# Patient Record
Sex: Female | Born: 1940 | Race: Black or African American | Hispanic: No | Smoking: Never smoker
Health system: Southern US, Community
[De-identification: ages and names within clinical notes are randomized; demographics above are authoritative.]

## PROBLEM LIST (undated history)

## (undated) DIAGNOSIS — I10 Essential (primary) hypertension: Secondary | ICD-10-CM

## (undated) DIAGNOSIS — E119 Type 2 diabetes mellitus without complications: Secondary | ICD-10-CM

## (undated) DIAGNOSIS — G629 Polyneuropathy, unspecified: Secondary | ICD-10-CM

## (undated) DIAGNOSIS — M8008XA Age-related osteoporosis with current pathological fracture, vertebra(e), initial encounter for fracture: Secondary | ICD-10-CM

## (undated) DIAGNOSIS — I7300 Raynaud's syndrome without gangrene: Secondary | ICD-10-CM

## (undated) DIAGNOSIS — M329 Systemic lupus erythematosus, unspecified: Secondary | ICD-10-CM

## (undated) DIAGNOSIS — J45909 Unspecified asthma, uncomplicated: Secondary | ICD-10-CM

## (undated) HISTORY — PX: ABDOMINAL HYSTERECTOMY: SHX81

## (undated) HISTORY — DX: Essential (primary) hypertension: I10

## (undated) HISTORY — DX: Polyneuropathy, unspecified: G62.9

## (undated) HISTORY — DX: Age-related osteoporosis with current pathological fracture, vertebra(e), initial encounter for fracture: M80.08XA

## (undated) HISTORY — DX: Type 2 diabetes mellitus without complications: E11.9

## (undated) HISTORY — PX: TONSILLECTOMY: SUR1361

---

## 2012-08-01 DIAGNOSIS — E119 Type 2 diabetes mellitus without complications: Secondary | ICD-10-CM | POA: Insufficient documentation

## 2012-08-01 DIAGNOSIS — I7300 Raynaud's syndrome without gangrene: Secondary | ICD-10-CM | POA: Insufficient documentation

## 2012-08-01 DIAGNOSIS — E785 Hyperlipidemia, unspecified: Secondary | ICD-10-CM | POA: Insufficient documentation

## 2012-08-01 DIAGNOSIS — M329 Systemic lupus erythematosus, unspecified: Secondary | ICD-10-CM | POA: Insufficient documentation

## 2012-08-01 DIAGNOSIS — M25559 Pain in unspecified hip: Secondary | ICD-10-CM | POA: Insufficient documentation

## 2012-08-01 DIAGNOSIS — M21619 Bunion of unspecified foot: Secondary | ICD-10-CM | POA: Insufficient documentation

## 2012-08-01 DIAGNOSIS — R1110 Vomiting, unspecified: Secondary | ICD-10-CM | POA: Insufficient documentation

## 2012-08-01 DIAGNOSIS — H409 Unspecified glaucoma: Secondary | ICD-10-CM | POA: Insufficient documentation

## 2012-08-01 DIAGNOSIS — H269 Unspecified cataract: Secondary | ICD-10-CM | POA: Insufficient documentation

## 2012-08-01 DIAGNOSIS — M25569 Pain in unspecified knee: Secondary | ICD-10-CM | POA: Insufficient documentation

## 2012-08-01 DIAGNOSIS — I10 Essential (primary) hypertension: Secondary | ICD-10-CM | POA: Insufficient documentation

## 2012-08-01 DIAGNOSIS — R112 Nausea with vomiting, unspecified: Secondary | ICD-10-CM | POA: Insufficient documentation

## 2012-08-01 DIAGNOSIS — R350 Frequency of micturition: Secondary | ICD-10-CM | POA: Insufficient documentation

## 2012-08-01 DIAGNOSIS — K449 Diaphragmatic hernia without obstruction or gangrene: Secondary | ICD-10-CM | POA: Insufficient documentation

## 2012-08-01 DIAGNOSIS — J45909 Unspecified asthma, uncomplicated: Secondary | ICD-10-CM | POA: Insufficient documentation

## 2012-08-01 DIAGNOSIS — R197 Diarrhea, unspecified: Secondary | ICD-10-CM | POA: Insufficient documentation

## 2014-05-11 ENCOUNTER — Telehealth: Payer: Self-pay | Admitting: Neurology

## 2014-05-11 NOTE — Telephone Encounter (Signed)
Spoke with Arnette SchaumannWillie--she would like an appt. to see RAS regarding back pain.  Appt. given for 05-17-14 at 1320/fim

## 2014-05-11 NOTE — Telephone Encounter (Signed)
Patient is calling. Patient states she wants to ask you something but would not go into detail. Thank you.

## 2014-05-17 ENCOUNTER — Ambulatory Visit (INDEPENDENT_AMBULATORY_CARE_PROVIDER_SITE_OTHER): Payer: Medicare Other | Admitting: Neurology

## 2014-05-17 ENCOUNTER — Encounter: Payer: Self-pay | Admitting: Neurology

## 2014-05-17 VITALS — BP 108/56 | HR 64 | Resp 16 | Ht 63.0 in | Wt 184.8 lb

## 2014-05-17 DIAGNOSIS — M5442 Lumbago with sciatica, left side: Secondary | ICD-10-CM

## 2014-05-17 DIAGNOSIS — M25512 Pain in left shoulder: Secondary | ICD-10-CM | POA: Diagnosis not present

## 2014-05-17 DIAGNOSIS — Z794 Long term (current) use of insulin: Secondary | ICD-10-CM

## 2014-05-17 DIAGNOSIS — M545 Low back pain, unspecified: Secondary | ICD-10-CM | POA: Insufficient documentation

## 2014-05-17 DIAGNOSIS — M25511 Pain in right shoulder: Secondary | ICD-10-CM | POA: Insufficient documentation

## 2014-05-17 DIAGNOSIS — M5430 Sciatica, unspecified side: Secondary | ICD-10-CM | POA: Diagnosis not present

## 2014-05-17 DIAGNOSIS — S32009S Unspecified fracture of unspecified lumbar vertebra, sequela: Secondary | ICD-10-CM | POA: Diagnosis not present

## 2014-05-17 DIAGNOSIS — S32009A Unspecified fracture of unspecified lumbar vertebra, initial encounter for closed fracture: Secondary | ICD-10-CM | POA: Insufficient documentation

## 2014-05-17 DIAGNOSIS — E119 Type 2 diabetes mellitus without complications: Secondary | ICD-10-CM | POA: Insufficient documentation

## 2014-05-17 DIAGNOSIS — G4700 Insomnia, unspecified: Secondary | ICD-10-CM | POA: Diagnosis not present

## 2014-05-17 DIAGNOSIS — M5441 Lumbago with sciatica, right side: Secondary | ICD-10-CM | POA: Diagnosis not present

## 2014-05-17 MED ORDER — DOXEPIN HCL 10 MG PO CAPS
10.0000 mg | ORAL_CAPSULE | Freq: Every day | ORAL | Status: DC
Start: 2014-05-17 — End: 2020-03-26

## 2014-05-17 MED ORDER — HYDROCODONE-ACETAMINOPHEN 7.5-325 MG PO TABS: 7.5000 | ORAL_TABLET | Freq: Three times a day (TID) | ORAL | Status: DC | PRN

## 2014-05-17 MED ORDER — GABAPENTIN 300 MG PO CAPS: ORAL_CAPSULE | ORAL | Status: DC

## 2014-05-17 NOTE — Progress Notes (Signed)
[facility]  PATIENT: Kerry Brooks DOB: 11-Aug-1940  REFERRING DOCTOR OR PCP:  Brooke Bonito  SOURCE: patient  _________________________________   HISTORICAL  CHIEF COMPLAINT:  Chief Complaint  Patient presents with  . Back Pain    Dx. with compression fx. last year. Unable to recall what level.  She saw Dr. Ala Dach to discuss v'plasty, but decided against it after Dr. Ala Dach told her the mri indicated fx. was healing.  Sts. lower back pain radiating into both buttocks and down posterior aspect of both legs to knees (right worse than left), has been worse over the last month. She just finished Cipro  bid for a uti/fim  . Vertebral Fracture    HISTORY OF PRESENT ILLNESS:  She is a Kerry Brooks is a 74 yo woman with LBP and a recent vertebral fracture.  LBP:  She has a long history of lower back pain right > left.  Pain mostly in the buttocks    Pain will radiate down the posterior legs, especially on the right.   Standing increases the pain.    Resting, sitting reduces the pain. Gabapentin and hydrocodone have helped the pain in the past.    She had acute onset of severe back pain last fall and was found to have a vertebral body fracture.   She started feeling better around the time she was being evaluated for vertebroplasty (Dr. Ala Dach) and opted not to proceed.   Over the next month, pain improved much better and she was left with just the 'old pain' she has had for more years  Left arm/ shoulder pain:   She has had pain in the axilla and arm is sore in deltoid region of shoulder and feels a little weak.   She denies hand pain.   No neck pain.    This pain started several months ago and was worse last month but is still bothering her.     She also had a h/o CTS on that side in the past.     Knee pain:   She sees Dr. Dareen Piano, rheumatology and has had fluid removed from her knee several times  Insomnia:   She reports sleep maintenance more than sleep onset  insomnia, worsening over the past few months.      She takes gabapentin at bedtime.     REVIEW OF SYSTEMS: Constitutional: No fevers, chills, sweats, or change in appetite Eyes: No visual changes, double vision, eye pain Ear, nose and throat: No hearing loss, ear pain, nasal congestion, sore throat Cardiovascular: No chest pain, palpitations Respiratory: No shortness of breath at rest or with exertion.   No wheezes GastrointestinaI: No nausea, vomiting, diarrhea, abdominal pain, fecal incontinence Genitourinary: No dysuria, urinary retention or frequency.  No nocturia. Musculoskeletal: No neck pain.  Notes left shoulder pain and back pain Integumentary: No rash, pruritus, skin lesions Neurological: as above Psychiatric: No depression at this time.  No anxiety Endocrine: No palpitations, diaphoresis, change in appetite, change in weigh or increased thirst Hematologic/Lymphatic: No anemia, purpura, petechiae. Allergic/Immunologic: No itchy/runny eyes, nasal congestion, recent allergic reactions, rashes  ALLERGIES: Allergies  Allergen Reactions  . Propoxyphene     HOME MEDICATIONS:  Current outpatient prescriptions:  .  aspirin EC 81 MG tablet, Take 81 mg by mouth daily., Disp: , Rfl:  .  brinzolamide (AZOPT) 1 % ophthalmic suspension, Frequency:   Dosage:0.0     Instructions:  Note:, Disp: , Rfl:  .  budesonide-formoterol (SYMBICORT) 160-4.5 MCG/ACT inhaler,  Frequency:   Dosage:0.0     Instructions:  Note:, Disp: , Rfl:  .  carvedilol (COREG) 25 MG tablet, Take by mouth., Disp: , Rfl:  .  Cyanocobalamin (VITAMIN B-12 CR) 1000 MCG TBCR, Frequency:   Dosage:0.0     Instructions:  Note:, Disp: , Rfl:  .  diclofenac sodium (VOLTAREN) 1 % GEL, Frequency:   Dosage:0.0     Instructions:  Note:, Disp: , Rfl:  .  Ergocalciferol (VITAMIN D2) 2000 UNITS TABS, Frequency:   Dosage:0     Instructions:  Note:take 5000 iu everyday, Disp: , Rfl:  .  fluticasone (FLONASE) 50 MCG/ACT nasal spray,  Frequency:   Dosage:0.0     Instructions:  Note:, Disp: , Rfl:  .  gabapentin (NEURONTIN) 300 MG capsule, , Disp: , Rfl:  .  glimepiride (AMARYL) 4 MG tablet, Take by mouth., Disp: , Rfl:  .  HYDROcodone-acetaminophen (NORCO) 7.5-325 MG per tablet, Take 7.5 tablets by mouth 3 (three) times daily as needed., Disp: , Rfl:  .  insulin glargine (LANTUS) 100 UNIT/ML injection, Inject into the skin., Disp: , Rfl:  .  losartan-hydrochlorothiazide (HYZAAR) 100-12.5 MG per tablet, Take by mouth., Disp: , Rfl:  .  metFORMIN (GLUCOPHAGE) 500 MG tablet, Take by mouth., Disp: , Rfl:  .  Multiple Vitamin (MULTIVITAMIN) capsule, Take by mouth., Disp: , Rfl:  .  pantoprazole (PROTONIX) 40 MG tablet, Take 40 mg by mouth., Disp: , Rfl:  .  potassium chloride (K-DUR) 10 MEQ tablet, Take by mouth., Disp: , Rfl:  .  pravastatin (PRAVACHOL) 40 MG tablet, Take by mouth., Disp: , Rfl:  .  predniSONE (DELTASONE) 5 MG tablet, Take by mouth., Disp: , Rfl:   PAST MEDICAL HISTORY: Past Medical History  Diagnosis Date  . Hypertension   . Vertebral fracture, osteoporotic   . Neuropathy   . Diabetes mellitus without complication     PAST SURGICAL HISTORY: History reviewed. No pertinent past surgical history.  FAMILY HISTORY: History reviewed. No pertinent family history.  SOCIAL HISTORY:  History   Social History  . Marital Status: Divorced    Spouse Name: N/A  . Number of Children: N/A  . Years of Education: N/A   Occupational History  . Not on file.   Social History Main Topics  . Smoking status: Never Smoker   . Smokeless tobacco: Not on file  . Alcohol Use: No  . Drug Use: No  . Sexual Activity: Not on file   Other Topics Concern  . Not on file   Social History Narrative  . No narrative on file     PHYSICAL EXAM  Filed Vitals:   05/17/14 1322  BP: 108/56  Pulse: 64  Resp: 16  Height: 5\' 3"  (1.6 m)  Weight: 184 lb 12.8 oz (83.825 kg)    Body mass index is 32.74  kg/(m^2).   General: The patient is well-developed and well-nourished and in no acute distress  Neck: The neck is supple, no carotid bruits are noted.  The neck is nontender.  Cardiovascular: The heart has a regular rate and rhythm with a normal S1 and S2. There were no murmurs, gallops or rubs.   Skin: Extremities are without significant edema.  Musculoskeletal:  Back is tender over both piriformis muscles > lumbar paraspinals.  Tender over left subacromial bursa and ext rotation of shoulder increases pain  Neurologic Exam  Mental status: The patient is alert and oriented x 3 at the time of the examination. The patient has  apparent normal recent and remote memory, with an apparently normal attention span and concentration ability.   Speech is normal.  Cranial nerves: Extraocular movements are full.   Facial symmetry is present. There is good facial sensation to soft touch bilaterally.Facial strength is normal.  Trapezius and sternocleidomastoid strength is normal. No dysarthria is noted.  The tongue is midline, and the patient has symmetric elevation of the soft palate. No obvious hearing deficits are noted.  Motor:  Muscle bulk is normal.   Tone is normal. Strength is  5 / 5 in all 4 extremities.   Sensory: Sensory testing is intact to touch and vibration sensation in all 4 extremities.  Coordination: Cerebellar testing reveals good finger-nose-finge.  Gait and station: Station is normal.   Gait is normal. Tandem gait is normal. Romberg is negative.   Reflexes: Deep tendon reflexes are symmetric and 1 in arms and absent in legs bilaterally.      DIAGNOSTIC DATA (LABS, IMAGING, TESTING) - I reviewed patient records, labs, notes, testing and imaging myself where available.     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 In the past, she has benefited from trigger point injections and I will inject both of the piriformis muscles. A total of 50 mg depot Medrol in 5 mL Marcaine. Additionally, she has after subacromial bursitis. The left subacromial bursa was injected 30 mg of Depo-Medrol and Marcaine using sterile technique. She tolerated the procedure well.   I will also hydrocodone and gabapentin. She also reports insomnia which has worsened over the last year. She is more an issue with sleep maintenance. I will have her try doxepin 10 mg nightly to see if that can help.  She will return to see me in 3 - 4 months or sooner if she has new or worsening neurologic symptoms.   Kerry Brooks A. Epimenio Foot, MD, PhD 05/17/2014, 1:37 PM Certified in Neurology, Clinical Neurophysiology, Sleep Medicine, Pain Medicine and Neuroimaging  [facility] [address], [address] [place], [place] [address] [phone]

## 2014-05-19 ENCOUNTER — Telehealth: Payer: Self-pay | Admitting: Neurology

## 2014-05-19 NOTE — Telephone Encounter (Signed)
Patient is calling because [facility] [address] [facility] [place] states the patient needs prior authorization for medication doxepin (SINEQUAN) 10 MG capsule. Please call patient and advise. Thank you.

## 2014-05-19 NOTE — Telephone Encounter (Signed)
Patient is calling regarding

## 2014-05-19 NOTE — Telephone Encounter (Signed)
I contacted the pharmacy to obtain Rx Ins info.  [facility] with [facility] ID # [id number].  I contacted ins and provided all clinical info.  Request is currently under review.  Ref Key: [id number].  I called the patient back to advise.  Got no answer.  Left message.

## 2014-05-23 ENCOUNTER — Telehealth: Payer: Self-pay

## 2014-05-23 NOTE — Telephone Encounter (Signed)
[employer]  has approved the request for coverage on Doxepin effective until 02/25/2015 Ref # [id number]. Called patient to advise, got no answer.  Left message.

## 2014-08-05 ENCOUNTER — Telehealth: Payer: Self-pay | Admitting: Neurology

## 2014-08-05 NOTE — Telephone Encounter (Signed)
I called back.  Spoke with [facility] Citizen.  Verified Rx.  They will proceed with order.

## 2014-08-05 NOTE — Telephone Encounter (Signed)
[name] with [facility] is calling to clarify directions on Rx HYDROcodone-acetaminophen (NORCO) 7.5-325 MG per tablet that was written on 05-17-14 for the patient.Thank you.

## 2014-09-14 ENCOUNTER — Encounter: Payer: Self-pay | Admitting: Neurology

## 2014-09-14 ENCOUNTER — Ambulatory Visit (INDEPENDENT_AMBULATORY_CARE_PROVIDER_SITE_OTHER): Payer: Medicare Other | Admitting: Neurology

## 2014-09-14 VITALS — BP 174/86 | HR 68 | Resp 16 | Ht 63.0 in | Wt 184.0 lb

## 2014-09-14 DIAGNOSIS — G4700 Insomnia, unspecified: Secondary | ICD-10-CM | POA: Diagnosis not present

## 2014-09-14 DIAGNOSIS — M25512 Pain in left shoulder: Secondary | ICD-10-CM | POA: Diagnosis not present

## 2014-09-14 DIAGNOSIS — M5430 Sciatica, unspecified side: Secondary | ICD-10-CM

## 2014-09-14 MED ORDER — HYDROCODONE-ACETAMINOPHEN 7.5-325 MG PO TABS: 7.5000 | ORAL_TABLET | Freq: Three times a day (TID) | ORAL | Status: DC | PRN

## 2014-09-14 NOTE — Progress Notes (Signed)
[facility]  PATIENT: Kerry Brooks DOB: Mar 19, 1940  REFERRING DOCTOR OR PCP:  Kerry Brooks  SOURCE: patient  _________________________________   HISTORICAL  CHIEF COMPLAINT:  Chief Complaint  Patient presents with  . Back Pain    Sts. lbp has been worse--radiating down both legs, still worse in the right leg.  She denies neck pain but sts. she has had more left shoulder pain./fim    HISTORY OF PRESENT ILLNESS:  She is a Kerry Brooks is a 74 yo woman with LBP and a recent vertebral fracture.  LBP:  The lower back pain has returned and is right > left, mostly in the buttocks    Pain radiates down the posterior legs, especially on the right.   Standing increases the pain.    Resting, sitting reduces the pain. Gabapentin and hydrocodone have helped the pain in the past.     Left arm/ shoulder pain:   She has had pain in the axilla and shoulder blade region of shoulder and arm feels a little weak.   She denies hand or lower arm pain.   No neck pain.    Subacromial bursa shot only helped a little bit.   Knee pain:   She sees Dr. Dareen Piano, rheumatology and has had fluid removed from her knee several times  Mood/Insomnia:   She has more stress (one brother with metastatic lung ca and another with other health issues).   She reports some sleep maintenance   insomnia, worsening over the past few months.      She takes gabapentin at bedtime.   She has only taken doxepin a few times.    REVIEW OF SYSTEMS: Constitutional: No fevers, chills, sweats, or change in appetite Eyes: No visual changes, double vision, eye pain Ear, nose and throat: No hearing loss, ear pain, nasal congestion, sore throat Cardiovascular: No chest pain, palpitations Respiratory: No shortness of breath at rest or with exertion.   No wheezes GastrointestinaI: No nausea, vomiting, diarrhea, abdominal pain, fecal incontinence Genitourinary: No dysuria, urinary retention or frequency.  No  nocturia. Musculoskeletal: No neck pain.  Notes left shoulder pain and back pain Integumentary: No rash, pruritus, skin lesions Neurological: as above Psychiatric: No depression at this time.  No anxiety Endocrine: No palpitations, diaphoresis, change in appetite, change in weigh or increased thirst Hematologic/Lymphatic: No anemia, purpura, petechiae. Allergic/Immunologic: No itchy/runny eyes, nasal congestion, recent allergic reactions, rashes  ALLERGIES: Allergies  Allergen Reactions  . Propoxyphene     HOME MEDICATIONS:  Current outpatient prescriptions:  .  aspirin EC 81 MG tablet, Take 81 mg by mouth daily., Disp: , Rfl:  .  brinzolamide (AZOPT) 1 % ophthalmic suspension, Frequency:   Dosage:0.0     Instructions:  Note:, Disp: , Rfl:  .  carvedilol (COREG) 25 MG tablet, Take by mouth., Disp: , Rfl:  .  Cyanocobalamin (VITAMIN B-12 CR) 1000 MCG TBCR, Frequency:   Dosage:0.0     Instructions:  Note:, Disp: , Rfl:  .  doxepin (SINEQUAN) 10 MG capsule, Take 1 capsule (10 mg total) by mouth at bedtime., Disp: 305 capsule, Rfl: 5 .  Ergocalciferol (VITAMIN D2) 2000 UNITS TABS, Frequency:   Dosage:0     Instructions:  Note:take 5000 iu everyday, Disp: , Rfl:  .  fluticasone (FLONASE) 50 MCG/ACT nasal spray, Frequency:   Dosage:0.0     Instructions:  Note:, Disp: , Rfl:  .  gabapentin (NEURONTIN) 300 MG capsule, One in am, one in pm and 2  at night, Disp: 360 capsule, Rfl: 3 .  HYDROcodone-acetaminophen (NORCO) 7.5-325 MG per tablet, Take 7.5 tablets by mouth 3 (three) times daily as needed., Disp: 90 tablet, Rfl: 0 .  insulin glargine (LANTUS) 100 UNIT/ML injection, Inject into the skin., Disp: , Rfl:  .  losartan-hydrochlorothiazide (HYZAAR) 100-12.5 MG per tablet, Take by mouth., Disp: , Rfl:  .  metFORMIN (GLUCOPHAGE) 500 MG tablet, Take by mouth., Disp: , Rfl:  .  Multiple Vitamin (MULTIVITAMIN) capsule, Take by mouth., Disp: , Rfl:  .  pantoprazole (PROTONIX) 40 MG tablet, Take 40  mg by mouth., Disp: , Rfl:  .  potassium chloride (K-DUR) 10 MEQ tablet, Take by mouth., Disp: , Rfl:  .  pravastatin (PRAVACHOL) 40 MG tablet, Take by mouth., Disp: , Rfl:  .  predniSONE (DELTASONE) 5 MG tablet, Take by mouth., Disp: , Rfl:  .  budesonide-formoterol (SYMBICORT) 160-4.5 MCG/ACT inhaler, Frequency:   Dosage:0.0     Instructions:  Note:, Disp: , Rfl:  .  diclofenac sodium (VOLTAREN) 1 % GEL, Frequency:   Dosage:0.0     Instructions:  Note:, Disp: , Rfl:  .  glimepiride (AMARYL) 4 MG tablet, Take by mouth., Disp: , Rfl:   PAST MEDICAL HISTORY: Past Medical History  Diagnosis Date  . Hypertension   . Vertebral fracture, osteoporotic   . Neuropathy   . Diabetes mellitus without complication     PAST SURGICAL HISTORY: History reviewed. No pertinent past surgical history.  FAMILY HISTORY: History reviewed. No pertinent family history.  SOCIAL HISTORY:  History   Social History  . Marital Status: Divorced    Spouse Name: N/A  . Number of Children: N/A  . Years of Education: N/A   Occupational History  . Not on file.   Social History Main Topics  . Smoking status: Never Smoker   . Smokeless tobacco: Not on file  . Alcohol Use: No  . Drug Use: No  . Sexual Activity: Not on file   Other Topics Concern  . Not on file   Social History Narrative     PHYSICAL EXAM  Filed Vitals:   09/14/14 1045  BP: 174/86  Pulse: 68  Resp: 16  Height:  (1.6 m)  Weight: 184 lb (83.462 kg)    Body mass index is 32.6 kg/(m^2).   General: The patient is well-developed and well-nourished and in no acute distress  Neck: The neck is supple, no carotid bruits are noted.  The neck is nontender.  Musculoskeletal:  Back is tender over both piriformis muscles > lumbar paraspinals.  Tender over left subacromial bursa and ext rotation of shoulder increases pain  Neurologic Exam  Mental status: The patient is alert and oriented x 3 at the time of the examination. The  patient has apparent normal recent and remote memory, with an apparently normal attention span and concentration ability.   Speech is normal.  Cranial nerves: Extraocular movements are full.   Facial symmetry is present.   Trapezius and sternocleidomastoid strength is normal. No dysarthria is noted.     Motor:  Muscle bulk is normal.   Tone is normal. Strength is  5 / 5 in all 4 extremities.   Sensory: Sensory testing is intact to touch  in all 4 extremities.  Gait and station: Station is normal.   Gait is normal. Tandem gait is normal.    Reflexes: Deep tendon reflexes are symmetric and 1 in arms and absent in legs bilaterally.  DIAGNOSTIC DATA (LABS, IMAGING, TESTING) - I reviewed patient records, labs, notes, testing and imaging myself where available.     ASSESSMENT AND PLAN  Sciatica, unspecified laterality  Pain in joint, shoulder region, left  Insomnia    1.  Trigger point inject  both of the piriformis muscles woth a total of 50 mg depo Medrol in 5 mL Marcaine.  2.  Left subacromial bursa was injected with 30 mg of Depo-Medrol in Marcaine using sterile technique. She tolerated the procedure well.    3.  I will also refill hydrocodone and gabapentin.  4.   She will return to see me in 3 - 4 months or sooner if she has new or worsening neurologic symptoms.   Leondre Taul A. Epimenio FootSater, MD, PhD 09/14/2014, 10:54 AM Certified in Neurology, Clinical Neurophysiology, Sleep Medicine, Pain Medicine and Neuroimaging  [facility] [address], [address] [place], [place] [address] [phone]

## 2014-11-03 ENCOUNTER — Encounter (HOSPITAL_BASED_OUTPATIENT_CLINIC_OR_DEPARTMENT_OTHER): Payer: Self-pay | Admitting: *Deleted

## 2014-11-03 ENCOUNTER — Emergency Department (HOSPITAL_BASED_OUTPATIENT_CLINIC_OR_DEPARTMENT_OTHER)
Admission: EM | Admit: 2014-11-03 | Discharge: 2014-11-03 | Disposition: A | Discharge status: Home / Self Care | Payer: Medicare Other | Attending: Emergency Medicine | Admitting: Emergency Medicine

## 2014-11-03 DIAGNOSIS — E119 Type 2 diabetes mellitus without complications: Secondary | ICD-10-CM | POA: Insufficient documentation

## 2014-11-03 DIAGNOSIS — I10 Essential (primary) hypertension: Secondary | ICD-10-CM | POA: Diagnosis not present

## 2014-11-03 DIAGNOSIS — Z8781 Personal history of (healed) traumatic fracture: Secondary | ICD-10-CM | POA: Diagnosis not present

## 2014-11-03 DIAGNOSIS — M545 Low back pain, unspecified: Secondary | ICD-10-CM

## 2014-11-03 DIAGNOSIS — G629 Polyneuropathy, unspecified: Secondary | ICD-10-CM | POA: Insufficient documentation

## 2014-11-03 DIAGNOSIS — Z7982 Long term (current) use of aspirin: Secondary | ICD-10-CM | POA: Diagnosis not present

## 2014-11-03 LAB — URINALYSIS, ROUTINE W REFLEX MICROSCOPIC
BILIRUBIN URINE: NEGATIVE
Glucose, UA: NEGATIVE mg/dL
HGB URINE DIPSTICK: NEGATIVE
KETONES UR: NEGATIVE mg/dL
Leukocytes, UA: NEGATIVE
NITRITE: NEGATIVE
Protein, ur: NEGATIVE mg/dL
Specific Gravity, Urine: 1.019 (ref 1.005–1.030)
UROBILINOGEN UA: 0.2 mg/dL (ref 0.0–1.0)
pH: 5.5 (ref 5.0–8.0)

## 2014-11-03 MED ORDER — HYDROMORPHONE HCL 1 MG/ML IJ SOLN
1.0000 mg | Freq: Once | INTRAMUSCULAR | Status: AC
  Administered 2014-11-03: 1 mg via INTRAMUSCULAR
  Filled 2014-11-03: qty 1

## 2014-11-03 MED ORDER — CYCLOBENZAPRINE HCL 10 MG PO TABS: 10.0000 mg | ORAL_TABLET | Freq: Two times a day (BID) | ORAL | Status: DC | PRN

## 2014-11-03 NOTE — ED Notes (Signed)
Lower back pain x 3 days. Recent diarrhea that has resolved.

## 2014-11-03 NOTE — ED Notes (Signed)
Patient C/O of right sided back pain x3 days that starts at shoulder and radiates down towards sacral region. Pt has existing back conditions followed by patients primary HCP.

## 2014-11-03 NOTE — ED Provider Notes (Signed)
CSN: [id number]     Arrival date & time 11/03/14  2014 History   This chart was scribed for Mirian Mo, MD by Doreatha Martin, ED Scribe. This patient was seen in room MH10/MH10 and the patient's care was started at 9:54 PM.    Chief Complaint  Patient presents with  . Back Pain   Patient is a 74 y.o. female presenting with back pain. The history is provided by the patient. No language interpreter was used.  Back Pain Location:  Generalized Radiates to:  Does not radiate Pain severity:  Moderate Onset quality:  Gradual Duration:  3 days Progression:  Worsening Chronicity:  Chronic Context: not falling, not jumping from heights, not lifting heavy objects, not physical stress, not recent illness and not recent injury   Relieved by: Hydrocodone  Worsened by:  Deep breathing, lying down, movement and ambulation Associated symptoms: no bladder incontinence, no bowel incontinence, no dysuria, no fever, no numbness, no perianal numbness, no tingling and no weakness     HPI Comments: Kerry Brooks is a 74 y.o. female with Hx of chronic back pain who presents to the Emergency Department complaining of moderate generalized back pain onset 3 days ago and worsened today with associated nausea. Pt states that pain is worsened with breathing, lying flat, movement and mildly relieved with Hydrocodone. Pt reports that she normally gets trigger point injections for her chronic pain from Dr. Leilani Merl. Pt notes a recent UTI a few weeks ago. She states she has one pill left from her antibiotic. Pt denies recent trauma, injury, heavy lifting, falls. She also denies fever, cough, congestion, sore throat, vomiting, dysuria, malodorous urine, numbness or tingling in the BLE, bowel or bladder incontinence worsened from baseline, saddle anesthesia, focal weakness.   Past Medical History  Diagnosis Date  . Hypertension   . Vertebral fracture, osteoporotic   . Neuropathy   . Diabetes mellitus without complication     History reviewed. No pertinent past surgical history. No family history on file. Social History  Substance Use Topics  . Smoking status: Never Smoker   . Smokeless tobacco: None  . Alcohol Use: No   OB History    No data available     Review of Systems  Constitutional: Negative for fever.  HENT: Negative for congestion and sore throat.   Respiratory: Negative for cough.   Gastrointestinal: Positive for nausea. Negative for vomiting and bowel incontinence.  Genitourinary: Negative for bladder incontinence and dysuria.  Musculoskeletal: Positive for back pain.  Neurological: Negative for tingling, weakness and numbness.  All other systems reviewed and are negative.  Allergies  Propoxyphene  Home Medications   Prior to Admission medications   Medication Sig Start Date End Date Taking? Authorizing Provider  aspirin EC 81 MG tablet Take 81 mg by mouth daily. 09/20/11   Historical Provider, MD  brinzolamide (AZOPT) 1 % ophthalmic suspension Frequency:   Dosage:0.0     Instructions:  Note: 09/20/11   Historical Provider, MD  budesonide-formoterol (SYMBICORT) 160-4.5 MCG/ACT inhaler Frequency:   Dosage:0.0     Instructions:  Note: 09/20/11   Historical Provider, MD  carvedilol (COREG) 25 MG tablet Take by mouth. 09/20/11   Historical Provider, MD  Cyanocobalamin (VITAMIN B-12 CR) 1000 MCG TBCR Frequency:   Dosage:0.0     Instructions:  Note: 09/20/11   Historical Provider, MD  cyclobenzaprine (FLEXERIL) 10 MG tablet Take 1 tablet (10 mg total) by mouth 2 (two) times daily as needed for muscle spasms. 11/03/14  Mirian Mo, MD  diclofenac sodium (VOLTAREN) 1 % GEL Frequency:   Dosage:0.0     Instructions:  Note: 12/06/11   Historical Provider, MD  doxepin (SINEQUAN) 10 MG capsule Take 1 capsule (10 mg total) by mouth at bedtime. 05/17/14   Asa Lente, MD  Ergocalciferol (VITAMIN D2) 2000 UNITS TABS Frequency:   Dosage:0     Instructions:  Note:take 5000 iu everyday 12/06/11    Historical Provider, MD  fluticasone (FLONASE) 50 MCG/ACT nasal spray Frequency:   Dosage:0.0     Instructions:  Note: 09/20/11   Historical Provider, MD  gabapentin (NEURONTIN) 300 MG capsule One in am, one in pm and 2 at night 05/17/14   Asa Lente, MD  glimepiride (AMARYL) 4 MG tablet Take by mouth. 09/20/11   Historical Provider, MD  HYDROcodone-acetaminophen (NORCO) 7.5-325 MG per tablet Take 7.5 tablets by mouth 3 (three) times daily as needed. 09/14/14   Asa Lente, MD  insulin glargine (LANTUS) 100 UNIT/ML injection Inject into the skin.    Historical Provider, MD  losartan-hydrochlorothiazide (HYZAAR) 100-12.5 MG per tablet Take by mouth. 05/01/10   Historical Provider, MD  metFORMIN (GLUCOPHAGE) 500 MG tablet Take by mouth. 09/20/11   Historical Provider, MD  Multiple Vitamin (MULTIVITAMIN) capsule Take by mouth. 09/20/11   Historical Provider, MD  pantoprazole (PROTONIX) 40 MG tablet Take 40 mg by mouth.    Historical Provider, MD  potassium chloride (K-DUR) 10 MEQ tablet Take by mouth. 05/01/10   Historical Provider, MD  pravastatin (PRAVACHOL) 40 MG tablet Take by mouth.    Historical Provider, MD  predniSONE (DELTASONE) 5 MG tablet Take by mouth. 09/20/11   Historical Provider, MD   BP 110/79 mmHg  Pulse 83  Temp(Src) 98.2 F (36.8 C) (Oral)  Resp 18  Ht 5' 3.5" (1.613 m)  Wt 184 lb (83.462 kg)  BMI 32.08 kg/m2  SpO2 98% Physical Exam  Constitutional: She is oriented to person, place, and time. She appears well-developed and well-nourished.  HENT:  Head: Normocephalic and atraumatic.  Right Ear: External ear normal.  Left Ear: External ear normal.  Eyes: Conjunctivae and EOM are normal. Pupils are equal, round, and reactive to light.  Neck: Normal range of motion. Neck supple.  Cardiovascular: Normal rate, regular rhythm, normal heart sounds and intact distal pulses.   Pulmonary/Chest: Effort normal and breath sounds normal.  Abdominal: Soft. Bowel sounds are normal.  There is no tenderness.  Musculoskeletal: Normal range of motion.       Lumbar back: She exhibits tenderness and bony tenderness. She exhibits normal range of motion.  Neurological: She is alert and oriented to person, place, and time.  No focal neuro deficits distal to hips  Skin: Skin is warm and dry.  Vitals reviewed.   ED Course  Procedures (including critical care time) DIAGNOSTIC STUDIES: Oxygen Saturation is 100% on RA, normal by my interpretation.    COORDINATION OF CARE: 10:02 PM Discussed treatment plan with pt at bedside and pt agreed to plan.   Labs Review Labs Reviewed  URINALYSIS, ROUTINE W REFLEX MICROSCOPIC (NOT AT [facility])    Imaging Review No results found. I have personally reviewed and evaluated these images and lab results as part of my medical decision-making.   EKG Interpretation None      MDM   Final diagnoses:  Bilateral low back pain without sciatica    74 y.o. female with pertinent PMH of chronic back pain, DM presents with acute on chronic atraumatic  back pain.  Exam consistent with MSK back pain, no signs of sciatica on exam.  NV intact. No abd pain.  UA unremarkable.  DC home with muscle relaxant.  I have reviewed all laboratory and imaging studies if ordered as above  1. Bilateral low back pain without sciatica           Mirian Mo, MD 11/04/14 [phone]

## 2014-11-03 NOTE — Discharge Instructions (Signed)
Back Pain, Adult Low back pain is very common. About 1 in 5 people have back pain.The cause of low back pain is rarely dangerous. The pain often gets better over time.About half of people with a sudden onset of back pain feel better in just 2 weeks. About 8 in 10 people feel better by 6 weeks.  CAUSES Some common causes of back pain include:  Strain of the muscles or ligaments supporting the spine.  Wear and tear (degeneration) of the spinal discs.  Arthritis.  Direct injury to the back. DIAGNOSIS Most of the time, the direct cause of low back pain is not known.However, back pain can be treated effectively even when the exact cause of the pain is unknown.Answering your caregiver's questions about your overall health and symptoms is one of the most accurate ways to make sure the cause of your pain is not dangerous. If your caregiver needs more information, he or she may order lab work or imaging tests (X-rays or MRIs).However, even if imaging tests show changes in your back, this usually does not require surgery. HOME CARE INSTRUCTIONS For many people, back pain returns.Since low back pain is rarely dangerous, it is often a condition that people can learn to manageon their own.   Remain active. It is stressful on the back to sit or stand in one place. Do not sit, drive, or stand in one place for more than 30 minutes at a time. Take short walks on level surfaces as soon as pain allows.Try to increase the length of time you walk each day.  Do not stay in bed.Resting more than 1 or 2 days can delay your recovery.  Do not avoid exercise or work.Your body is made to move.It is not dangerous to be active, even though your back may hurt.Your back will likely heal faster if you return to being active before your pain is gone.  Pay attention to your body when you bend and lift. Many people have less discomfortwhen lifting if they bend their knees, keep the load close to their bodies,and  avoid twisting. Often, the most comfortable positions are those that put less stress on your recovering back.  Find a comfortable position to sleep. Use a firm mattress and lie on your side with your knees slightly bent. If you lie on your back, put a pillow under your knees.  Only take over-the-counter or prescription medicines as directed by your caregiver. Over-the-counter medicines to reduce pain and inflammation are often the most helpful.Your caregiver may prescribe muscle relaxant drugs.These medicines help dull your pain so you can more quickly return to your normal activities and healthy exercise.  Put ice on the injured area.  Put ice in a plastic bag.  Place a towel between your skin and the bag.  Leave the ice on for 15-20 minutes, 03-04 times a day for the first 2 to 3 days. After that, ice and heat may be alternated to reduce pain and spasms.  Ask your caregiver about trying back exercises and gentle massage. This may be of some benefit.  Avoid feeling anxious or stressed.Stress increases muscle tension and can worsen back pain.It is important to recognize when you are anxious or stressed and learn ways to manage it.Exercise is a great option. SEEK MEDICAL CARE IF:  You have pain that is not relieved with rest or medicine.  You have pain that does not improve in 1 week.  You have new symptoms.  You are generally not feeling well. SEEK   IMMEDIATE MEDICAL CARE IF:   You have pain that radiates from your back into your legs.  You develop new bowel or bladder control problems.  You have unusual weakness or numbness in your arms or legs.  You develop nausea or vomiting.  You develop abdominal pain.  You feel faint. Document Released: 02/11/2005 Document Revised: 08/13/2011 Document Reviewed: 06/15/2013 ExitCare Patient Information 2015 [employer]. This information is not intended to replace advice given to you by your health care provider. Make sure you  discuss any questions you have with your health care provider.  

## 2014-11-03 NOTE — ED Notes (Signed)
Room air stat 99% with hr 95 bpm at [facility]

## 2014-11-10 ENCOUNTER — Telehealth: Payer: Self-pay | Admitting: Neurology

## 2014-11-10 NOTE — Telephone Encounter (Signed)
[facility] , [phone] ext: [phone]. They need clarification on rx HYDROcodone-acetaminophen (NORCO) 7.5-325 MG per tablet. Thank you

## 2014-11-10 NOTE — Telephone Encounter (Signed)
I called back.  Got no answer at the extension provided.  Was transferred to a secure VM.  The Rx appears to have instructions of 7.5 tablets three times daily, I assume this is the clarification needed, as 7.5 is the strength, not the number of tablets.  I left a message with this info and asked that they call back if any additional info is needed.

## 2014-12-15 ENCOUNTER — Ambulatory Visit: Payer: Medicare Other | Admitting: Neurology

## 2014-12-16 ENCOUNTER — Encounter: Payer: Self-pay | Admitting: Neurology

## 2014-12-16 ENCOUNTER — Ambulatory Visit (INDEPENDENT_AMBULATORY_CARE_PROVIDER_SITE_OTHER): Payer: Medicare Other | Admitting: Neurology

## 2014-12-16 VITALS — BP 124/62 | HR 68 | Resp 16 | Ht 63.0 in | Wt 184.0 lb

## 2014-12-16 DIAGNOSIS — M25561 Pain in right knee: Secondary | ICD-10-CM

## 2014-12-16 DIAGNOSIS — E119 Type 2 diabetes mellitus without complications: Secondary | ICD-10-CM | POA: Insufficient documentation

## 2014-12-16 DIAGNOSIS — M5442 Lumbago with sciatica, left side: Secondary | ICD-10-CM

## 2014-12-16 DIAGNOSIS — G4700 Insomnia, unspecified: Secondary | ICD-10-CM

## 2014-12-16 DIAGNOSIS — M5441 Lumbago with sciatica, right side: Secondary | ICD-10-CM | POA: Diagnosis not present

## 2014-12-16 DIAGNOSIS — G8929 Other chronic pain: Secondary | ICD-10-CM

## 2014-12-16 DIAGNOSIS — M25512 Pain in left shoulder: Secondary | ICD-10-CM

## 2014-12-16 MED ORDER — HYDROCODONE-ACETAMINOPHEN 7.5-325 MG PO TABS: 7.5000 | ORAL_TABLET | Freq: Three times a day (TID) | ORAL | Status: DC | PRN

## 2014-12-16 NOTE — Progress Notes (Signed)
[facility]  PATIENT: Kerry Brooks DOB: 02/13/1941  REFERRING DOCTOR OR PCP:  Brooke Bonito  SOURCE: patient  _________________________________   HISTORICAL  CHIEF COMPLAINT:  Chief Complaint  Patient presents with  . Back Pain    Sts. lbp has been worse for the last month or so.  Sts. she was seen at [facility] on [address], also seen by pcp--sts. given rx. for ?muscle relaxer that helps some.  Sts. left shoulder pain radiating to left hand continues to be worse./fim  . Left shoulder pain  . Insomnia    HISTORY OF PRESENT ILLNESS:  She is a Kerry Brooks is a 74 yo woman with LBP and a recent vertebral fracture.  LBP:  The lower back pain is bothering her a lot more again.   She went to the [facility] ED in [facility].  She was given a dilaudid shot and placed on Flexeril.   Her benefit was only 12 hours.   The piriformis trigger point injections help x 1 month or so. Current pain right > left, mostly in the buttocks    Pain radiates down the right posterior leg.   Standing increases the pain.    Resting, sitting reduces the pain. Gabapentin and hydrocodone have helped the pain in the past.     Left arm/ shoulder pain:   She has had pain in the left shoulder blade region of shoulder.   She denies hand or lower arm pain.   No neck pain.    Subacromial bursa have helped some in the past.     Knee pain:   She sees Dr. Dareen Piano, rheumatology and has had fluid removed from her knee several times  Mood/Insomnia:   She is sleeping better since starting Doxepin and she tolerates it well  She also takes gabapentin at bedtime.        REVIEW OF SYSTEMS: Constitutional: No fevers, chills, sweats, or change in appetite Eyes: No visual changes, double vision, eye pain Ear, nose and throat: No hearing loss, ear pain, nasal congestion, sore throat Cardiovascular: No chest pain, palpitations Respiratory: No shortness of breath at rest or with exertion.   No  wheezes GastrointestinaI: No nausea, vomiting, diarrhea, abdominal pain, fecal incontinence Genitourinary: No dysuria, urinary retention or frequency.  No nocturia. Musculoskeletal: No neck pain.  Notes left shoulder pain and back pain Integumentary: No rash, pruritus, skin lesions Neurological: as above Psychiatric: No depression at this time.  No anxiety Endocrine: No palpitations, diaphoresis, change in appetite, change in weigh or increased thirst Hematologic/Lymphatic: No anemia, purpura, petechiae. Allergic/Immunologic: No itchy/runny eyes, nasal congestion, recent allergic reactions, rashes  ALLERGIES: Allergies  Allergen Reactions  . Propoxyphene     HOME MEDICATIONS:  Current outpatient prescriptions:  .  aspirin EC 81 MG tablet, Take 81 mg by mouth daily., Disp: , Rfl:  .  brinzolamide (AZOPT) 1 % ophthalmic suspension, Frequency:   Dosage:0.0     Instructions:  Note:, Disp: , Rfl:  .  budesonide-formoterol (SYMBICORT) 160-4.5 MCG/ACT inhaler, Frequency:   Dosage:0.0     Instructions:  Note:, Disp: , Rfl:  .  carvedilol (COREG) 25 MG tablet, Take by mouth., Disp: , Rfl:  .  Cyanocobalamin (VITAMIN B-12 CR) 1000 MCG TBCR, Frequency:   Dosage:0.0     Instructions:  Note:, Disp: , Rfl:  .  cyclobenzaprine (FLEXERIL) 10 MG tablet, Take 1 tablet (10 mg total) by mouth 2 (two) times daily as needed for muscle spasms., Disp:  20 tablet, Rfl: 0 .  doxepin (SINEQUAN) 10 MG capsule, Take 1 capsule (10 mg total) by mouth at bedtime., Disp: 305 capsule, Rfl: 5 .  Ergocalciferol (VITAMIN D2) 2000 UNITS TABS, Frequency:   Dosage:0     Instructions:  Note:take 5000 iu everyday, Disp: , Rfl:  .  fluticasone (FLONASE) 50 MCG/ACT nasal spray, Frequency:   Dosage:0.0     Instructions:  Note:, Disp: , Rfl:  .  gabapentin (NEURONTIN) 300 MG capsule, One in am, one in pm and 2 at night, Disp: 360 capsule, Rfl: 3 .  glimepiride (AMARYL) 4 MG tablet, Take by mouth., Disp: , Rfl:  .   HYDROcodone-acetaminophen (NORCO) 7.5-325 MG per tablet, Take 7.5 tablets by mouth 3 (three) times daily as needed., Disp: 90 tablet, Rfl: 0 .  insulin glargine (LANTUS) 100 UNIT/ML injection, Inject into the skin., Disp: , Rfl:  .  losartan-hydrochlorothiazide (HYZAAR) 100-12.5 MG per tablet, Take by mouth., Disp: , Rfl:  .  metFORMIN (GLUCOPHAGE) 500 MG tablet, Take by mouth., Disp: , Rfl:  .  Multiple Vitamin (MULTIVITAMIN) capsule, Take by mouth., Disp: , Rfl:  .  pantoprazole (PROTONIX) 40 MG tablet, Take 40 mg by mouth., Disp: , Rfl:  .  potassium chloride (K-DUR) 10 MEQ tablet, Take by mouth., Disp: , Rfl:  .  pravastatin (PRAVACHOL) 40 MG tablet, Take by mouth., Disp: , Rfl:  .  predniSONE (DELTASONE) 5 MG tablet, Take by mouth., Disp: , Rfl:  .  diclofenac sodium (VOLTAREN) 1 % GEL, Frequency:   Dosage:0.0     Instructions:  Note:, Disp: , Rfl:  .  PROCTOSOL HC 2.5 % rectal cream, INSERT 1 APPLICATORFUL RECTALLY AT BEDTIME AS NEEDED., Disp: , Rfl: 0  PAST MEDICAL HISTORY: Past Medical History  Diagnosis Date  . Hypertension   . Vertebral fracture, osteoporotic (HCC)   . Neuropathy (HCC)   . Diabetes mellitus without complication (HCC)     PAST SURGICAL HISTORY: History reviewed. No pertinent past surgical history.  FAMILY HISTORY: History reviewed. No pertinent family history.  SOCIAL HISTORY:  Social History   Social History  . Marital Status: Divorced    Spouse Name: N/A  . Number of Children: N/A  . Years of Education: N/A   Occupational History  . Not on file.   Social History Main Topics  . Smoking status: Never Smoker   . Smokeless tobacco: Not on file  . Alcohol Use: No  . Drug Use: No  . Sexual Activity: Not on file   Other Topics Concern  . Not on file   Social History Narrative     PHYSICAL EXAM  Filed Vitals:   12/16/14 1214  BP: 124/62  Pulse: 68  Resp: 16  Height: 5\' 3"  (1.6 m)  Weight: 184 lb (83.462 kg)    Body mass index is  32.6 kg/(m^2).   General: The patient is well-developed and well-nourished and in no acute distress  Neck: The neck is supple, no carotid bruits are noted.  The neck is nontender.  Musculoskeletal:  Back is tender over both piriformis muscles > lumbar paraspinals.  Tender over left AC joint and ext rotation of shoulder increases pain  Neurologic Exam  Mental status: The patient is alert and oriented x 3 at the time of the examination. The patient has apparent normal recent and remote memory, with an apparently normal attention span and concentration ability.   Speech is normal.  Cranial nerves: Extraocular movements are full.   Facial symmetry  is present.   Trapezius and sternocleidomastoid strength is normal. No dysarthria is noted.     Motor:  Muscle bulk is normal.   Tone is normal. Strength is  5 / 5 in all 4 extremities.   Sensory: Sensory testing is intact to touch  in all 4 extremities.  Gait and station: Station is normal.   Gait is normal. Tandem gait is normal.    Reflexes: Deep tendon reflexes are symmetric and 1 in arms and absent in legs bilaterally.      DIAGNOSTIC DATA (LABS, IMAGING, TESTING) - I reviewed patient records, labs, notes, testing and imaging myself where available.     ASSESSMENT AND PLAN  Chronic bilateral low back pain with bilateral sciatica  Pain in joint of left shoulder  Insomnia  Arthralgia of lower leg, right    1.  Trigger point inject  both of the piriformis muscles and the L5 paraspinal muscles with a total of 3 mg Decadron in 8 mL Marcaine.  2.  Left shpoulder AC joint was injected with 1 mg of Decadron in 1.5 cc Marcaine using sterile technique. She tolerated the procedure well.    3.  I will also refill hydrocodone and gabapentin.  4.   She will return to see me in 3 - 4 months or sooner if she has new or worsening neurologic symptoms.   Richard A. Epimenio Foot, MD, PhD 12/16/2014, 12:19 PM Certified in Neurology, Clinical  Neurophysiology, Sleep Medicine, Pain Medicine and Neuroimaging  [facility] [address], [address] [place], [place] [address] [phone]

## 2015-02-03 ENCOUNTER — Telehealth: Payer: Self-pay | Admitting: Neurology

## 2015-02-03 DIAGNOSIS — M25512 Pain in left shoulder: Secondary | ICD-10-CM

## 2015-02-03 MED ORDER — CYCLOBENZAPRINE HCL 5 MG PO TABS: 5.0000 mg | ORAL_TABLET | Freq: Three times a day (TID) | ORAL | Status: DC | PRN

## 2015-02-03 NOTE — Telephone Encounter (Signed)
Pt called sts she barely lift her left arm, pain in side of neck, down arm and into armpit. She is inquiring if a muscle relaxer or whatever Dr Epimenio FootSater thinks. Please call and advise

## 2015-02-03 NOTE — Telephone Encounter (Signed)
I have spoken with [name] again this morning and advised that RAS would like to refer her to ortho, as she received little benefit from left shoulder inj. on 12-16-14.  He will also give her Flexeril 5mg  po tid prn for pain.  She verbalized understanding of same, is agreeable with this plan.  Rx. escribed to [facility] [place] per her request, and referral ordered, requested to [facility] as this is closer to her home./fim

## 2015-02-03 NOTE — Telephone Encounter (Signed)
Pt called and states she is doing great

## 2015-02-03 NOTE — Telephone Encounter (Signed)
I have spoken with [name] again--she had no further questions/fim

## 2015-02-15 DIAGNOSIS — M545 Low back pain: Secondary | ICD-10-CM | POA: Insufficient documentation

## 2015-02-15 DIAGNOSIS — E538 Deficiency of other specified B group vitamins: Secondary | ICD-10-CM | POA: Insufficient documentation

## 2015-02-15 DIAGNOSIS — K219 Gastro-esophageal reflux disease without esophagitis: Secondary | ICD-10-CM | POA: Insufficient documentation

## 2015-02-15 DIAGNOSIS — R809 Proteinuria, unspecified: Secondary | ICD-10-CM | POA: Insufficient documentation

## 2015-02-15 DIAGNOSIS — E11319 Type 2 diabetes mellitus with unspecified diabetic retinopathy without macular edema: Secondary | ICD-10-CM | POA: Insufficient documentation

## 2015-02-15 DIAGNOSIS — M5441 Lumbago with sciatica, right side: Secondary | ICD-10-CM | POA: Insufficient documentation

## 2015-02-15 DIAGNOSIS — M353 Polymyalgia rheumatica: Secondary | ICD-10-CM | POA: Insufficient documentation

## 2015-02-15 DIAGNOSIS — J309 Allergic rhinitis, unspecified: Secondary | ICD-10-CM | POA: Insufficient documentation

## 2015-02-15 DIAGNOSIS — G8929 Other chronic pain: Secondary | ICD-10-CM | POA: Insufficient documentation

## 2015-02-15 DIAGNOSIS — I10 Essential (primary) hypertension: Secondary | ICD-10-CM | POA: Insufficient documentation

## 2015-02-15 DIAGNOSIS — M47812 Spondylosis without myelopathy or radiculopathy, cervical region: Secondary | ICD-10-CM | POA: Insufficient documentation

## 2015-02-15 DIAGNOSIS — J4530 Mild persistent asthma, uncomplicated: Secondary | ICD-10-CM | POA: Insufficient documentation

## 2015-04-25 ENCOUNTER — Ambulatory Visit: Payer: Medicare Other | Admitting: Neurology

## 2015-04-26 ENCOUNTER — Ambulatory Visit (INDEPENDENT_AMBULATORY_CARE_PROVIDER_SITE_OTHER): Payer: Medicare Other | Admitting: Neurology

## 2015-04-26 ENCOUNTER — Encounter: Payer: Self-pay | Admitting: Neurology

## 2015-04-26 VITALS — BP 124/70 | HR 74 | Resp 18 | Ht 63.0 in | Wt 181.0 lb

## 2015-04-26 DIAGNOSIS — M353 Polymyalgia rheumatica: Secondary | ICD-10-CM

## 2015-04-26 DIAGNOSIS — M5441 Lumbago with sciatica, right side: Secondary | ICD-10-CM

## 2015-04-26 DIAGNOSIS — M25512 Pain in left shoulder: Secondary | ICD-10-CM

## 2015-04-26 DIAGNOSIS — G4700 Insomnia, unspecified: Secondary | ICD-10-CM | POA: Diagnosis not present

## 2015-04-26 DIAGNOSIS — M5442 Lumbago with sciatica, left side: Secondary | ICD-10-CM

## 2015-04-26 MED ORDER — HYDROCODONE-ACETAMINOPHEN 7.5-325 MG PO TABS: 7.5000 | ORAL_TABLET | Freq: Three times a day (TID) | ORAL | Status: DC | PRN

## 2015-04-26 MED ORDER — GABAPENTIN 300 MG PO CAPS: ORAL_CAPSULE | ORAL | Status: DC

## 2015-04-26 MED ORDER — DICLOFENAC SODIUM 1 % TD GEL: 2.0000 g | Freq: Three times a day (TID) | TRANSDERMAL | Status: DC

## 2015-04-26 NOTE — Progress Notes (Signed)
[facility]  PATIENT: Kerry Brooks DOB: 07/08/1940  REFERRING DOCTOR OR PCP:  Brooke Bonito  SOURCE: patient  _________________________________   HISTORICAL  CHIEF COMPLAINT:  Chief Complaint  Patient presents with  . Back Pain    Sts. lower back pain has been worse but is not radiating into legs at this time.  Sts. left shoulder pain is worse.  She saw Dr. Wyline Mood at [facility]; sts. was told her shoulder is too bad for surgery. Kerry Brooks  . Left Shoulder Pain    HISTORY OF PRESENT ILLNESS:  She is a Kerry Brooks is a 75 yo woman with LBP and a recent vertebral fracture.  LBP:  Her LBP/buttock pain is flaring back up again.   The piriformis trigger point injections help x 1 month or so. Current pain is bilateral, mostly in the buttocks    Pain radiates down the right posterior leg.   Standing increases the pain.    Resting, sitting reduces the pain. Gabapentin and hydrocodone have helped the pain in the past.     Left arm/ shoulder pain:   She has had pain in the left shoulder.   She saw Dr. Wyline Mood and he felt she was not a surgical candidate.   She denies hand or lower arm pain.   No neck pain.    Subacromial bursa have helped some in the past.     Right foot pain:   She has recent tendinitis and saw Dr. Romualdo Bolk (Podiatry) and had an injection  Knee pain:   She sees rheumatology and has had fluid removed from her knee several times., most recently Dr. Hubbard Robinson in [place].     Mood/Insomnia:   She is sleeping better since starting Doxepin and she tolerates it well  She also takes gabapentin at bedtime.        REVIEW OF SYSTEMS: Constitutional: No fevers, chills, sweats, or change in appetite Eyes: No visual changes, double vision, eye pain Ear, nose and throat: No hearing loss, ear pain, nasal congestion, sore throat Cardiovascular: No chest pain, palpitations Respiratory: No shortness of breath at rest or with exertion.   No  wheezes GastrointestinaI: No nausea, vomiting, diarrhea, abdominal pain, fecal incontinence Genitourinary: No dysuria, urinary retention or frequency.  No nocturia. Musculoskeletal: No neck pain.  Notes left shoulder pain and back pain Integumentary: No rash, pruritus, skin lesions Neurological: as above Psychiatric: No depression at this time.  No anxiety Endocrine: No palpitations, diaphoresis, change in appetite, change in weigh or increased thirst Hematologic/Lymphatic: No anemia, purpura, petechiae. Allergic/Immunologic: No itchy/runny eyes, nasal congestion, recent allergic reactions, rashes  ALLERGIES: Allergies  Allergen Reactions  . Canagliflozin Other (See Comments)    Other reaction(s): Other (See Comments) unknown unknown  . Propoxyphene Other (See Comments)    unknown    HOME MEDICATIONS:  Current outpatient prescriptions:  .  aspirin EC 81 MG tablet, Take 81 mg by mouth daily., Disp: , Rfl:  .  brinzolamide (AZOPT) 1 % ophthalmic suspension, Frequency:   Dosage:0.0     Instructions:  Note:, Disp: , Rfl:  .  budesonide-formoterol (SYMBICORT) 160-4.5 MCG/ACT inhaler, Frequency:   Dosage:0.0     Instructions:  Note:, Disp: , Rfl:  .  carvedilol (COREG) 25 MG tablet, Take by mouth., Disp: , Rfl:  .  Cyanocobalamin (VITAMIN B-12 CR) 1000 MCG TBCR, Frequency:   Dosage:0.0     Instructions:  Note:, Disp: , Rfl:  .  cyclobenzaprine (FLEXERIL) 5 MG tablet, Take  1 tablet (5 mg total) by mouth 3 (three) times daily as needed for muscle spasms., Disp: 90 tablet, Rfl: 0 .  diclofenac sodium (VOLTAREN) 1 % GEL, Frequency:   Dosage:0.0     Instructions:  Note:, Disp: , Rfl:  .  doxepin (SINEQUAN) 10 MG capsule, Take 1 capsule (10 mg total) by mouth at bedtime., Disp: 305 capsule, Rfl: 5 .  Ergocalciferol (VITAMIN D2) 2000 UNITS TABS, Frequency:   Dosage:0     Instructions:  Note:take 5000 iu everyday, Disp: , Rfl:  .  fluticasone (FLONASE) 50 MCG/ACT nasal spray, Frequency:    Dosage:0.0     Instructions:  Note:, Disp: , Rfl:  .  gabapentin (NEURONTIN) 300 MG capsule, One in am, one in pm and 2 at night, Disp: 360 capsule, Rfl: 3 .  glimepiride (AMARYL) 4 MG tablet, Take by mouth., Disp: , Rfl:  .  HYDROcodone-acetaminophen (NORCO) 7.5-325 MG tablet, Take 7.5 tablets by mouth 3 (three) times daily as needed., Disp: 90 tablet, Rfl: 0 .  insulin glargine (LANTUS) 100 UNIT/ML injection, Inject into the skin., Disp: , Rfl:  .  losartan-hydrochlorothiazide (HYZAAR) 100-12.5 MG per tablet, Take by mouth., Disp: , Rfl:  .  metFORMIN (GLUCOPHAGE) 500 MG tablet, Take by mouth., Disp: , Rfl:  .  Multiple Vitamin (MULTIVITAMIN) capsule, Take by mouth., Disp: , Rfl:  .  pantoprazole (PROTONIX) 40 MG tablet, Take 40 mg by mouth., Disp: , Rfl:  .  potassium chloride (K-DUR) 10 MEQ tablet, Take by mouth., Disp: , Rfl:  .  pravastatin (PRAVACHOL) 40 MG tablet, Take by mouth., Disp: , Rfl:  .  predniSONE (DELTASONE) 5 MG tablet, Take by mouth., Disp: , Rfl:  .  PROCTOSOL HC 2.5 % rectal cream, INSERT 1 APPLICATORFUL RECTALLY AT BEDTIME AS NEEDED., Disp: , Rfl: 0  PAST MEDICAL HISTORY: Past Medical History  Diagnosis Date  . Hypertension   . Vertebral fracture, osteoporotic (HCC)   . Neuropathy (HCC)   . Diabetes mellitus without complication (HCC)     PAST SURGICAL HISTORY: History reviewed. No pertinent past surgical history.  FAMILY HISTORY: History reviewed. No pertinent family history.  SOCIAL HISTORY:  Social History   Social History  . Marital Status: Divorced    Spouse Name: N/A  . Number of Children: N/A  . Years of Education: N/A   Occupational History  . Not on file.   Social History Main Topics  . Smoking status: Never Smoker   . Smokeless tobacco: Not on file  . Alcohol Use: No  . Drug Use: No  . Sexual Activity: Not on file   Other Topics Concern  . Not on file   Social History Narrative     PHYSICAL EXAM  Filed Vitals:   04/26/15  1111  BP: 124/70  Pulse: 74  Resp: 18  Height:  (1.6 m)  Weight: 181 lb (82.101 kg)    Body mass index is 32.07 kg/(m^2).   General: The patient is well-developed and well-nourished and in no acute distress  Neck: The neck is supple, no carotid bruits are noted.  The neck is nontender.  Musculoskeletal:  Back is tender over both piriformis muscles > lumbar paraspinals.  Tender over left AC joint and ext rotation of shoulder increases pain  Neurologic Exam  Mental status: The patient is alert and oriented x 3 at the time of the examination. The patient has apparent normal recent and remote memory, with an apparently normal attention span and concentration ability.  Speech is normal.  Cranial nerves: Extraocular movements are full.   Facial symmetry is present.   Trapezius and sternocleidomastoid strength is normal. No dysarthria is noted.     Motor:  Muscle bulk is normal.   Tone is normal. Strength is  5 / 5 in all 4 extremities.   Sensory: Sensory testing is intact to touch  in all 4 extremities.  Gait and station: Station is normal.   Gait is normal. Tandem gait is normal.    Reflexes: Deep tendon reflexes are symmetric and 1 in arms and absent in legs bilaterally.      DIAGNOSTIC DATA (LABS, IMAGING, TESTING) - I reviewed patient records, labs, notes, testing and imaging myself where available.     ASSESSMENT AND PLAN  Bilateral low back pain with sciatica, sciatica laterality unspecified  Pain in joint of left shoulder  Polymyalgia rheumatica (HCC)  Insomnia    1.  Trigger point inject  both of the piriformis muscles and the L5 paraspinal muscles with 60 mg depo-medrol in 6 mL Marcaine.  2.  Left shoulder AC joint was injected with 20 mg Depo-Medrol in 2 ml Marcaine using sterile technique. She tolerated the procedure well.    3.  Refill hydrocodone and gabapentin.  4.   She will return to see me in 3 - 4 months or sooner if she has new or worsening  neurologic symptoms.   Zell Doucette A. Epimenio Foot, MD, PhD 04/26/2015, 11:57 AM Certified in Neurology, Clinical Neurophysiology, Sleep Medicine, Pain Medicine and Neuroimaging  [facility] [address], [address] [place], [place] [address] [phone]

## 2015-06-19 DIAGNOSIS — M25569 Pain in unspecified knee: Secondary | ICD-10-CM | POA: Insufficient documentation

## 2015-06-20 DIAGNOSIS — R300 Dysuria: Secondary | ICD-10-CM | POA: Insufficient documentation

## 2015-06-20 DIAGNOSIS — Z79899 Other long term (current) drug therapy: Secondary | ICD-10-CM | POA: Insufficient documentation

## 2015-08-22 DIAGNOSIS — M2040 Other hammer toe(s) (acquired), unspecified foot: Secondary | ICD-10-CM | POA: Insufficient documentation

## 2015-08-22 DIAGNOSIS — M2010 Hallux valgus (acquired), unspecified foot: Secondary | ICD-10-CM | POA: Insufficient documentation

## 2015-08-24 ENCOUNTER — Ambulatory Visit (INDEPENDENT_AMBULATORY_CARE_PROVIDER_SITE_OTHER): Payer: Medicare Other | Admitting: Neurology

## 2015-08-24 ENCOUNTER — Encounter: Payer: Self-pay | Admitting: Neurology

## 2015-08-24 VITALS — BP 118/62 | HR 68 | Resp 18 | Ht 63.0 in | Wt 189.5 lb

## 2015-08-24 DIAGNOSIS — M353 Polymyalgia rheumatica: Secondary | ICD-10-CM

## 2015-08-24 DIAGNOSIS — M25512 Pain in left shoulder: Secondary | ICD-10-CM | POA: Diagnosis not present

## 2015-08-24 DIAGNOSIS — M5441 Lumbago with sciatica, right side: Secondary | ICD-10-CM | POA: Diagnosis not present

## 2015-08-24 DIAGNOSIS — M5430 Sciatica, unspecified side: Secondary | ICD-10-CM

## 2015-08-24 DIAGNOSIS — M5442 Lumbago with sciatica, left side: Secondary | ICD-10-CM | POA: Diagnosis not present

## 2015-08-24 MED ORDER — HYDROCODONE-ACETAMINOPHEN 7.5-325 MG PO TABS: 7.5000 | ORAL_TABLET | Freq: Three times a day (TID) | ORAL | Status: DC | PRN

## 2015-08-24 NOTE — Progress Notes (Signed)
[facility]  PATIENT: Kerry Brooks DOB: 1940-08-29  REFERRING DOCTOR OR PCP:  Kerry Brooks  SOURCE: patient  _________________________________   HISTORICAL  CHIEF COMPLAINT:  Chief Complaint  Patient presents with  . Back Pain    Sts. lbp has been worse, occasionally radiating down both legs, left worse than right. Also sts. Kerry Brooks is having more bilat. knee pain.  Kerry Brooks is on Prednisone 10mg  po qd right now, rx'd by pcp/fim  . Left Shoulder Pain  . Bilateral Knee Pain    HISTORY OF PRESENT ILLNESS:  Kerry Brooks is a Kerry Brooks is a 75 yo woman with LBP, left shoulder pain and knee pain.  LBP:  Her LBP/buttock pain improved after piriformis muscle injections in March 2017.  Over the last few weeks, pan is increasing again.  Current pain is bilateral, mostly in the buttocks    Pain radiates down the right posterior leg.   Standing increases the pain.    Resting, sitting reduces the pain. Gabapentin and hydrocodone have helped the pain in the past.   Kerry Brooks takes 1-2 hydrocodones a day if pain flares up  Left arm/ shoulder pain:   Kerry Brooks reports pain in the left shoulder. Kerry Brooks denies hand or lower arm pain.   Kerry Brooks seldom has neck pain.     Subacromial bursa have helped in the past when pain was worse.  Kerry Brooks saw Dr. Wyline Brooks and he felt Kerry Brooks was not a surgical candidate.      PMR/Knee pain:   Kerry Brooks sees rheumatology and has had fluid removed from her knee several times., most recently Dr. Hubbard Brooks in [place].   Kerry Brooks is on prednisone for the PMR   Mood/Insomnia:   Kerry Brooks is sleeping better since starting Doxepin and Kerry Brooks tolerates it well  Kerry Brooks also takes gabapentin at bedtime.        REVIEW OF SYSTEMS: Constitutional: No fevers, chills, sweats, or change in appetite Eyes: No visual changes, double vision, eye pain Ear, nose and throat: No hearing loss, ear pain, nasal congestion, sore throat Cardiovascular: No chest pain, palpitations Respiratory: No shortness of breath at rest or  with exertion.   No wheezes GastrointestinaI: No nausea, vomiting, diarrhea, abdominal pain, fecal incontinence Genitourinary: No dysuria, urinary retention or frequency.  No nocturia. Musculoskeletal: No neck pain.  Notes left shoulder pain and back pain Integumentary: No rash, pruritus, skin lesions Neurological: as above Psychiatric: No depression at this time.  No anxiety Endocrine: No palpitations, diaphoresis, change in appetite, change in weigh or increased thirst Hematologic/Lymphatic: No anemia, purpura, petechiae. Allergic/Immunologic: No itchy/runny eyes, nasal congestion, recent allergic reactions, rashes  ALLERGIES: Allergies  Allergen Reactions  . Canagliflozin Other (See Comments)    Other reaction(s): Other (See Comments) unknown unknown  . Propoxyphene Other (See Comments)    unknown    HOME MEDICATIONS:  Current outpatient prescriptions:  .  aspirin EC 81 MG tablet, Take 81 mg by mouth daily., Disp: , Rfl:  .  brinzolamide (AZOPT) 1 % ophthalmic suspension, Frequency:   Dosage:0.0     Instructions:  Note:, Disp: , Rfl:  .  budesonide-formoterol (SYMBICORT) 160-4.5 MCG/ACT inhaler, Frequency:   Dosage:0.0     Instructions:  Note:, Disp: , Rfl:  .  carvedilol (COREG) 25 MG tablet, Take by mouth., Disp: , Rfl:  .  Cyanocobalamin (VITAMIN B-12 CR) 1000 MCG TBCR, Frequency:   Dosage:0.0     Instructions:  Note:, Disp: , Rfl:  .  cyclobenzaprine (FLEXERIL) 5 MG tablet, Take  1 tablet (5 mg total) by mouth 3 (three) times daily as needed for muscle spasms., Disp: 90 tablet, Rfl: 0 .  diclofenac sodium (VOLTAREN) 1 % GEL, Apply 2 g topically 3 (three) times daily. Frequency:   Dosage:0.0     Instructions:  Note:, Disp: 400 g, Rfl: 3 .  doxepin (SINEQUAN) 10 MG capsule, Take 1 capsule (10 mg total) by mouth at bedtime., Disp: 305 capsule, Rfl: 5 .  Ergocalciferol (VITAMIN D2) 2000 UNITS TABS, Frequency:   Dosage:0     Instructions:  Note:take 5000 iu everyday, Disp: , Rfl:   .  fluticasone (FLONASE) 50 MCG/ACT nasal spray, Frequency:   Dosage:0.0     Instructions:  Note:, Disp: , Rfl:  .  gabapentin (NEURONTIN) 300 MG capsule, One in am, one in pm and 2 at night, Disp: 360 capsule, Rfl: 3 .  glimepiride (AMARYL) 4 MG tablet, Take by mouth., Disp: , Rfl:  .  HYDROcodone-acetaminophen (NORCO) 7.5-325 MG tablet, Take 7.5 tablets by mouth 3 (three) times daily as needed., Disp: 90 tablet, Rfl: 0 .  insulin glargine (LANTUS) 100 UNIT/ML injection, Inject into the skin., Disp: , Rfl:  .  losartan-hydrochlorothiazide (HYZAAR) 100-12.5 MG per tablet, Take by mouth., Disp: , Rfl:  .  metFORMIN (GLUCOPHAGE) 500 MG tablet, Take by mouth., Disp: , Rfl:  .  Multiple Vitamin (MULTIVITAMIN) capsule, Take by mouth., Disp: , Rfl:  .  pantoprazole (PROTONIX) 40 MG tablet, Take 40 mg by mouth., Disp: , Rfl:  .  potassium chloride (K-DUR) 10 MEQ tablet, Take by mouth., Disp: , Rfl:  .  pravastatin (PRAVACHOL) 40 MG tablet, Take by mouth., Disp: , Rfl:  .  predniSONE (DELTASONE) 5 MG tablet, Take by mouth., Disp: , Rfl:  .  PROCTOSOL HC 2.5 % rectal cream, INSERT 1 APPLICATORFUL RECTALLY AT BEDTIME AS NEEDED., Disp: , Rfl: 0  PAST MEDICAL HISTORY: Past Medical History  Diagnosis Date  . Hypertension   . Vertebral fracture, osteoporotic (HCC)   . Neuropathy (HCC)   . Diabetes mellitus without complication (HCC)     PAST SURGICAL HISTORY: History reviewed. No pertinent past surgical history.  FAMILY HISTORY: History reviewed. No pertinent family history.  SOCIAL HISTORY:  Social History   Social History  . Marital Status: Divorced    Spouse Name: N/A  . Number of Children: N/A  . Years of Education: N/A   Occupational History  . Not on file.   Social History Main Topics  . Smoking status: Never Smoker   . Smokeless tobacco: Not on file  . Alcohol Use: No  . Drug Use: No  . Sexual Activity: Not on file   Other Topics Concern  . Not on file   Social History  Narrative     PHYSICAL EXAM  Filed Vitals:   08/24/15 1059  BP: 118/62  Pulse: 68  Resp: 18  Height: 5\' 3"  (1.6 m)  Weight: 189 lb 8 oz (85.957 kg)    Body mass index is 33.58 kg/(m^2).   General: The patient is well-developed and well-nourished and in no acute distress  Neck: The neck is supple   The neck is nontender.  Musculoskeletal:  Back is tender over both piriformis muscles and lower lumbar paraspinals.    Neurologic Exam  Mental status: The patient is alert and oriented x 3 at the time of the examination. The patient has apparent normal recent and remote memory, with an apparently normal attention span and concentration ability.   Speech  is normal.  Cranial nerves: Extraocular movements are full.   Facial symmetry is present.   Trapezius and sternocleidomastoid strength is normal. No dysarthria is noted.     Motor:  Muscle bulk is normal.   Tone is normal. Strength is  5 / 5 in all 4 extremities.   Sensory: Sensory testing is intact to touch  in all 4 extremities.  Gait and station: Station is normal.   Gait is normal for age. Tandem gait is mildly wide.    Reflexes: Deep tendon reflexes are symmetric and 1 in arms and absent in legs bilaterally.      DIAGNOSTIC DATA (LABS, IMAGING, TESTING) - I reviewed patient records, labs, notes, testing and imaging myself where available.     ASSESSMENT AND PLAN  Bilateral low back pain with sciatica, sciatica laterality unspecified  Sciatica, unspecified laterality  Pain in joint of left shoulder  Polymyalgia rheumatica (HCC)    1.  Trigger point inject  both of the piriformis muscles and the L4L5 paraspinal muscles with 60 mg depo-medrol in 6 mL Marcaine.  2.   Refill hydrocodone and gabapentin.  3   Try to stay active and exercise 4.   Kerry Brooks will return to see me in 3 - 4 months or sooner if Kerry Brooks has new or worsening neurologic symptoms.   Ulysee Fyock A. Epimenio Foot, MD, PhD 08/24/2015, 11:12 AM Certified in  Neurology, Clinical Neurophysiology, Sleep Medicine, Pain Medicine and Neuroimaging  [facility] [address], [address] [place], [place] [address] [phone]

## 2016-01-02 ENCOUNTER — Ambulatory Visit (INDEPENDENT_AMBULATORY_CARE_PROVIDER_SITE_OTHER): Payer: Medicare Other | Admitting: Neurology

## 2016-01-02 ENCOUNTER — Encounter: Payer: Self-pay | Admitting: Neurology

## 2016-01-02 VITALS — BP 174/86 | HR 78 | Resp 18 | Ht 63.0 in | Wt 196.5 lb

## 2016-01-02 DIAGNOSIS — G4700 Insomnia, unspecified: Secondary | ICD-10-CM

## 2016-01-02 DIAGNOSIS — M329 Systemic lupus erythematosus, unspecified: Secondary | ICD-10-CM | POA: Diagnosis not present

## 2016-01-02 DIAGNOSIS — M5432 Sciatica, left side: Secondary | ICD-10-CM

## 2016-01-02 DIAGNOSIS — R32 Unspecified urinary incontinence: Secondary | ICD-10-CM | POA: Diagnosis not present

## 2016-01-02 DIAGNOSIS — M5431 Sciatica, right side: Secondary | ICD-10-CM | POA: Diagnosis not present

## 2016-01-02 MED ORDER — HYDROCODONE-ACETAMINOPHEN 7.5-325 MG PO TABS: 1.0000 | ORAL_TABLET | Freq: Three times a day (TID) | ORAL | 0 refills | Status: DC | PRN

## 2016-01-02 MED ORDER — GABAPENTIN 300 MG PO CAPS: ORAL_CAPSULE | ORAL | 3 refills | Status: DC

## 2016-01-02 NOTE — Progress Notes (Signed)
[facility]  PATIENT: Kerry Brooks DOB: 07/01/1940  REFERRING DOCLamount CrankerOR OR PCP:  Brooke BonitoWarren Gallemore  SOURCE: patient  _________________________________   HISTORICAL  CHIEF COMPLAINT:  Chief Complaint  Patient presents with  . Back Pain    HISTORY OF PRESENT ILLNESS:  Kerry Brooks is a Kerry Brooks is a 75 yo woman with LBP, left shoulder pain and knee pain.     LBP/sciatica:  Kerry Brooks has pain across the back and in the buttocks, left > right and into the left leg at times.    In the past, LBP/buttock pain improved after piriformis muscle injections.  Over the last month, pan is increasing again.  Current pain is bilateral, mostly in the buttocks    Pain radiates down the right posterior leg.   Standing increases the pain.    Resting, sitting reduces the pain. Gabapentin and hydrocodone have helped the pain in the past.   Kerry Brooks takes 1-2 hydrocodones a day if pain flares up  Left arm/ shoulder pain:   Kerry Brooks reports pain in the left shoulder >  neck pain.     Subacromial bursa have helped in the past when pain was worse but this is just minimal pain now.   Lupus/PMR/Knee pain:   Kerry Brooks gets Actemra for Lupus (Dr. Hubbard RobinsonZialkowska in [place]).   Kerry Brooks is also on prednisone 15 mg .    This has caused some weight gain and inc. BP.    Mood/Insomnia:   Kerry Brooks is sleeping better since starting Doxepin and Kerry Brooks tolerates it well  Kerry Brooks also takes gabapentin at bedtime.        Bladder:   Kerry Brooks has bladder pressure and has had some incontinence.  This is similar to UTI Kerry Brooks had earlier in the year.    REVIEW OF SYSTEMS: Constitutional: No fevers, chills, sweats, or change in appetite Eyes: No visual changes, double vision, eye pain Ear, nose and throat: No hearing loss, ear pain, nasal congestion, sore throat Cardiovascular: No chest pain, palpitations Respiratory: No shortness of breath at rest or with exertion.   No wheezes GastrointestinaI: No nausea, vomiting, diarrhea, abdominal pain, fecal  incontinence Genitourinary: No dysuria, urinary retention or frequency.  No nocturia. Musculoskeletal: No neck pain.  Notes left shoulder pain and back pain Integumentary: No rash, pruritus, skin lesions Neurological: as above Psychiatric: No depression at this time.  No anxiety Endocrine: No palpitations, diaphoresis, change in appetite, change in weigh or increased thirst Hematologic/Lymphatic: No anemia, purpura, petechiae. Allergic/Immunologic: No itchy/runny eyes, nasal congestion, recent allergic reactions, rashes  ALLERGIES: Allergies  Allergen Reactions  . Canagliflozin Other (See Comments)    Other reaction(s): Other (See Comments) unknown unknown  . Propoxyphene Other (See Comments)    unknown    HOME MEDICATIONS:  Current Outpatient Prescriptions:  .  aspirin EC 81 MG tablet, Take 81 mg by mouth daily., Disp: , Rfl:  .  brinzolamide (AZOPT) 1 % ophthalmic suspension, Frequency:   Dosage:0.0     Instructions:  Note:, Disp: , Rfl:  .  budesonide-formoterol (SYMBICORT) 160-4.5 MCG/ACT inhaler, Frequency:   Dosage:0.0     Instructions:  Note:, Disp: , Rfl:  .  carvedilol (COREG) 25 MG tablet, Take by mouth., Disp: , Rfl:  .  cephALEXin (KEFLEX) 500 MG capsule, Take 500 mg by mouth., Disp: , Rfl:  .  Cyanocobalamin (VITAMIN B-12 CR) 1000 MCG TBCR, Frequency:   Dosage:0.0     Instructions:  Note:, Disp: , Rfl:  .  cyclobenzaprine (FLEXERIL) 5 MG  tablet, Take 1 tablet (5 mg total) by mouth 3 (three) times daily as needed for muscle spasms., Disp: 90 tablet, Rfl: 0 .  diclofenac sodium (VOLTAREN) 1 % GEL, Apply 2 g topically 3 (three) times daily. Frequency:   Dosage:0.0     Instructions:  Note:, Disp: 400 g, Rfl: 3 .  doxepin (SINEQUAN) 10 MG capsule, Take 1 capsule (10 mg total) by mouth at bedtime., Disp: 305 capsule, Rfl: 5 .  Ergocalciferol (VITAMIN D2) 2000 UNITS TABS, Frequency:   Dosage:0     Instructions:  Note:take 5000 iu everyday, Disp: , Rfl:  .  fluticasone  (FLONASE) 50 MCG/ACT nasal spray, Frequency:   Dosage:0.0     Instructions:  Note:, Disp: , Rfl:  .  gabapentin (NEURONTIN) 300 MG capsule, One in am, one in pm and 2 at night, Disp: 360 capsule, Rfl: 3 .  glimepiride (AMARYL) 4 MG tablet, Take by mouth., Disp: , Rfl:  .  HYDROcodone-acetaminophen (NORCO) 7.5-325 MG tablet, Take 1 tablet by mouth 3 (three) times daily as needed. Fill on or after 02/01/16, Disp: 90 tablet, Rfl: 0 .  insulin glargine (LANTUS) 100 UNIT/ML injection, Inject into the skin., Disp: , Rfl:  .  losartan-hydrochlorothiazide (HYZAAR) 100-12.5 MG per tablet, Take by mouth., Disp: , Rfl:  .  metFORMIN (GLUCOPHAGE) 500 MG tablet, Take by mouth., Disp: , Rfl:  .  methylPREDNISolone acetate (DEPO-MEDROL) 40 MG/ML injection, Inject 40 mg into the muscle., Disp: , Rfl:  .  Multiple Vitamin (MULTIVITAMIN) capsule, Take by mouth., Disp: , Rfl:  .  pantoprazole (PROTONIX) 40 MG tablet, Take 40 mg by mouth., Disp: , Rfl:  .  potassium chloride (K-DUR) 10 MEQ tablet, Take by mouth., Disp: , Rfl:  .  pravastatin (PRAVACHOL) 40 MG tablet, Take by mouth., Disp: , Rfl:  .  predniSONE (DELTASONE) 5 MG tablet, Take by mouth., Disp: , Rfl:  .  PROCTOSOL HC 2.5 % rectal cream, INSERT 1 APPLICATORFUL RECTALLY AT BEDTIME AS NEEDED., Disp: , Rfl: 0 .  Tocilizumab (ACTEMRA IV), Inject into the vein every 30 (thirty) days., Disp: , Rfl:   PAST MEDICAL HISTORY: Past Medical History:  Diagnosis Date  . Diabetes mellitus without complication (HCC)   . Hypertension   . Neuropathy (HCC)   . Vertebral fracture, osteoporotic (HCC)     PAST SURGICAL HISTORY: No past surgical history on file.  FAMILY HISTORY: No family history on file.  SOCIAL HISTORY:  Social History   Social History  . Marital status: Divorced    Spouse name: N/A  . Number of children: N/A  . Years of education: N/A   Occupational History  . Not on file.   Social History Main Topics  . Smoking status: Never Smoker    . Smokeless tobacco: Not on file  . Alcohol use No  . Drug use: No  . Sexual activity: Not on file   Other Topics Concern  . Not on file   Social History Narrative  . No narrative on file     PHYSICAL EXAM  Vitals:   01/02/16 1045  BP: (!) 174/86  Pulse: 78  Resp: 18  Weight: 196 lb 8 oz (89.1 kg)  Height: 5\' 3"  (1.6 m)    Body mass index is 34.81 kg/m.   General: The patient is well-developed and well-nourished and in no acute distress  Neck: The neck is supple   The neck is nontender.  Musculoskeletal:  Kerry Brooks is tender over both piriformis muscles >  lower lumbar paraspinals.  No trochanteric bursa tenderness  Neurologic Exam  Mental status: The patient is alert and oriented x 3 at the time of the examination. The patient has apparent normal recent and remote memory, with an apparently normal attention span and concentration ability.   Speech is normal.  Cranial nerves: Extraocular movements are full.   Facial symmetry is present.   Trapezius and sternocleidomastoid strength is normal. No dysarthria is noted.     Motor:  Muscle bulk is normal.   Tone is normal. Strength is  5 / 5 in all 4 extremities.   Sensory: Sensory testing is intact to touch  in all 4 extremities.  Gait and station: Station is normal.   Gait is normal for age. Tandem gait is mildly wide.    Reflexes: Deep tendon reflexes are symmetric and 1 in arms and absent in legs bilaterally.       DIAGNOSTIC DATA (LABS, IMAGING, TESTING) - I reviewed patient records, labs, notes, testing and imaging myself where available.     ASSESSMENT AND PLAN  Bilateral sciatica  Systemic lupus erythematosus, unspecified SLE type, unspecified organ involvement status (HCC)  Insomnia, unspecified type  Urinary incontinence, unspecified type - Plan: Urinalysis, Routine w reflex microscopic (not at [facility]), Culture, Urine   1.  Trigger point inject both of the piriformis muscles and the L4L5 paraspinal  muscles with 60 mg depo-medrol in 6 mL Marcaine.  2.   Refill hydrocodone and gabapentin.  3  Stay active, exercise and try to lose weight 4.  Check UA and UCx. 5.  Kerry Brooks will return to see me in  4 months or sooner if Kerry Brooks has new or worsening neurologic symptoms.   Richard A. Epimenio FootSater, MD, PhD 01/02/2016, 12:14 PM Certified in Neurology, Clinical Neurophysiology, Sleep Medicine, Pain Medicine and Neuroimaging  [facility] [address], [address] [place], [place] [address] [phone]

## 2016-01-03 LAB — URINALYSIS, ROUTINE W REFLEX MICROSCOPIC
Bilirubin, UA: NEGATIVE
Ketones, UA: NEGATIVE
Nitrite, UA: NEGATIVE
Protein, UA: NEGATIVE
Specific Gravity, UA: 1.016 (ref 1.005–1.030)
Urobilinogen, Ur: 1 mg/dL (ref 0.2–1.0)
pH, UA: 6.5 (ref 5.0–7.5)

## 2016-01-03 LAB — MICROSCOPIC EXAMINATION
CASTS: NONE SEEN /LPF
WBC, UA: >30 /hpf — AB (ref 0–?)

## 2016-01-04 ENCOUNTER — Telehealth: Payer: Self-pay | Admitting: Neurology

## 2016-01-04 LAB — URINE CULTURE

## 2016-01-04 MED ORDER — SULFAMETHOXAZOLE-TRIMETHOPRIM 800-160 MG PO TABS: 1.0000 | ORAL_TABLET | Freq: Two times a day (BID) | ORAL | 0 refills | Status: DC

## 2016-01-04 NOTE — Telephone Encounter (Signed)
The urine culture showed that she had Escherichia coli. It is sensitive to Bactrim and I will call in 14 pills . I spoke to Kerry Brooks and she knows to pick up the prescription.

## 2016-05-09 ENCOUNTER — Ambulatory Visit (INDEPENDENT_AMBULATORY_CARE_PROVIDER_SITE_OTHER): Payer: Medicare Other | Admitting: Neurology

## 2016-05-09 ENCOUNTER — Encounter: Payer: Self-pay | Admitting: Neurology

## 2016-05-09 VITALS — BP 160/76 | HR 89 | Resp 18 | Ht 63.0 in | Wt 200.5 lb

## 2016-05-09 DIAGNOSIS — M5432 Sciatica, left side: Secondary | ICD-10-CM | POA: Diagnosis not present

## 2016-05-09 DIAGNOSIS — M5440 Lumbago with sciatica, unspecified side: Secondary | ICD-10-CM

## 2016-05-09 DIAGNOSIS — M353 Polymyalgia rheumatica: Secondary | ICD-10-CM | POA: Diagnosis not present

## 2016-05-09 DIAGNOSIS — G4700 Insomnia, unspecified: Secondary | ICD-10-CM | POA: Diagnosis not present

## 2016-05-09 DIAGNOSIS — M542 Cervicalgia: Secondary | ICD-10-CM

## 2016-05-09 MED ORDER — HYDROCODONE-ACETAMINOPHEN 7.5-325 MG PO TABS: 1.0000 | ORAL_TABLET | Freq: Three times a day (TID) | ORAL | 0 refills | Status: DC | PRN

## 2016-05-09 NOTE — Progress Notes (Signed)
[facility]  PATIENT: Kerry Brooks DOB: [birth year]  REFERRING DOCTOR OR PCP:  Kerry Brooks  SOURCE: patient  _________________________________   HISTORICAL  CHIEF COMPLAINT:  Chief Complaint  Patient presents with  . Back Pain    Sts. lbp radiating into both legs (left worse than right) has been worse over the last couple of months./fim    HISTORY OF PRESENT ILLNESS:  She is a Kerry Brooks is a 76 yo woman with LBP, left shoulder pain and knee pain.     LBP/sciatica:  She was sick for several months and had increased sciatica, especially on her left.   She reports pain across the lower back and into the L > R buttocks.   In the past, LBP/buttock pain improved after piriformis muscle injections.  Over the last month, pan is increasing again.  Current pain is bilateral, mostly in the buttocks    Pain radiates down the right posterior leg.   Standing increases the pain.    Resting, sitting reduces the pain.    She has noted some dysesthesias in her left leg (from buttock to ankle).   Gabapentin and hydrocodone have helped the pain in the past.   She takes 1-2 hydrocodones a day if pain flares up  Neck/shoulder pain:   She reports mild pain in the neck and left shoulder     Subacromial bursa have helped in the past when pain was worse but this is just minimal pain now.   Lupus/PMR/Knee pain:   She see Kerry Brooks in [place] and is on Actemra for Lupus and prednisone for PMR  DM:  She has DM but denies numbness in her toes.    Mood/Insomnia:   Insomnia has done better with gabapentin and doxepin at bedtime.Marland Kitchen.         REVIEW OF SYSTEMS: Constitutional: No fevers, chills, sweats, or change in appetite Eyes: No visual changes, double vision, eye pain Ear, nose and throat: No hearing loss, ear pain, nasal congestion, sore throat Cardiovascular: No chest pain, palpitations Respiratory: No shortness of breath at rest or with exertion.   No  wheezes GastrointestinaI: No nausea, vomiting, diarrhea, abdominal pain, fecal incontinence Genitourinary: No dysuria, urinary retention or frequency.  No nocturia. Musculoskeletal: No neck pain.  Notes left shoulder pain and back pain Integumentary: No rash, pruritus, skin lesions Neurological: as above Psychiatric: No depression at this time.  No anxiety Endocrine: No palpitations, diaphoresis, change in appetite, change in weigh or increased thirst Hematologic/Lymphatic: No anemia, purpura, petechiae. Allergic/Immunologic: No itchy/runny eyes, nasal congestion, recent allergic reactions, rashes  ALLERGIES: Allergies  Allergen Reactions  . Canagliflozin Other (See Comments)    Other reaction(s): Other (See Comments) unknown unknown  . Propoxyphene Other (See Comments)    unknown    HOME MEDICATIONS:  Current Outpatient Prescriptions:  .  aspirin EC 81 MG tablet, Take 81 mg by mouth daily., Disp: , Rfl:  .  brinzolamide (AZOPT) 1 % ophthalmic suspension, Frequency:   Dosage:0.0     Instructions:  Note:, Disp: , Rfl:  .  budesonide-formoterol (SYMBICORT) 160-4.5 MCG/ACT inhaler, Frequency:   Dosage:0.0     Instructions:  Note:, Disp: , Rfl:  .  carvedilol (COREG) 25 MG tablet, Take by mouth., Disp: , Rfl:  .  doxepin (SINEQUAN) 10 MG capsule, Take 1 capsule (10 mg total) by mouth at bedtime., Disp: 305 capsule, Rfl: 5 .  fluticasone (FLONASE) 50 MCG/ACT nasal spray, Frequency:   Dosage:0.0  Instructions:  Note:, Disp: , Rfl:  .  gabapentin (NEURONTIN) 300 MG capsule, One in am, one in pm and 2 at night, Disp: 360 capsule, Rfl: 3 .  glucose blood (ONE TOUCH ULTRA TEST) test strip, Use 1 test strip to check blood sugar twice daily, Disp: , Rfl:  .  HYDROcodone-acetaminophen (NORCO) 7.5-325 MG tablet, Take 1 tablet by mouth 3 (three) times daily as needed., Disp: 90 tablet, Rfl: 0 .  insulin glargine (LANTUS) 100 UNIT/ML injection, Inject into the skin., Disp: , Rfl:  .   losartan-hydrochlorothiazide (HYZAAR) 100-12.5 MG per tablet, Take by mouth., Disp: , Rfl:  .  metFORMIN (GLUCOPHAGE) 500 MG tablet, Take by mouth., Disp: , Rfl:  .  methylPREDNISolone acetate (DEPO-MEDROL) 40 MG/ML injection, Inject 40 mg into the muscle., Disp: , Rfl:  .  Multiple Vitamin (MULTIVITAMIN) capsule, Take by mouth., Disp: , Rfl:  .  pantoprazole (PROTONIX) 40 MG tablet, Take 40 mg by mouth., Disp: , Rfl:  .  pravastatin (PRAVACHOL) 40 MG tablet, Take by mouth., Disp: , Rfl:  .  predniSONE (DELTASONE) 5 MG tablet, Take by mouth., Disp: , Rfl:  .  Tocilizumab (ACTEMRA IV), Inject into the vein every 30 (thirty) days., Disp: , Rfl:  .  Cyanocobalamin (VITAMIN B-12 CR) 1000 MCG TBCR, Frequency:   Dosage:0.0     Instructions:  Note:, Disp: , Rfl:  .  cyclobenzaprine (FLEXERIL) 5 MG tablet, Take 1 tablet (5 mg total) by mouth 3 (three) times daily as needed for muscle spasms. (Patient not taking: Reported on 05/09/2016), Disp: 90 tablet, Rfl: 0 .  diclofenac sodium (VOLTAREN) 1 % GEL, Apply 2 g topically 3 (three) times daily. Frequency:   Dosage:0.0     Instructions:  Note: (Patient not taking: Reported on 05/09/2016), Disp: 400 g, Rfl: 3 .  Ergocalciferol (VITAMIN D2) 2000 UNITS TABS, Frequency:   Dosage:0     Instructions:  Note:take 5000 iu everyday, Disp: , Rfl:  .  potassium chloride (K-DUR) 10 MEQ tablet, Take by mouth., Disp: , Rfl:  .  PROCTOSOL HC 2.5 % rectal cream, INSERT 1 APPLICATORFUL RECTALLY AT BEDTIME AS NEEDED., Disp: , Rfl: 0  PAST MEDICAL HISTORY: Past Medical History:  Diagnosis Date  . Diabetes mellitus without complication (HCC)   . Hypertension   . Neuropathy (HCC)   . Vertebral fracture, osteoporotic (HCC)     PAST SURGICAL HISTORY: No past surgical history on file.  FAMILY HISTORY: No family history on file.  SOCIAL HISTORY:  Social History   Social History  . Marital status: Divorced    Spouse name: N/A  . Number of children: N/A  . Years of  education: N/A   Occupational History  . Not on file.   Social History Main Topics  . Smoking status: Never Smoker  . Smokeless tobacco: Never Used  . Alcohol use No  . Drug use: No  . Sexual activity: Not on file   Other Topics Concern  . Not on file   Social History Narrative  . No narrative on file     PHYSICAL EXAM  Vitals:   05/09/16 1126  BP: (!) 160/76  Pulse: 89  Resp: 18  Weight: 200 lb 8 oz (90.9 kg)  Height: 5\' 3"  (1.6 m)    Body mass index is 35.52 kg/m.   General: The patient is well-developed and well-nourished and in no acute distress  Neck: The neck is supple   The neck is nontender.  Musculoskeletal:  She  is tender over left piriformis muscles > lower lumbar paraspinals.  No trochanteric bursa tenderness  Neurologic Exam  Mental status: The patient is alert and oriented x 3 at the time of the examination. The patient has apparent normal recent and remote memory, with an apparently normal attention span and concentration ability.   Speech is normal.  Cranial nerves: Extraocular movements are full.   Facial symmetry is present.   Trapezius and sternocleidomastoid strength is normal. No dysarthria is noted.     Motor:  Muscle bulk is normal.   Tone is normal. Strength is  5 / 5 in all 4 extremities.   Sensory: Sensory testing is intact to touch  in all 4 extremities (including toes)  Gait and station: Station is normal.   Gait is mildly arthritic. Tandem gait is wide.    Reflexes: Deep tendon reflexes are symmetric and 1 in arms and absent in legs bilaterally.       DIAGNOSTIC DATA (LABS, IMAGING, TESTING) - I reviewed patient records, labs, notes, testing and imaging myself where available.     ASSESSMENT AND PLAN  Sciatica of left side  Bilateral low back pain with sciatica, sciatica laterality unspecified, unspecified chronicity  Polymyalgia rheumatica (HCC)  Insomnia, unspecified type  Neck pain   1.  Trigger point inject  left  the piriformis muscles and the L4L5 paraspinal muscles with 60 mg depo-medrol in 6 mL Marcaine.  2.   Refill hydrocodone and continue gabapentin.  3   Stay active, exercise and try to lose weight 4.  She will return to see me in  4 months or sooner if she has new or worsening neurologic symptoms.   Richard A. Epimenio Foot, MD, PhD 05/09/2016, 1:00 PM Certified in Neurology, Clinical Neurophysiology, Sleep Medicine, Pain Medicine and Neuroimaging  [facility] [address] [place], [place] [address] [phone] p

## 2016-09-10 ENCOUNTER — Ambulatory Visit (INDEPENDENT_AMBULATORY_CARE_PROVIDER_SITE_OTHER): Payer: Medicare Other | Admitting: Neurology

## 2016-09-10 ENCOUNTER — Encounter: Payer: Self-pay | Admitting: Neurology

## 2016-09-10 DIAGNOSIS — M25512 Pain in left shoulder: Secondary | ICD-10-CM

## 2016-09-10 DIAGNOSIS — M5442 Lumbago with sciatica, left side: Secondary | ICD-10-CM | POA: Diagnosis not present

## 2016-09-10 DIAGNOSIS — G4700 Insomnia, unspecified: Secondary | ICD-10-CM | POA: Diagnosis not present

## 2016-09-10 DIAGNOSIS — M542 Cervicalgia: Secondary | ICD-10-CM

## 2016-09-10 DIAGNOSIS — M5441 Lumbago with sciatica, right side: Secondary | ICD-10-CM

## 2016-09-10 DIAGNOSIS — G8929 Other chronic pain: Secondary | ICD-10-CM | POA: Diagnosis not present

## 2016-09-10 MED ORDER — HYDROCODONE-ACETAMINOPHEN 7.5-325 MG PO TABS: 1.0000 | ORAL_TABLET | Freq: Three times a day (TID) | ORAL | 0 refills | Status: DC | PRN

## 2016-09-10 NOTE — Progress Notes (Signed)
[facility]  PATIENT: Kerry Brooks DOB: 1940/12/13  REFERRING DOCTOR OR PCP:  Brooke BonitoWarren Gallemore  SOURCE: patient  _________________________________   HISTORICAL  CHIEF COMPLAINT:  Chief Complaint  Patient presents with  . Back Pain  . Left Shoulder Pain    HISTORY OF PRESENT ILLNESS:  Kerry KaiserWillie Brooks is a 76 yo woman with LBP, left shoulder pain and knee pain.      Left sided sciatica:   She reports pain in her lower back and buttock and down the left leg   In the past, LBP/buttock pain improved after piriformis muscle injections.  Pain has increased over the last month on the left.  The right back and leg are fine. When intense, the pain will radiate down the left posterior leg.  She has noted some dysesthesias in her left leg (from buttock to ankle).   Gabapentin and hydrocodone have helped the pain in the past.   She takes 1-2 hydrocodones a day if pain flares up  Neck/shoulder pain:   She feels her neck and shoulder pain are not too bad at the current time. In the past, she received benefit from subacromial bursa injections.   Lupus/PMR/Knee pain:   [facility] and is on Actemra for Lupus and prednisone for PMR  DM:  She has DM but denies numbness in her toes.    Mood/Insomnia:   Insomnia has done better with gabapentin and doxepin at bedtime.Marland Kitchen.         REVIEW OF SYSTEMS: Constitutional: No fevers, chills, sweats, or change in appetite Eyes: No visual changes, double vision, eye pain Ear, nose and throat: No hearing loss, ear pain, nasal congestion, sore throat Cardiovascular: No chest pain, palpitations Respiratory: No shortness of breath at rest or with exertion.   No wheezes GastrointestinaI: No nausea, vomiting, diarrhea, abdominal pain, fecal incontinence Genitourinary: No dysuria, urinary retention or frequency.  No nocturia. Musculoskeletal: No neck pain.  Notes left shoulder pain and back pain Integumentary: No rash,  pruritus, skin lesions Neurological: as above Psychiatric: No depression at this time.  No anxiety Endocrine: No palpitations, diaphoresis, change in appetite, change in weigh or increased thirst Hematologic/Lymphatic: No anemia, purpura, petechiae. Allergic/Immunologic: No itchy/runny eyes, nasal congestion, recent allergic reactions, rashes  ALLERGIES: Allergies  Allergen Reactions  . Canagliflozin Other (See Comments)    Other reaction(s): Other (See Comments) unknown unknown  . Propoxyphene Other (See Comments)    unknown    HOME MEDICATIONS:  Current Outpatient Prescriptions:  .  amLODipine (NORVASC) 5 MG tablet, Take 5 mg by mouth., Disp: , Rfl:  .  aspirin EC 81 MG tablet, Take 81 mg by mouth daily., Disp: , Rfl:  .  brinzolamide (AZOPT) 1 % ophthalmic suspension, Frequency:   Dosage:0.0     Instructions:  Note:, Disp: , Rfl:  .  budesonide-formoterol (SYMBICORT) 160-4.5 MCG/ACT inhaler, Frequency:   Dosage:0.0     Instructions:  Note:, Disp: , Rfl:  .  carvedilol (COREG) 25 MG tablet, Take by mouth., Disp: , Rfl:  .  Cyanocobalamin (VITAMIN B-12 CR) 1000 MCG TBCR, Frequency:   Dosage:0.0     Instructions:  Note:, Disp: , Rfl:  .  cyclobenzaprine (FLEXERIL) 5 MG tablet, Take 1 tablet (5 mg total) by mouth 3 (three) times daily as needed for muscle spasms., Disp: 90 tablet, Rfl: 0 .  diclofenac sodium (VOLTAREN) 1 % GEL, Apply 2 g topically 3 (three) times daily. Frequency:   Dosage:0.0  Instructions:  Note:, Disp: 400 g, Rfl: 3 .  doxepin (SINEQUAN) 10 MG capsule, Take 1 capsule (10 mg total) by mouth at bedtime., Disp: 305 capsule, Rfl: 5 .  Ergocalciferol (VITAMIN D2) 2000 UNITS TABS, Frequency:   Dosage:0     Instructions:  Note:take 5000 iu everyday, Disp: , Rfl:  .  fluticasone (FLONASE) 50 MCG/ACT nasal spray, Frequency:   Dosage:0.0     Instructions:  Note:, Disp: , Rfl:  .  gabapentin (NEURONTIN) 300 MG capsule, One in am, one in pm and 2 at night, Disp: 360  capsule, Rfl: 3 .  glucose blood (ONE TOUCH ULTRA TEST) test strip, Use 1 test strip to check blood sugar twice daily, Disp: , Rfl:  .  HYDROcodone-acetaminophen (NORCO) 7.5-325 MG tablet, Take 1 tablet by mouth 3 (three) times daily as needed., Disp: 90 tablet, Rfl: 0 .  insulin glargine (LANTUS) 100 UNIT/ML injection, Inject into the skin., Disp: , Rfl:  .  losartan-hydrochlorothiazide (HYZAAR) 100-12.5 MG per tablet, Take by mouth., Disp: , Rfl:  .  metFORMIN (GLUCOPHAGE) 500 MG tablet, Take by mouth., Disp: , Rfl:  .  methylPREDNISolone acetate (DEPO-MEDROL) 40 MG/ML injection, Inject 40 mg into the muscle., Disp: , Rfl:  .  Multiple Vitamin (MULTIVITAMIN) capsule, Take by mouth., Disp: , Rfl:  .  pantoprazole (PROTONIX) 40 MG tablet, Take 40 mg by mouth., Disp: , Rfl:  .  potassium chloride (K-DUR) 10 MEQ tablet, Take by mouth., Disp: , Rfl:  .  pravastatin (PRAVACHOL) 40 MG tablet, Take by mouth., Disp: , Rfl:  .  predniSONE (DELTASONE) 5 MG tablet, Take by mouth., Disp: , Rfl:  .  PROCTOSOL HC 2.5 % rectal cream, INSERT 1 APPLICATORFUL RECTALLY AT BEDTIME AS NEEDED., Disp: , Rfl: 0 .  Tocilizumab (ACTEMRA IV), Inject into the vein every 30 (thirty) days., Disp: , Rfl:   PAST MEDICAL HISTORY: Past Medical History:  Diagnosis Date  . Diabetes mellitus without complication (HCC)   . Hypertension   . Neuropathy   . Vertebral fracture, osteoporotic (HCC)     PAST SURGICAL HISTORY: No past surgical history on file.  FAMILY HISTORY: No family history on file.  SOCIAL HISTORY:  Social History   Social History  . Marital status: Divorced    Spouse name: N/A  . Number of children: N/A  . Years of education: N/A   Occupational History  . Not on file.   Social History Main Topics  . Smoking status: Never Smoker  . Smokeless tobacco: Never Used  . Alcohol use No  . Drug use: No  . Sexual activity: Not on file   Other Topics Concern  . Not on file   Social History  Narrative  . No narrative on file     PHYSICAL EXAM  There were no vitals filed for this visit.  There is no height or weight on file to calculate BMI.   General: The patient is well-developed and well-nourished and in no acute distress  Neck: The neck is supple   The neck is nontender.  Musculoskeletal:  There is tenderness over the piriformis muscle on the left. There is milder tenderness on the right and at the paraspinal muscle tenderness. The trochanteric bursae are not tender.     The neck and shoulder are nontender.  Neurologic Exam  Mental status: The patient is alert and oriented x 3 at the time of the examination. The patient has apparent normal recent and remote memory, with an apparently  normal attention span and concentration ability.   Speech is normal.  Cranial nerves: Extraocular movements are full.   Facial symmetry is present.   Trapezius and sternocleidomastoid strength is normal. No dysarthria is noted.     Motor:  Muscle bulk is normal.   Tone is normal. Strength is  5 / 5 in all 4 extremities.   Sensory: Sensory testing is intact to touch  in all 4 extremities (including toes)  Gait and station: Station is normal.   Gait is mildly arthritic. Tandem gait is wide but probably normal for age.  Reflexes: Deep tendon reflexes are symmetric and 1 in arms and absent in legs bilaterally.       DIAGNOSTIC DATA (LABS, IMAGING, TESTING) - I reviewed patient records, labs, notes, testing and imaging myself where available.     ASSESSMENT AND PLAN  Chronic bilateral low back pain with bilateral sciatica  Pain in joint of left shoulder  Insomnia, unspecified type  Neck pain   1.    Left piriformis muscle was injected with 60 mg Depo-Medrol in 5 mL Marcaine using sterile technique.. She tolerated the procedure well and there were no complications.   2.   I refilled the hydrocodone and gabapentin and maintain the current dose.  3   Stay active, exercise and  try to lose weight 4.  She will return to see me in 4 months,  sooner if she has new or worsening neurologic symptoms.   Guled Gahan A. Epimenio Foot, MD, PhD 09/10/2016, 5:14 PM Certified in Neurology, Clinical Neurophysiology, Sleep Medicine, Pain Medicine and Neuroimaging  [facility] [address] [place], [place] [address] [phone] p

## 2016-12-30 ENCOUNTER — Telehealth: Payer: Self-pay | Admitting: Neurology

## 2016-12-30 NOTE — Telephone Encounter (Signed)
Spoke with Kerry Brooks this morning.  She is being treated for a UTI.  No fever.  Getting better. Blood sugars are ok--last A1C down to just over 7. Advised ok to come in for tpi's on Thursday as long as she continues to improve./fim

## 2016-12-30 NOTE — Telephone Encounter (Signed)
Patient has an appointment with Dr. Epimenio FootSater this Thursday 01-02-17 and will probobay get an injection. She has a UTI and is on an antibiotic through this Wednesday. Should she keep this appointment.? Please call and advise.

## 2017-01-02 ENCOUNTER — Ambulatory Visit (INDEPENDENT_AMBULATORY_CARE_PROVIDER_SITE_OTHER): Payer: Medicare Other | Admitting: Neurology

## 2017-01-02 ENCOUNTER — Encounter: Payer: Self-pay | Admitting: Neurology

## 2017-01-02 ENCOUNTER — Encounter (INDEPENDENT_AMBULATORY_CARE_PROVIDER_SITE_OTHER): Payer: Self-pay

## 2017-01-02 VITALS — BP 114/70 | HR 78 | Resp 18 | Ht 63.5 in | Wt 206.0 lb

## 2017-01-02 DIAGNOSIS — M5431 Sciatica, right side: Secondary | ICD-10-CM | POA: Diagnosis not present

## 2017-01-02 DIAGNOSIS — M25512 Pain in left shoulder: Secondary | ICD-10-CM

## 2017-01-02 DIAGNOSIS — G4700 Insomnia, unspecified: Secondary | ICD-10-CM

## 2017-01-02 DIAGNOSIS — M5440 Lumbago with sciatica, unspecified side: Secondary | ICD-10-CM

## 2017-01-02 DIAGNOSIS — M5432 Sciatica, left side: Secondary | ICD-10-CM

## 2017-01-02 MED ORDER — DICLOFENAC SODIUM 1 % TD GEL: 2.0000 g | Freq: Three times a day (TID) | TRANSDERMAL | 3 refills | Status: DC

## 2017-01-02 MED ORDER — HYDROCODONE-ACETAMINOPHEN 7.5-325 MG PO TABS: 1.0000 | ORAL_TABLET | Freq: Three times a day (TID) | ORAL | 0 refills | Status: DC | PRN

## 2017-01-02 NOTE — Progress Notes (Signed)
[facility]  PATIENT: Kerry Brooks DOB: [birth year]  REFERRING DOCTOR OR PCP:  Brooke BonitoWarren Gallemore  SOURCE: patient  _________________________________   HISTORICAL  CHIEF COMPLAINT:  Chief Complaint  Patient presents with  . Back Pain    Sts. lbp and left shoulder pain is worse--requests tpi's today if appropriate/fim  . Left Shoulder Pain    HISTORY OF PRESENT ILLNESS:  Kerry Brooks is a 76 yo woman with LBP, left shoulder pain and knee pain.     Update 01/02/2017:    Currently,  the worse pain is across her back.  Pain increases with chores/exertion.    The pain is generally reduced when she is sitting. This is similar to pain that she has had in the past that improved with trigger point injections. When the pain is more severe she is taking the hydrocodone, usually 2 times a day.  She also reprorts pain in her left shoulder.  Pain increases with lifting and external rotation.   The pain is improved if the shoulder is relaxed and she holds her arm into her chest. This is similar to pain that she has experienced in the past that improved with a subacromial bursa injection.  Volltaren gel helped but was expensive  Insomnia has done better with a combination of gabapentin and doxepin. She tolerates these medications well.  -  From 09/10/2016: Left sided sciatica:   She reports pain in her lower back and buttock and down the left leg   In the past, LBP/buttock pain improved after piriformis muscle injections.  Pain has increased over the last month on the left.  The right back and leg are fine. When intense, the pain will radiate down the left posterior leg.  She has noted some dysesthesias in her left leg (from buttock to ankle).   Gabapentin and hydrocodone have helped the pain in the past.   She takes 1-2 hydrocodones a day if pain flares up  Neck/shoulder pain:   She feels her neck and shoulder pain are not too bad at the current time. In the past, she  received benefit from subacromial bursa injections.   Lupus/PMR/Knee pain:   [facility] and [facility] on Actemra for Lupus and prednisone for PMR  DM:  She has DM but denies numbness in her toes.    Mood/Insomnia:   Insomnia has done better with gabapentin and doxepin at bedtime.Marland Kitchen.         REVIEW OF SYSTEMS: Constitutional: No fevers, chills, sweats, or change in appetite Eyes: No visual changes, double vision, eye pain Ear, nose and throat: No hearing loss, ear pain, nasal congestion, sore throat Cardiovascular: No chest pain, palpitations Respiratory: No shortness of breath at rest or with exertion.   No wheezes GastrointestinaI: No nausea, vomiting, diarrhea, abdominal pain, fecal incontinence Genitourinary: No dysuria, urinary retention or frequency.  No nocturia. Musculoskeletal: Mild neck pain.  Notes left shoulder pain and bilat back pain Integumentary: No rash, pruritus, skin lesions Neurological: as above Psychiatric: No depression at this time.  No anxiety Endocrine: No palpitations, diaphoresis, change in appetite, change in weigh or increased thirst Hematologic/Lymphatic: No anemia, purpura, petechiae. Allergic/Immunologic: No itchy/runny eyes, nasal congestion, recent allergic reactions, rashes  ALLERGIES: Allergies  Allergen Reactions  . Canagliflozin Other (See Comments)    Other reaction(s): Other (See Comments) unknown unknown  . Propoxyphene Other (See Comments)    unknown    HOME MEDICATIONS:  Current Outpatient Medications:  .  amLODipine (  NORVASC) 5 MG tablet, Take 5 mg by mouth., Disp: , Rfl:  .  aspirin EC 81 MG tablet, Take 81 mg by mouth daily., Disp: , Rfl:  .  brinzolamide (AZOPT) 1 % ophthalmic suspension, Frequency:   Dosage:0.0     Instructions:  Note:, Disp: , Rfl:  .  carvedilol (COREG) 25 MG tablet, Take by mouth., Disp: , Rfl:  .  Cyanocobalamin (VITAMIN B-12 CR) 1000 MCG TBCR, Frequency:   Dosage:0.0     Instructions:  Note:,  Disp: , Rfl:  .  Ergocalciferol (VITAMIN D2) 2000 UNITS TABS, Frequency:   Dosage:0     Instructions:  Note:take 5000 iu everyday, Disp: , Rfl:  .  fluticasone (FLONASE) 50 MCG/ACT nasal spray, Frequency:   Dosage:0.0     Instructions:  Note:, Disp: , Rfl:  .  gabapentin (NEURONTIN) 300 MG capsule, One in am, one in pm and 2 at night, Disp: 360 capsule, Rfl: 3 .  glucose blood (ONE TOUCH ULTRA TEST) test strip, Use 1 test strip to check blood sugar twice daily, Disp: , Rfl:  .  HYDROcodone-acetaminophen (NORCO) 7.5-325 MG tablet, Take 1 tablet 3 (three) times daily as needed by mouth. Fill on or after 02/01/2017, Disp: 90 tablet, Rfl: 0 .  insulin glargine (LANTUS) 100 UNIT/ML injection, Inject into the skin., Disp: , Rfl:  .  losartan-hydrochlorothiazide (HYZAAR) 100-12.5 MG per tablet, Take by mouth., Disp: , Rfl:  .  metFORMIN (GLUCOPHAGE) 500 MG tablet, Take by mouth., Disp: , Rfl:  .  Multiple Vitamin (MULTIVITAMIN) capsule, Take by mouth., Disp: , Rfl:  .  pantoprazole (PROTONIX) 40 MG tablet, Take 40 mg by mouth., Disp: , Rfl:  .  potassium chloride (K-DUR) 10 MEQ tablet, Take by mouth., Disp: , Rfl:  .  pravastatin (PRAVACHOL) 40 MG tablet, Take by mouth., Disp: , Rfl:  .  predniSONE (DELTASONE) 5 MG tablet, Take by mouth., Disp: , Rfl:  .  PROCTOSOL HC 2.5 % rectal cream, INSERT 1 APPLICATORFUL RECTALLY AT BEDTIME AS NEEDED., Disp: , Rfl: 0 .  Tocilizumab (ACTEMRA IV), Inject into the vein every 30 (thirty) days., Disp: , Rfl:  .  budesonide-formoterol (SYMBICORT) 160-4.5 MCG/ACT inhaler, Frequency:   Dosage:0.0     Instructions:  Note:, Disp: , Rfl:  .  cyclobenzaprine (FLEXERIL) 5 MG tablet, Take 1 tablet (5 mg total) by mouth 3 (three) times daily as needed for muscle spasms. (Patient not taking: Reported on 01/02/2017), Disp: 90 tablet, Rfl: 0 .  diclofenac sodium (VOLTAREN) 1 % GEL, Apply 2 g 3 (three) times daily topically. Frequency:   Dosage:0.0     Instructions:  Note:, Disp: 200 g,  Rfl: 3 .  doxepin (SINEQUAN) 10 MG capsule, Take 1 capsule (10 mg total) by mouth at bedtime. (Patient not taking: Reported on 01/02/2017), Disp: 305 capsule, Rfl: 5 .  methylPREDNISolone acetate (DEPO-MEDROL) 40 MG/ML injection, Inject 40 mg into the muscle., Disp: , Rfl:   PAST MEDICAL HISTORY: Past Medical History:  Diagnosis Date  . Diabetes mellitus without complication (HCC)   . Hypertension   . Neuropathy   . Vertebral fracture, osteoporotic (HCC)     PAST SURGICAL HISTORY: History reviewed. No pertinent surgical history.  FAMILY HISTORY: History reviewed. No pertinent family history.  SOCIAL HISTORY:  Social History   Socioeconomic History  . Marital status: Divorced    Spouse name: Not on file  . Number of children: Not on file  . Years of education: Not on file  .  Highest education level: Not on file  Social Needs  . Financial resource strain: Not on file  . Food insecurity - worry: Not on file  . Food insecurity - inability: Not on file  . Transportation needs - medical: Not on file  . Transportation needs - non-medical: Not on file  Occupational History  . Not on file  Tobacco Use  . Smoking status: Never Smoker  . Smokeless tobacco: Never Used  Substance and Sexual Activity  . Alcohol use: No    Alcohol/week: 0.0 oz  . Drug use: No  . Sexual activity: Not on file  Other Topics Concern  . Not on file  Social History Narrative  . Not on file     PHYSICAL EXAM  Vitals:   01/02/17 1139  BP: 114/70  Pulse: 78  Resp: 18  Weight: 206 lb (93.4 kg)  Height: 5' 3.5" (1.613 m)    Body mass index is 35.92 kg/m.   General: The patient is well-developed and well-nourished and in no acute distress  Neck: The neck is supple   The neck is nontender.  Musculoskeletal:  There is tenderness over the subacromial bursa of the left shoulder. External rotation increases the tenderness. She has tenderness in the neck paraspinal muscles. There is tenderness in  the lower lumbar paraspinal muscles greater than the piriformis muscles.  Neurologic Exam  Mental status: The patient is alert and oriented x 3 at the time of the examination. The patient has apparent normal recent and remote memory, with an apparently normal attention span and concentration ability.   Speech is normal.  Cranial nerves: Extraocular movements are full.   Facial strength and sensation is normal..   Trapezius and sternocleidomastoid strength is normal. No dysarthria is noted.     Motor:  Muscle bulk is normal.   Tone is normal. Strength is  5 / 5 in all 4 extremities.   Sensory: Sensory testing is intact to touch and vib in all 4 extremities   Gait and station: Station is normal.   Gait is mildly arthritic. Tandem gait is mildly wide..  Reflexes: Deep tendon reflexes are symmetric and 1 in arms and absent in legs bilaterally.       DIAGNOSTIC DATA (LABS, IMAGING, TESTING) - I reviewed patient records, labs, notes, testing and imaging myself where available.     ASSESSMENT AND PLAN  Bilateral sciatica  Bilateral low back pain with sciatica, sciatica laterality unspecified, unspecified chronicity  Pain in joint of left shoulder  Insomnia, unspecified type   1.    Bilateral L3-L4 and L4-L5 lumbar paraspinal muscle trigger point injections with 40 mg Depo-Medrol in 5 mL Marcaine using sterile technique.. She tolerated the procedure well and there were no complications.   2.    Left subacromial bursa injection with 40 mg Depo-Medrol in 2.5 mL Marcaine using sterile technique. She tolerated the procedure well and there were no complications. Pain was improved afterwards.  3.  I refilled the hydrocodone and gabapentin and maintain the current dose.  4   Stay active, exercise and try to lose weight 5.  She will return to see me in 4 months,  sooner if she has new or worsening neurologic symptoms.   Richard A. Epimenio FootSater, MD, PhD 01/02/2017, 12:56 PM Certified in Neurology,  Clinical Neurophysiology, Sleep Medicine, Pain Medicine and Neuroimaging  [facility] [address], [address] [place], [place] [address] [phone] p

## 2017-01-20 ENCOUNTER — Other Ambulatory Visit: Payer: Self-pay | Admitting: Neurology

## 2017-05-14 ENCOUNTER — Encounter: Payer: Self-pay | Admitting: Neurology

## 2017-05-14 ENCOUNTER — Other Ambulatory Visit: Payer: Self-pay

## 2017-05-14 ENCOUNTER — Ambulatory Visit (INDEPENDENT_AMBULATORY_CARE_PROVIDER_SITE_OTHER): Payer: Medicare Other | Admitting: Neurology

## 2017-05-14 ENCOUNTER — Encounter (INDEPENDENT_AMBULATORY_CARE_PROVIDER_SITE_OTHER): Payer: Self-pay

## 2017-05-14 VITALS — BP 142/75 | HR 92 | Resp 20 | Ht 63.5 in | Wt 206.0 lb

## 2017-05-14 DIAGNOSIS — M5440 Lumbago with sciatica, unspecified side: Secondary | ICD-10-CM | POA: Diagnosis not present

## 2017-05-14 DIAGNOSIS — M5441 Lumbago with sciatica, right side: Secondary | ICD-10-CM

## 2017-05-14 DIAGNOSIS — G8929 Other chronic pain: Secondary | ICD-10-CM | POA: Diagnosis not present

## 2017-05-14 DIAGNOSIS — M25512 Pain in left shoulder: Secondary | ICD-10-CM | POA: Diagnosis not present

## 2017-05-14 DIAGNOSIS — M5442 Lumbago with sciatica, left side: Secondary | ICD-10-CM | POA: Diagnosis not present

## 2017-05-14 MED ORDER — CYCLOBENZAPRINE HCL 5 MG PO TABS: ORAL_TABLET | ORAL | 5 refills | Status: DC

## 2017-05-14 MED ORDER — HYDROCODONE-ACETAMINOPHEN 7.5-325 MG PO TABS: 1.0000 | ORAL_TABLET | Freq: Three times a day (TID) | ORAL | 0 refills | Status: DC | PRN

## 2017-05-14 NOTE — Progress Notes (Signed)
[facility]  PATIENT: Kerry Brooks DOB: 03/17/1940  REFERRING DOCTOR OR PCP:  Brooke BonitoWarren Gallemore  SOURCE: patient  _________________________________   HISTORICAL  CHIEF COMPLAINT:  Chief Complaint  Patient presents with  . Back Pain    More shoulder and back pain; requesting tpi's today if appropriate/fim    HISTORY OF PRESENT ILLNESS:  Kerry Brooks is a 77 yo woman with LBP, left shoulder pain and knee pain.    Update 05/14/2017: She is reporting more pain in the left shoulder and the right > left buttock.   Shoulder pain is worse with external rotation and elevation.     She does not note arm weakness (but limited by pain).   No radiation of pain into the arm.    The buttock pain is going a little lower but not much into the leg.  She has had generalized muscle aches that worsened when she went on Crestor and improved when she stropped.     Insomnia is better with gabapentin and doxepin  Update 01/02/2017:    Currently,  the worse pain is across her back.  Pain increases with chores/exertion.    The pain is generally reduced when she is sitting. This is similar to pain that she has had in the past that improved with trigger point injections. When the pain is more severe she is taking the hydrocodone, usually 2 times a day.  She also reprorts pain in her left shoulder.  Pain increases with lifting and external rotation.   The pain is improved if the shoulder is relaxed and she holds her arm into her chest. This is similar to pain that she has experienced in the past that improved with a subacromial bursa injection.  Volltaren gel helped but was expensive  Insomnia has done better with a combination of gabapentin and doxepin. She tolerates these medications well.  -  From 09/10/2016: Left sided sciatica:   She reports pain in her lower back and buttock and down the left leg   In the past, LBP/buttock pain improved after piriformis muscle injections.  Pain has  increased over the last month on the left.  The right back and leg are fine. When intense, the pain will radiate down the left posterior leg.  She has noted some dysesthesias in her left leg (from buttock to ankle).   Gabapentin and hydrocodone have helped the pain in the past.   She takes 1-2 hydrocodones a day if pain flares up  Neck/shoulder pain:   She feels her neck and shoulder pain are not too bad at the current time. In the past, she received benefit from subacromial bursa injections.   Lupus/PMR/Knee pain:   [facility] and is on Actemra for Lupus and prednisone for PMR  DM:  She has DM but denies numbness in her toes.    Mood/Insomnia:   Insomnia has done better with gabapentin and doxepin at bedtime.Marland Kitchen.         REVIEW OF SYSTEMS: Constitutional: No fevers, chills, sweats, or change in appetite Eyes: No visual changes, double vision, eye pain Ear, nose and throat: No hearing loss, ear pain, nasal congestion, sore throat Cardiovascular: No chest pain, palpitations Respiratory: No shortness of breath at rest or with exertion.   No wheezes GastrointestinaI: No nausea, vomiting, diarrhea, abdominal pain, fecal incontinence Genitourinary: No dysuria, urinary retention or frequency.  No nocturia. Musculoskeletal: Mild neck pain.  Notes left shoulder pain and bilat back pain  Integumentary: No rash, pruritus, skin lesions Neurological: as above Psychiatric: No depression at this time.  No anxiety Endocrine: No palpitations, diaphoresis, change in appetite, change in weigh or increased thirst Hematologic/Lymphatic: No anemia, purpura, petechiae. Allergic/Immunologic: No itchy/runny eyes, nasal congestion, recent allergic reactions, rashes  ALLERGIES: Allergies  Allergen Reactions  . Canagliflozin Other (See Comments)    Other reaction(s): Other (See Comments) unknown unknown  . Propoxyphene Other (See Comments)    unknown    HOME MEDICATIONS:  Current  Outpatient Medications:  .  amLODipine (NORVASC) 5 MG tablet, Take 5 mg by mouth., Disp: , Rfl:  .  aspirin EC 81 MG tablet, Take 81 mg by mouth daily., Disp: , Rfl:  .  brinzolamide (AZOPT) 1 % ophthalmic suspension, Frequency:   Dosage:0.0     Instructions:  Note:, Disp: , Rfl:  .  budesonide-formoterol (SYMBICORT) 160-4.5 MCG/ACT inhaler, Frequency:   Dosage:0.0     Instructions:  Note:, Disp: , Rfl:  .  carvedilol (COREG) 25 MG tablet, Take by mouth., Disp: , Rfl:  .  Cyanocobalamin (VITAMIN B-12 CR) 1000 MCG TBCR, Frequency:   Dosage:0.0     Instructions:  Note:, Disp: , Rfl:  .  diclofenac sodium (VOLTAREN) 1 % GEL, Apply 2 g 3 (three) times daily topically. Frequency:   Dosage:0.0     Instructions:  Note:, Disp: 200 g, Rfl: 3 .  Ergocalciferol (VITAMIN D2) 2000 UNITS TABS, Frequency:   Dosage:0     Instructions:  Note:take 5000 iu everyday, Disp: , Rfl:  .  fluticasone (FLONASE) 50 MCG/ACT nasal spray, Frequency:   Dosage:0.0     Instructions:  Note:, Disp: , Rfl:  .  gabapentin (NEURONTIN) 300 MG capsule, TAKE 1 CAPSULE IN THE MORNING, TAKE 1 CAPSULE IN THE EVENING, AND TAKE 2 CAPSULES AT NIGHT, Disp: 360 capsule, Rfl: 1 .  glucose blood (ONE TOUCH ULTRA TEST) test strip, Use 1 test strip to check blood sugar twice daily, Disp: , Rfl:  .  HYDROcodone-acetaminophen (NORCO) 7.5-325 MG tablet, Take 1 tablet by mouth 3 (three) times daily as needed., Disp: 90 tablet, Rfl: 0 .  Insulin Degludec (TRESIBA [place]), Inject into the skin., Disp: , Rfl:  .  losartan-hydrochlorothiazide (HYZAAR) 100-12.5 MG per tablet, Take by mouth., Disp: , Rfl:  .  Multiple Vitamin (MULTIVITAMIN) capsule, Take by mouth., Disp: , Rfl:  .  pantoprazole (PROTONIX) 40 MG tablet, Take 40 mg by mouth., Disp: , Rfl:  .  pravastatin (PRAVACHOL) 40 MG tablet, Take by mouth., Disp: , Rfl:  .  Tocilizumab (ACTEMRA IV), Inject into the vein every 30 (thirty) days., Disp: , Rfl:  .  cyclobenzaprine (FLEXERIL) 5 MG tablet, Take one  po at bedtime nightly and can take up to 3 a day as needed, Disp: 90 tablet, Rfl: 5 .  doxepin (SINEQUAN) 10 MG capsule, Take 1 capsule (10 mg total) by mouth at bedtime. (Patient not taking: Reported on 01/02/2017), Disp: 305 capsule, Rfl: 5 .  insulin glargine (LANTUS) 100 UNIT/ML injection, Inject into the skin., Disp: , Rfl:  .  metFORMIN (GLUCOPHAGE) 500 MG tablet, Take by mouth., Disp: , Rfl:  .  methylPREDNISolone acetate (DEPO-MEDROL) 40 MG/ML injection, Inject 40 mg into the muscle., Disp: , Rfl:  .  potassium chloride (K-DUR) 10 MEQ tablet, Take by mouth., Disp: , Rfl:  .  predniSONE (DELTASONE) 5 MG tablet, Take by mouth., Disp: , Rfl:  .  PROCTOSOL HC 2.5 % rectal cream, INSERT 1 APPLICATORFUL RECTALLY AT BEDTIME  AS NEEDED., Disp: , Rfl: 0  PAST MEDICAL HISTORY: Past Medical History:  Diagnosis Date  . Diabetes mellitus without complication (HCC)   . Hypertension   . Neuropathy   . Vertebral fracture, osteoporotic (HCC)     PAST SURGICAL HISTORY: History reviewed. No pertinent surgical history.  FAMILY HISTORY: History reviewed. No pertinent family history.  SOCIAL HISTORY:  Social History   Socioeconomic History  . Marital status: Divorced    Spouse name: Not on file  . Number of children: Not on file  . Years of education: Not on file  . Highest education level: Not on file  Social Needs  . Financial resource strain: Not on file  . Food insecurity - worry: Not on file  . Food insecurity - inability: Not on file  . Transportation needs - medical: Not on file  . Transportation needs - non-medical: Not on file  Occupational History  . Not on file  Tobacco Use  . Smoking status: Never Smoker  . Smokeless tobacco: Never Used  Substance and Sexual Activity  . Alcohol use: No    Alcohol/week: 0.0 oz  . Drug use: No  . Sexual activity: Not on file  Other Topics Concern  . Not on file  Social History Narrative  . Not on file     PHYSICAL EXAM  Vitals:    05/14/17 1055  BP: (!) 142/75  Pulse: 92  Resp: 20  Weight: 206 lb (93.4 kg)  Height: 5' 3.5" (1.613 m)    Body mass index is 35.92 kg/m.   General: The patient is well-developed and well-nourished and in no acute distress  Neck: The neck is supple   The neck is nontender.  Musculoskeletal: She has tenderness over the left subacromial bursa.  Range of motion is reduced in the left shoulder.  Tenderness increases with elevation and external rotation.  She also has a fair amount of tenderness over the piriformis muscles greater than the lower lumbar paraspinal muscles in the lower back. \  Neurologic Exam  Mental status: The patient is alert and oriented x 3 at the time of the examination. The patient has apparent normal recent and remote memory, with an apparently normal attention span and concentration ability.   Speech is normal.  Cranial nerves: Extraocular movements are full.   Facial strength and sensation is normal..   Trapezius and sternocleidomastoid strength is normal. No dysarthria is noted.     Motor:  Muscle bulk is normal.   Tone is normal. Strength is  5 / 5 in all 4 extremities.   Sensory: She had intact sensation to touch in the arms and legs.  Gait and station: She needs to use her arms to stand up.  Once up station is normal.  The gait is arthritic.  She has difficulty doing a tandem walk.  Reflexes: Deep tendon reflexes are symmetric and 1 in arms and absent in legs bilaterally.       DIAGNOSTIC DATA (LABS, IMAGING, TESTING) - I reviewed patient records, labs, notes, testing and imaging myself where available.     ASSESSMENT AND PLAN  Chronic bilateral low back pain with bilateral sciatica  Pain in joint of left shoulder  Bilateral low back pain with sciatica, sciatica laterality unspecified, unspecified chronicity   1.    Bilateral piriformis muscle trigger point injections with 60 mg Depo-Medrol in 5 mL Marcaine using sterile technique.. She  tolerated the procedure well and there were no complications.   2.  Left subacromial bursa injection with 20 mg Depo-Medrol in 2.5 mL Marcaine using sterile technique. She tolerated the procedure well and there were no complications. Pain and ROM improved.  3.  I refilled the hydrocodone and will have her restart cyclobenzaprine at night  4   Stay active, exercise and try to lose weight 5.  She will return to see me in 4 months,  sooner if she has new or worsening neurologic symptoms.   Melany Wiesman A. Epimenio Foot, MD, PhD 05/14/2017, 12:39 PM Certified in Neurology, Clinical Neurophysiology, Sleep Medicine, Pain Medicine and Neuroimaging  [facility] [address] [place], [place] [address] [phone] p

## 2017-07-18 ENCOUNTER — Telehealth: Payer: Self-pay | Admitting: Neurology

## 2017-07-18 NOTE — Telephone Encounter (Signed)
Pt complains of neck pain on the right side behind the ear down to neck for the past 2 weeks. Pt said most of the pain in head above the rt ear. She states she thought this was hurting because of the way she slept but it has continued to get worse. Pt is wanting to know if she could come in on Tuesday for an injection. Please call to advise. She is aware the clinic closes at noon today and is close on Monday for the holiday

## 2017-07-18 NOTE — Telephone Encounter (Signed)
Spoke with Kerry Brooks.  She c/o a new pain near right ear and radiating downward into neck.  Sts. has an active UTI at this time--seeing Urology for same.  Has also been treated for URI, allergies. I have explained that this does not sound like something Dr. Epimenio Foot would be able to treat with a trigger point injection, and he would not be able to give a tpi while she has an active UTI.  He has not treated her for this pain before--she should see her pcp.  She asked if x-rays can be ordered; I have explained that she needs to be seen first--x-rays may not be needed or appropriate.  She verbalized understanding of same/fim

## 2017-07-22 ENCOUNTER — Ambulatory Visit: Payer: Self-pay | Admitting: Neurology

## 2017-09-02 ENCOUNTER — Telehealth: Payer: Self-pay | Admitting: *Deleted

## 2017-09-02 MED ORDER — HYDROCODONE-ACETAMINOPHEN 7.5-325 MG PO TABS: 1.0000 | ORAL_TABLET | Freq: Three times a day (TID) | ORAL | 0 refills | Status: DC | PRN

## 2017-09-02 NOTE — Telephone Encounter (Signed)
Received written request in mail from pt., for Hydrocodone r/f.  Rx. up front [facility], and I have spoken with [name] to let her know it is available to be picked up/fim

## 2017-09-16 ENCOUNTER — Ambulatory Visit (INDEPENDENT_AMBULATORY_CARE_PROVIDER_SITE_OTHER): Payer: Medicare Other | Admitting: Neurology

## 2017-09-16 ENCOUNTER — Encounter: Payer: Self-pay | Admitting: Neurology

## 2017-09-16 VITALS — BP 121/59 | HR 81 | Resp 18 | Ht 63.5 in | Wt 207.5 lb

## 2017-09-16 DIAGNOSIS — M47892 Other spondylosis, cervical region: Secondary | ICD-10-CM | POA: Diagnosis not present

## 2017-09-16 DIAGNOSIS — M5441 Lumbago with sciatica, right side: Secondary | ICD-10-CM

## 2017-09-16 DIAGNOSIS — M25511 Pain in right shoulder: Secondary | ICD-10-CM

## 2017-09-16 DIAGNOSIS — M25512 Pain in left shoulder: Secondary | ICD-10-CM | POA: Diagnosis not present

## 2017-09-16 DIAGNOSIS — M353 Polymyalgia rheumatica: Secondary | ICD-10-CM

## 2017-09-16 DIAGNOSIS — M5442 Lumbago with sciatica, left side: Secondary | ICD-10-CM

## 2017-09-16 DIAGNOSIS — G8929 Other chronic pain: Secondary | ICD-10-CM | POA: Diagnosis not present

## 2017-09-16 MED ORDER — HYDROCODONE-ACETAMINOPHEN 7.5-325 MG PO TABS: 1.0000 | ORAL_TABLET | Freq: Three times a day (TID) | ORAL | 0 refills | Status: DC | PRN

## 2017-09-16 NOTE — Progress Notes (Signed)
[facility]  PATIENT: Kerry Brooks DOB: January 26, 1941  REFERRING DOCTOR OR PCP:  Brooke Bonito  SOURCE: patient  _________________________________   HISTORICAL  CHIEF COMPLAINT:  Chief Complaint  Patient presents with  . Back Pain    Sts. lbp is about the same.  Knees are some better since she got injections in both knees by ortho on 09/08/17 (blood sugars in the 300's after injections, per pt)  PCP dx. occipital neuralgia in June when she presented with h/a, neck pain.  MRI cervical spine done at [facility] on 07/31/17.  Today she would like tpi's in left shoulder, lower back if appropriate.  Also sts. has been dealing with frequent uti's; has an appt. with urology to investigate further/fim  . Left Shoulder Pain  . Bilateral Knee Pain    HISTORY OF PRESENT ILLNESS:  Kerry Brooks is a 77 yo woman with LBP, left shoulder pain and knee pain.    Update 09/16/2017: Her worse pain is in the lower back across both sides into the buttocks.  Pain is worse with walking.    TPI's have helped in the past.    Currently L = R.    She had a lot of neck pain and headache, right > left.   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   She was diagnosed with occipital neuralgia.    She continues to note bilateral shoulder pain, left > right.   She had a recent bilateral knee injection and she noted her sugars increased over 300.     IMPRESSION: 1. Minimal grade 1 C4-5 anterolisthesis on a degenerative basis. No acute osseous process. 2. Mild low-grade upper cervical paraspinal muscle strain. 3. Mild canal stenosis at C5-6 in part due to 3 x 4 x 6 mm contiguous disc extrusion which mildly deforms the ventral spinal cord. Mild canal stenosis C3-4. 4. Neural foraminal narrowing C3-4 through C6-7: Severe on the left at C3-4, severe on the right at C4-5.  She is reporting she is sleeping better with gabapentin and doxepin.    Flexeril in the past made her too  sleepy.  Update 05/14/2017: She is reporting more pain in the left shoulder and the right > left buttock.   Shoulder pain is worse with external rotation and elevation.     She does not note arm weakness (but limited by pain).   No radiation of pain into the arm.    The buttock pain is going a little lower but not much into the leg.  She has had generalized muscle aches that worsened when she went on Crestor and improved when she stropped.     Insomnia is better with gabapentin and doxepin  Update 01/02/2017:    Currently,  the worse pain is across her back.  Pain increases with chores/exertion.    The pain is generally reduced when she is sitting. This is similar to pain that she has had in the past that improved with trigger point injections. When the pain is more severe she is taking the hydrocodone, usually 2 times a day.  She also reprorts pain in her left shoulder.  Pain increases with lifting and external rotation.   The pain is improved if the shoulder is relaxed and she holds her arm into her chest. This is similar to pain that she has experienced in the past that improved with a subacromial bursa injection.  Volltaren gel helped but was expensive  Insomnia has done better with  a combination of gabapentin and doxepin. She tolerates these medications well.  -  From 09/10/2016: Left sided sciatica:   She reports pain in her lower back and buttock and down the left leg   In the past, LBP/buttock pain improved after piriformis muscle injections.  Pain has increased over the last month on the left.  The right back and leg are fine. When intense, the pain will radiate down the left posterior leg.  She has noted some dysesthesias in her left leg (from buttock to ankle).   Gabapentin and hydrocodone have helped the pain in the past.   She takes 1-2 hydrocodones a day if pain flares up  Neck/shoulder pain:   She feels her neck and shoulder pain are not too bad at the current time. In the past, she  received benefit from subacromial bursa injections.   Lupus/PMR/Knee pain:   [facility] and is on Actemra for Lupus and prednisone for PMR  DM:  She has DM but denies numbness in her toes.    Mood/Insomnia:   Insomnia has done better with gabapentin and doxepin at bedtime.Marland Kitchen.         REVIEW OF SYSTEMS: Constitutional: No fevers, chills, sweats, or change in appetite Eyes: No visual changes, double vision, eye pain Ear, nose and throat: No hearing loss, ear pain, nasal congestion, sore throat Cardiovascular: No chest pain, palpitations Respiratory: No shortness of breath at rest or with exertion.   No wheezes GastrointestinaI: No nausea, vomiting, diarrhea, abdominal pain, fecal incontinence Genitourinary: No dysuria, urinary retention or frequency.  No nocturia. Musculoskeletal: Mild neck pain.  Notes left shoulder pain and bilat back pain Integumentary: No rash, pruritus, skin lesions Neurological: as above Psychiatric: No depression at this time.  No anxiety Endocrine: No palpitations, diaphoresis, change in appetite, change in weigh or increased thirst Hematologic/Lymphatic: No anemia, purpura, petechiae. Allergic/Immunologic: No itchy/runny eyes, nasal congestion, recent allergic reactions, rashes  ALLERGIES: Allergies  Allergen Reactions  . Canagliflozin Other (See Comments)    Other reaction(s): Other (See Comments) unknown unknown  . Propoxyphene Other (See Comments)    unknown    HOME MEDICATIONS:  Current Outpatient Medications:  .  amLODipine (NORVASC) 5 MG tablet, Take 5 mg by mouth., Disp: , Rfl:  .  aspirin EC 81 MG tablet, Take 81 mg by mouth daily., Disp: , Rfl:  .  brinzolamide (AZOPT) 1 % ophthalmic suspension, Frequency:   Dosage:0.0     Instructions:  Note:, Disp: , Rfl:  .  budesonide-formoterol (SYMBICORT) 160-4.5 MCG/ACT inhaler, Frequency:   Dosage:0.0     Instructions:  Note:, Disp: , Rfl:  .  carvedilol (COREG) 25 MG tablet, Take by  mouth., Disp: , Rfl:  .  Cyanocobalamin (VITAMIN B-12 CR) 1000 MCG TBCR, Frequency:   Dosage:0.0     Instructions:  Note:, Disp: , Rfl:  .  cyclobenzaprine (FLEXERIL) 5 MG tablet, Take one po at bedtime nightly and can take up to 3 a day as needed, Disp: 90 tablet, Rfl: 5 .  diclofenac sodium (VOLTAREN) 1 % GEL, Apply 2 g 3 (three) times daily topically. Frequency:   Dosage:0.0     Instructions:  Note:, Disp: 200 g, Rfl: 3 .  doxepin (SINEQUAN) 10 MG capsule, Take 1 capsule (10 mg total) by mouth at bedtime., Disp: 305 capsule, Rfl: 5 .  Ergocalciferol (VITAMIN D2) 2000 UNITS TABS, Frequency:   Dosage:0     Instructions:  Note:take 5000 iu everyday, Disp: , Rfl:  .  fluticasone (FLONASE) 50 MCG/ACT nasal spray, Frequency:   Dosage:0.0     Instructions:  Note:, Disp: , Rfl:  .  gabapentin (NEURONTIN) 300 MG capsule, TAKE 1 CAPSULE IN THE MORNING, TAKE 1 CAPSULE IN THE EVENING, AND TAKE 2 CAPSULES AT NIGHT, Disp: 360 capsule, Rfl: 1 .  glucose blood (ONE TOUCH ULTRA TEST) test strip, Use 1 test strip to check blood sugar twice daily, Disp: , Rfl:  .  HYDROcodone-acetaminophen (NORCO) 7.5-325 MG tablet, Take 1 tablet by mouth 3 (three) times daily as needed., Disp: 90 tablet, Rfl: 0 .  Insulin Degludec (TRESIBA [place]), Inject into the skin., Disp: , Rfl:  .  insulin glargine (LANTUS) 100 UNIT/ML injection, Inject into the skin., Disp: , Rfl:  .  losartan-hydrochlorothiazide (HYZAAR) 100-12.5 MG per tablet, Take by mouth., Disp: , Rfl:  .  metFORMIN (GLUCOPHAGE) 500 MG tablet, Take by mouth., Disp: , Rfl:  .  methylPREDNISolone acetate (DEPO-MEDROL) 40 MG/ML injection, Inject 40 mg into the muscle., Disp: , Rfl:  .  Multiple Vitamin (MULTIVITAMIN) capsule, Take by mouth., Disp: , Rfl:  .  pantoprazole (PROTONIX) 40 MG tablet, Take 40 mg by mouth., Disp: , Rfl:  .  potassium chloride (K-DUR) 10 MEQ tablet, Take by mouth., Disp: , Rfl:  .  pravastatin (PRAVACHOL) 40 MG tablet, Take by mouth., Disp: , Rfl:    .  predniSONE (DELTASONE) 5 MG tablet, Take by mouth., Disp: , Rfl:  .  PROCTOSOL HC 2.5 % rectal cream, INSERT 1 APPLICATORFUL RECTALLY AT BEDTIME AS NEEDED., Disp: , Rfl: 0 .  Tocilizumab (ACTEMRA IV), Inject into the vein every 30 (thirty) days., Disp: , Rfl:  .  montelukast (SINGULAIR) 10 MG tablet, TAKE 1 TABLET BY MOUTH EVERY DAY AT NIGHT, Disp: , Rfl: 11  PAST MEDICAL HISTORY: Past Medical History:  Diagnosis Date  . Diabetes mellitus without complication (HCC)   . Hypertension   . Neuropathy   . Vertebral fracture, osteoporotic (HCC)     PAST SURGICAL HISTORY: History reviewed. No pertinent surgical history.  FAMILY HISTORY: History reviewed. No pertinent family history.  SOCIAL HISTORY:  Social History   Socioeconomic History  . Marital status: Divorced    Spouse name: Not on file  . Number of children: Not on file  . Years of education: Not on file  . Highest education level: Not on file  Occupational History  . Not on file  Social Needs  . Financial resource strain: Not on file  . Food insecurity:    Worry: Not on file    Inability: Not on file  . Transportation needs:    Medical: Not on file    Non-medical: Not on file  Tobacco Use  . Smoking status: Never Smoker  . Smokeless tobacco: Never Used  Substance and Sexual Activity  . Alcohol use: No    Alcohol/week: 0.0 oz  . Drug use: No  . Sexual activity: Not on file  Lifestyle  . Physical activity:    Days per week: Not on file    Minutes per session: Not on file  . Stress: Not on file  Relationships  . Social connections:    Talks on phone: Not on file    Gets together: Not on file    Attends religious service: Not on file    Active member of club or organization: Not on file    Attends meetings of clubs or organizations: Not on file    Relationship status: Not on file  .  Intimate partner violence:    Fear of current or ex partner: Not on file    Emotionally abused: Not on file     Physically abused: Not on file    Forced sexual activity: Not on file  Other Topics Concern  . Not on file  Social History Narrative  . Not on file     PHYSICAL EXAM  Vitals:   09/16/17 1053  BP: (!) 121/59  Pulse: 81  Resp: 18  Weight: 207 lb 8 oz (94.1 kg)  Height: 5' 3.5" (1.613 m)    Body mass index is 36.18 kg/m.   General: The patient is well-developed and well-nourished and in no acute distress  Neck: The neck is supple   The neck is nontender.  Musculoskeletal: She has tenderness over the left subacromial bursa.  Range of motion is reduced in the left shoulder.  Tenderness increases with elevation and external rotation.  She also has a fair amount of tenderness over the piriformis muscles greater than the lower lumbar paraspinal muscles in the lower back. \  Neurologic Exam  Mental status: The patient is alert and oriented x 3 at the time of the examination. The patient has apparent normal recent and remote memory, with an apparently normal attention span and concentration ability.   Speech is normal.  Cranial nerves: Extraocular movements are full.   Facial strength and sensation is normal..   Trapezius and sternocleidomastoid strength is normal. No dysarthria is noted.     Motor:  Muscle bulk is normal.   Tone is normal. Strength is  5 / 5 in all 4 extremities.   Sensory: She had intact sensation to touch in the arms and legs.  Gait and station: She needs to use her arms to stand up.  Once up station is normal.  The gait is arthritic.  She has difficulty doing a tandem walk.  Reflexes: Deep tendon reflexes are symmetric and 1 in arms and absent in legs bilaterally.       DIAGNOSTIC DATA (LABS, IMAGING, TESTING) - I reviewed patient records, labs, notes, testing and imaging myself where available.     ASSESSMENT AND PLAN  Chronic bilateral low back pain with bilateral sciatica - Plan: DME Other see comment  Other osteoarthritis of spine, cervical  region - Plan: DME Other see comment  Pain of both shoulder joints - Plan: DME Other see comment  Polymyalgia rheumatica (HCC) - Plan: DME Other see comment   1.    Bilateral piriformis muscle and L4L5 paraspinal muscles with trigger point injections with 40 mg Depo-Medrol in 5 mL Marcaine using sterile technique.. She tolerated the procedure well and there were no complications.   2.   Left subacromial bursa injection with 20 mg Depo-Medrol in 2.0 mL Marcaine using sterile technique. She tolerated the procedure well and there were no complications. Pain and ROM improved.  3.  I refilled the hydrocodone and will have her restart cyclobenzaprine at night  4   Stay active, exercise and try to lose weight 5.  She will return to see me in 4 months,  sooner if she has new or worsening neurologic symptoms.   Richard A. Epimenio Foot, MD, PhD 09/16/2017, 11:19 AM Certified in Neurology, Clinical Neurophysiology, Sleep Medicine, Pain Medicine and Neuroimaging  [facility] [address], [address] [place], [place] [address] [phone] p

## 2017-11-05 ENCOUNTER — Other Ambulatory Visit: Payer: Self-pay | Admitting: Neurology

## 2017-11-07 ENCOUNTER — Other Ambulatory Visit: Payer: Self-pay

## 2017-11-07 ENCOUNTER — Inpatient Hospital Stay (HOSPITAL_BASED_OUTPATIENT_CLINIC_OR_DEPARTMENT_OTHER)
Admission: EM | Admit: 2017-11-07 | Discharge: 2017-11-12 | DRG: 546 | Disposition: A | Discharge status: Skilled Nursing Facility | Payer: Medicare Other | Attending: Family Medicine | Admitting: Family Medicine

## 2017-11-07 ENCOUNTER — Emergency Department (HOSPITAL_BASED_OUTPATIENT_CLINIC_OR_DEPARTMENT_OTHER): Payer: Medicare Other

## 2017-11-07 ENCOUNTER — Observation Stay (HOSPITAL_COMMUNITY): Payer: Medicare Other

## 2017-11-07 ENCOUNTER — Encounter (HOSPITAL_BASED_OUTPATIENT_CLINIC_OR_DEPARTMENT_OTHER): Payer: Self-pay

## 2017-11-07 DIAGNOSIS — N390 Urinary tract infection, site not specified: Secondary | ICD-10-CM | POA: Diagnosis present

## 2017-11-07 DIAGNOSIS — R2990 Unspecified symptoms and signs involving the nervous system: Secondary | ICD-10-CM

## 2017-11-07 DIAGNOSIS — E1165 Type 2 diabetes mellitus with hyperglycemia: Secondary | ICD-10-CM | POA: Diagnosis present

## 2017-11-07 DIAGNOSIS — E871 Hypo-osmolality and hyponatremia: Secondary | ICD-10-CM | POA: Diagnosis present

## 2017-11-07 DIAGNOSIS — I7300 Raynaud's syndrome without gangrene: Secondary | ICD-10-CM | POA: Diagnosis not present

## 2017-11-07 DIAGNOSIS — R778 Other specified abnormalities of plasma proteins: Secondary | ICD-10-CM | POA: Diagnosis present

## 2017-11-07 DIAGNOSIS — I10 Essential (primary) hypertension: Secondary | ICD-10-CM | POA: Diagnosis not present

## 2017-11-07 DIAGNOSIS — Z885 Allergy status to narcotic agent status: Secondary | ICD-10-CM

## 2017-11-07 DIAGNOSIS — R7989 Other specified abnormal findings of blood chemistry: Secondary | ICD-10-CM | POA: Diagnosis not present

## 2017-11-07 DIAGNOSIS — E1142 Type 2 diabetes mellitus with diabetic polyneuropathy: Secondary | ICD-10-CM | POA: Diagnosis present

## 2017-11-07 DIAGNOSIS — M25511 Pain in right shoulder: Secondary | ICD-10-CM

## 2017-11-07 DIAGNOSIS — R748 Abnormal levels of other serum enzymes: Secondary | ICD-10-CM | POA: Diagnosis not present

## 2017-11-07 DIAGNOSIS — Z79899 Other long term (current) drug therapy: Secondary | ICD-10-CM

## 2017-11-07 DIAGNOSIS — M329 Systemic lupus erythematosus, unspecified: Secondary | ICD-10-CM | POA: Diagnosis not present

## 2017-11-07 DIAGNOSIS — Z6835 Body mass index (BMI) 35.0-35.9, adult: Secondary | ICD-10-CM | POA: Diagnosis not present

## 2017-11-07 DIAGNOSIS — Z7982 Long term (current) use of aspirin: Secondary | ICD-10-CM | POA: Diagnosis not present

## 2017-11-07 DIAGNOSIS — J4530 Mild persistent asthma, uncomplicated: Secondary | ICD-10-CM | POA: Diagnosis not present

## 2017-11-07 DIAGNOSIS — T380X5A Adverse effect of glucocorticoids and synthetic analogues, initial encounter: Secondary | ICD-10-CM | POA: Diagnosis present

## 2017-11-07 DIAGNOSIS — E119 Type 2 diabetes mellitus without complications: Secondary | ICD-10-CM

## 2017-11-07 DIAGNOSIS — R531 Weakness: Secondary | ICD-10-CM | POA: Diagnosis not present

## 2017-11-07 DIAGNOSIS — Z87310 Personal history of (healed) osteoporosis fracture: Secondary | ICD-10-CM | POA: Diagnosis not present

## 2017-11-07 DIAGNOSIS — E876 Hypokalemia: Secondary | ICD-10-CM | POA: Diagnosis not present

## 2017-11-07 DIAGNOSIS — I4510 Unspecified right bundle-branch block: Secondary | ICD-10-CM | POA: Diagnosis present

## 2017-11-07 DIAGNOSIS — Z7951 Long term (current) use of inhaled steroids: Secondary | ICD-10-CM

## 2017-11-07 DIAGNOSIS — Z794 Long term (current) use of insulin: Secondary | ICD-10-CM

## 2017-11-07 DIAGNOSIS — E669 Obesity, unspecified: Secondary | ICD-10-CM | POA: Diagnosis not present

## 2017-11-07 DIAGNOSIS — M7531 Calcific tendinitis of right shoulder: Secondary | ICD-10-CM | POA: Diagnosis present

## 2017-11-07 DIAGNOSIS — G8321 Monoplegia of upper limb affecting right dominant side: Secondary | ICD-10-CM | POA: Diagnosis present

## 2017-11-07 DIAGNOSIS — Z888 Allergy status to other drugs, medicaments and biological substances status: Secondary | ICD-10-CM | POA: Diagnosis not present

## 2017-11-07 DIAGNOSIS — M353 Polymyalgia rheumatica: Secondary | ICD-10-CM | POA: Diagnosis present

## 2017-11-07 DIAGNOSIS — Z7952 Long term (current) use of systemic steroids: Secondary | ICD-10-CM

## 2017-11-07 DIAGNOSIS — K219 Gastro-esophageal reflux disease without esophagitis: Secondary | ICD-10-CM | POA: Diagnosis present

## 2017-11-07 DIAGNOSIS — R0989 Other specified symptoms and signs involving the circulatory and respiratory systems: Secondary | ICD-10-CM | POA: Diagnosis present

## 2017-11-07 DIAGNOSIS — I639 Cerebral infarction, unspecified: Secondary | ICD-10-CM | POA: Diagnosis present

## 2017-11-07 HISTORY — DX: Systemic lupus erythematosus, unspecified: M32.9

## 2017-11-07 HISTORY — DX: Unspecified asthma, uncomplicated: J45.909

## 2017-11-07 HISTORY — DX: Raynaud's syndrome without gangrene: I73.00

## 2017-11-07 LAB — URINALYSIS, ROUTINE W REFLEX MICROSCOPIC
Bilirubin Urine: NEGATIVE
GLUCOSE, UA: 100 mg/dL — AB
Hgb urine dipstick: NEGATIVE
Ketones, ur: NEGATIVE mg/dL
Nitrite: NEGATIVE
PH: 6.5 (ref 5.0–8.0)
Protein, ur: NEGATIVE mg/dL
SPECIFIC GRAVITY, URINE: 1.015 (ref 1.005–1.030)

## 2017-11-07 LAB — ETHANOL: Alcohol, Ethyl (B): <10 mg/dL (ref <10)

## 2017-11-07 LAB — URINALYSIS, MICROSCOPIC (REFLEX)

## 2017-11-07 LAB — COMPREHENSIVE METABOLIC PANEL
ALT: 18 U/L (ref 0–44)
ANION GAP: 13 (ref 5–15)
AST: 28 U/L (ref 15–41)
Albumin: 4 g/dL (ref 3.5–5.0)
Alkaline Phosphatase: 66 U/L (ref 38–126)
BUN: 13 mg/dL (ref 8–23)
CHLORIDE: 99 mmol/L (ref 98–111)
CO2: 25 mmol/L (ref 22–32)
Calcium: 8.7 mg/dL — ABNORMAL LOW (ref 8.9–10.3)
Creatinine, Ser: 0.57 mg/dL (ref 0.44–1.00)
Glucose, Bld: 171 mg/dL — ABNORMAL HIGH (ref 70–99)
Potassium: 3.2 mmol/L — ABNORMAL LOW (ref 3.5–5.1)
SODIUM: 137 mmol/L (ref 135–145)
Total Bilirubin: 0.6 mg/dL (ref 0.3–1.2)
Total Protein: 7 g/dL (ref 6.5–8.1)

## 2017-11-07 LAB — DIFFERENTIAL
BASOS PCT: 0 %
Basophils Absolute: 0 10*3/uL (ref 0.0–0.1)
EOS ABS: 0.2 10*3/uL (ref 0.0–0.7)
EOS PCT: 3 %
LYMPHS PCT: 21 %
Lymphs Abs: 1.3 10*3/uL (ref 0.7–4.0)
MONOS PCT: 11 %
Monocytes Absolute: 0.7 10*3/uL (ref 0.1–1.0)
NEUTROS ABS: 4.2 10*3/uL (ref 1.7–7.7)
Neutrophils Relative %: 65 %

## 2017-11-07 LAB — RAPID URINE DRUG SCREEN, HOSP PERFORMED
AMPHETAMINES: NOT DETECTED
Barbiturates: NOT DETECTED
Benzodiazepines: NOT DETECTED
COCAINE: NOT DETECTED
OPIATES: POSITIVE — AB
Tetrahydrocannabinol: NOT DETECTED

## 2017-11-07 LAB — TROPONIN I
TROPONIN I: 0.05 ng/mL — AB (ref <0.03)
TROPONIN I: 0.06 ng/mL — AB (ref <0.03)
TROPONIN I: 0.04 ng/mL — AB (ref <0.03)

## 2017-11-07 LAB — CBC
HEMATOCRIT: 37.1 % (ref 36.0–46.0)
Hemoglobin: 12.2 g/dL (ref 12.0–15.0)
MCH: 30 pg (ref 26.0–34.0)
MCHC: 32.9 g/dL (ref 30.0–36.0)
MCV: 91.4 fL (ref 78.0–100.0)
Platelets: 122 10*3/uL — ABNORMAL LOW (ref 150–400)
RBC: 4.06 MIL/uL (ref 3.87–5.11)
RDW: 13 % (ref 11.5–15.5)
WBC: 6.3 10*3/uL (ref 4.0–10.5)

## 2017-11-07 LAB — PROTIME-INR
INR: 1.02
PROTHROMBIN TIME: 13.3 s (ref 11.4–15.2)

## 2017-11-07 LAB — CBG MONITORING, ED: Glucose-Capillary: 164 mg/dL — ABNORMAL HIGH (ref 70–99)

## 2017-11-07 LAB — APTT: APTT: 27 s (ref 24–36)

## 2017-11-07 MED ORDER — MORPHINE SULFATE (PF) 4 MG/ML IV SOLN: 3.0000 mg | INTRAVENOUS | Status: DC | PRN

## 2017-11-07 MED ORDER — PANTOPRAZOLE SODIUM 40 MG PO TBEC
40.0000 mg | DELAYED_RELEASE_TABLET | Freq: Every day | ORAL | Status: DC
  Administered 2017-11-08 – 2017-11-12 (×5): 40 mg via ORAL
  Filled 2017-11-07 (×5): qty 1

## 2017-11-07 MED ORDER — ALBUTEROL SULFATE (2.5 MG/3ML) 0.083% IN NEBU: 2.5000 mg | INHALATION_SOLUTION | Freq: Four times a day (QID) | RESPIRATORY_TRACT | Status: DC | PRN

## 2017-11-07 MED ORDER — POTASSIUM CHLORIDE CRYS ER 20 MEQ PO TBCR
20.0000 meq | EXTENDED_RELEASE_TABLET | Freq: Once | ORAL | Status: AC
  Administered 2017-11-08: 20 meq via ORAL
  Filled 2017-11-07: qty 1

## 2017-11-07 MED ORDER — ACETAMINOPHEN 325 MG PO TABS: 650.0000 mg | ORAL_TABLET | ORAL | Status: DC | PRN

## 2017-11-07 MED ORDER — GABAPENTIN 300 MG PO CAPS: 300.0000 mg | ORAL_CAPSULE | ORAL | Status: DC

## 2017-11-07 MED ORDER — PREDNISONE 5 MG PO TABS
5.0000 mg | ORAL_TABLET | Freq: Every day | ORAL | Status: DC
  Administered 2017-11-08: 5 mg via ORAL
  Filled 2017-11-07: qty 1

## 2017-11-07 MED ORDER — MONTELUKAST SODIUM 10 MG PO TABS
10.0000 mg | ORAL_TABLET | Freq: Every day | ORAL | Status: DC
  Administered 2017-11-08 – 2017-11-11 (×5): 10 mg via ORAL
  Filled 2017-11-07 (×4): qty 1

## 2017-11-07 MED ORDER — MORPHINE SULFATE (PF) 2 MG/ML IV SOLN
2.0000 mg | Freq: Once | INTRAVENOUS | Status: AC
  Administered 2017-11-07: 2 mg via INTRAVENOUS
  Filled 2017-11-07: qty 1

## 2017-11-07 MED ORDER — SODIUM CHLORIDE 0.9 % IV SOLN
1.0000 g | Freq: Every day | INTRAVENOUS | Status: DC
  Administered 2017-11-08 – 2017-11-10 (×4): 1 g via INTRAVENOUS
  Filled 2017-11-07 (×4): qty 10

## 2017-11-07 MED ORDER — DICLOFENAC SODIUM 1 % TD GEL
2.0000 g | Freq: Three times a day (TID) | TRANSDERMAL | Status: DC
  Administered 2017-11-08: 2 g via TOPICAL
  Filled 2017-11-07: qty 100

## 2017-11-07 MED ORDER — TRAMADOL HCL 50 MG PO TABS
50.0000 mg | ORAL_TABLET | Freq: Once | ORAL | Status: AC
  Administered 2017-11-08: 50 mg via ORAL
  Filled 2017-11-07: qty 1

## 2017-11-07 MED ORDER — ENOXAPARIN SODIUM 40 MG/0.4ML ~~LOC~~ SOLN
40.0000 mg | Freq: Every day | SUBCUTANEOUS | Status: DC
  Administered 2017-11-08 – 2017-11-12 (×5): 40 mg via SUBCUTANEOUS
  Filled 2017-11-07 (×5): qty 0.4

## 2017-11-07 MED ORDER — INSULIN ASPART 100 UNIT/ML ~~LOC~~ SOLN
0.0000 [IU] | Freq: Three times a day (TID) | SUBCUTANEOUS | Status: DC
  Administered 2017-11-08 (×2): 3 [IU] via SUBCUTANEOUS
  Administered 2017-11-08: 1 [IU] via SUBCUTANEOUS
  Administered 2017-11-09: 3 [IU] via SUBCUTANEOUS
  Administered 2017-11-09: 9 [IU] via SUBCUTANEOUS
  Administered 2017-11-09 – 2017-11-10 (×2): 3 [IU] via SUBCUTANEOUS

## 2017-11-07 MED ORDER — ASPIRIN EC 81 MG PO TBEC
81.0000 mg | DELAYED_RELEASE_TABLET | Freq: Every day | ORAL | Status: DC
  Administered 2017-11-08 – 2017-11-12 (×5): 81 mg via ORAL
  Filled 2017-11-07 (×5): qty 1

## 2017-11-07 MED ORDER — SENNOSIDES-DOCUSATE SODIUM 8.6-50 MG PO TABS: 1.0000 | ORAL_TABLET | Freq: Every evening | ORAL | Status: DC | PRN

## 2017-11-07 MED ORDER — POTASSIUM CHLORIDE IN NACL 20-0.9 MEQ/L-% IV SOLN
INTRAVENOUS | Status: DC
  Administered 2017-11-08: 01:00:00 via INTRAVENOUS
  Filled 2017-11-07: qty 1000

## 2017-11-07 MED ORDER — HYDROCODONE-ACETAMINOPHEN 7.5-325 MG PO TABS
1.0000 | ORAL_TABLET | Freq: Four times a day (QID) | ORAL | Status: DC | PRN
  Administered 2017-11-08 – 2017-11-12 (×8): 1 via ORAL
  Filled 2017-11-07 (×9): qty 1

## 2017-11-07 MED ORDER — MOMETASONE FURO-FORMOTEROL FUM 200-5 MCG/ACT IN AERO
2.0000 | INHALATION_SPRAY | Freq: Two times a day (BID) | RESPIRATORY_TRACT | Status: DC
  Administered 2017-11-08 – 2017-11-12 (×9): 2 via RESPIRATORY_TRACT
  Filled 2017-11-07: qty 8.8

## 2017-11-07 MED ORDER — STROKE: EARLY STAGES OF RECOVERY BOOK
Freq: Once | Status: AC
  Administered 2017-11-08

## 2017-11-07 MED ORDER — ACETAMINOPHEN 160 MG/5ML PO SOLN: 650.0000 mg | ORAL | Status: DC | PRN

## 2017-11-07 MED ORDER — ACETAMINOPHEN 650 MG RE SUPP: 650.0000 mg | RECTAL | Status: DC | PRN

## 2017-11-07 MED ORDER — ONDANSETRON HCL 4 MG/2ML IJ SOLN
INTRAMUSCULAR | Status: AC
  Filled 2017-11-07: qty 2

## 2017-11-07 MED ORDER — GABAPENTIN 300 MG PO CAPS
600.0000 mg | ORAL_CAPSULE | Freq: Every day | ORAL | Status: DC
  Administered 2017-11-08 – 2017-11-11 (×5): 600 mg via ORAL
  Filled 2017-11-07 (×5): qty 2

## 2017-11-07 MED ORDER — TRAZODONE HCL 50 MG PO TABS
50.0000 mg | ORAL_TABLET | Freq: Once | ORAL | Status: AC
  Administered 2017-11-08: 50 mg via ORAL
  Filled 2017-11-07: qty 1

## 2017-11-07 MED ORDER — INSULIN ASPART 100 UNIT/ML ~~LOC~~ SOLN
0.0000 [IU] | Freq: Every day | SUBCUTANEOUS | Status: DC
  Administered 2017-11-08 (×2): 2 [IU] via SUBCUTANEOUS
  Administered 2017-11-09: 5 [IU] via SUBCUTANEOUS

## 2017-11-07 MED ORDER — BRINZOLAMIDE 1 % OP SUSP
1.0000 [drp] | Freq: Three times a day (TID) | OPHTHALMIC | Status: DC
  Administered 2017-11-08 – 2017-11-12 (×14): 1 [drp] via OPHTHALMIC
  Filled 2017-11-07: qty 10

## 2017-11-07 MED ORDER — PRAVASTATIN SODIUM 40 MG PO TABS
40.0000 mg | ORAL_TABLET | Freq: Every day | ORAL | Status: DC
  Administered 2017-11-08 – 2017-11-09 (×2): 40 mg via ORAL
  Filled 2017-11-07 (×2): qty 1

## 2017-11-07 MED ORDER — CYCLOBENZAPRINE HCL 10 MG PO TABS
5.0000 mg | ORAL_TABLET | Freq: Three times a day (TID) | ORAL | Status: DC | PRN
  Filled 2017-11-07: qty 1

## 2017-11-07 MED ORDER — ONDANSETRON HCL 4 MG/2ML IJ SOLN
4.0000 mg | Freq: Once | INTRAMUSCULAR | Status: AC
  Administered 2017-11-07: 4 mg via INTRAVENOUS

## 2017-11-07 MED ORDER — INSULIN GLARGINE 100 UNIT/ML ~~LOC~~ SOLN
20.0000 [IU] | Freq: Every day | SUBCUTANEOUS | Status: DC
  Administered 2017-11-08: 20 [IU] via SUBCUTANEOUS
  Filled 2017-11-07 (×2): qty 0.2

## 2017-11-07 MED ORDER — GABAPENTIN 300 MG PO CAPS
300.0000 mg | ORAL_CAPSULE | Freq: Two times a day (BID) | ORAL | Status: DC
  Administered 2017-11-08 – 2017-11-12 (×9): 300 mg via ORAL
  Filled 2017-11-07 (×9): qty 1

## 2017-11-07 MED ORDER — DOXEPIN HCL 10 MG PO CAPS
10.0000 mg | ORAL_CAPSULE | Freq: Every day | ORAL | Status: DC
  Administered 2017-11-08 – 2017-11-11 (×5): 10 mg via ORAL
  Filled 2017-11-07 (×5): qty 1

## 2017-11-07 NOTE — ED Notes (Signed)
Pt on cardiac monitor and auto VS 

## 2017-11-07 NOTE — Progress Notes (Signed)
Pt off floor to MRI

## 2017-11-07 NOTE — ED Notes (Signed)
Date and time results received: 11/07/17 1921 (use smartphrase ".now" to insert current time)  Test: troponin Critical Value: 0.06  Name of Provider Notified: Dr. Jacqulyn BathLong  Orders Received? Or Actions Taken?: no new orders

## 2017-11-07 NOTE — ED Triage Notes (Signed)
Pt reports when she woke up this morning around 10 or 11 am and she was having difficulty with her right shoulder and right arm. Pt reports she has issues with her arms normally but today her right arm was weaker and she had trouble getting out of bed. Pt reports it is usually her left arm that gives her trouble. Pt reports she has bone density issues and has to take medication for that. She reports this feels like when she has her flair-ups.

## 2017-11-07 NOTE — Progress Notes (Signed)
[facility] Plan of Care Note  Patient: Kerry CrankerWillie R Gohman    ZOX:[id number]  PCP: Brooke BonitoGallemore, Warren, MD    DOB: 20-Oct-1940  DOS: 11/07/2017   Received a [facility] transfer of Ms. Kerry Brooks. Requesting: Dr. Jacqulyn BathLong Reason for transfer: CVA History: Patient presented with right arm weakness right leg weakness onset since this morning when she woke up. Exam: Profound right upper and lower extremity weakness per ED CT head negative for any acute stroke.  EDP discussed with neurology recommend admission for further work-up.  Plan of care: The patient [facility]. Please call neurology on arrival for consultation.  Author: Lynden OxfordPranav Demetre Monaco, MD [facility] Pager: [phone] 11/07/2017 4:57 PM   If 7PM-7AM, please contact night-coverage at [email or web address], password [email or web address]

## 2017-11-07 NOTE — H&P (Signed)
History and Physical    Kerry Brooks:[id number] DOB: Feb 11, 1941 DOA: 11/07/2017  PCP: Reita Cliche, MD   Patient coming from: Home, by way of [facility]   Chief Complaint: Right arm pain and weakness  HPI: Kerry Brooks is a 77 y.o. female with medical history significant for insulin-dependent diabetes mellitus, asthma, hypertension, polymyalgia rheumatica, lupus, and recent urinary symptoms started on antibiotics for UTI 2 days ago, now presenting to the emergency department with pain and weakness involving the right arm.  Patient reports that she has experienced a lot of shoulder pain, usually more so on the left, and has received intra-articular injections for this.  She had been experiencing some urinary symptoms and nausea last night, but woke this morning with severe pain in the right shoulder and arm, as well as right arm weakness.  A family member thought that one side of her face appeared swollen this morning.  She denies headache, change in vision or hearing, difficulty swallowing, or numbness.  Denies fevers, chills, chest pain, palpitations, shortness of breath, or cough.  [facility] ED Course: Upon arrival to the ED, patient is found to be afebrile, saturating well on room air, and with vitals otherwise normal.  EKG features sinus rhythm with PVCs, RBBB, and LVH with repolarization abnormality.  Radiographs of the right shoulder demonstrate degenerative changes of the right glenohumeral joint, calcific tendinitis involving the rotator cuff tendons, and widened humeral acromial space with slight inferior subluxation that could possibly be due to an effusion.  Noncontrast head CT is negative for acute intracranial abnormality and no acute osseous abnormalities were noted on the cervical spine CT.  Chemistry panel features a hypokalemia and CBC is notable for mild thrombocytopenia.  Troponin was slightly elevated.  Patient was treated with morphine and Zofran in the ED  and neurology was consulted by the ED physician, recommending transfer to [facility] for further evaluation and management of possible CVA.  Review of Systems:  All other systems reviewed and apart from HPI, are negative.  Past Medical History:  Diagnosis Date  . Asthma   . Diabetes mellitus without complication ([place])   . Hypertension   . Lupus (systemic lupus erythematosus) ([place])   . Neuropathy   . Raynaud's disease   . Vertebral fracture, osteoporotic [facility])     Past Surgical History:  Procedure Laterality Date  . TONSILLECTOMY       reports that she has never smoked. She has never used smokeless tobacco. She reports that she does not drink alcohol or use drugs.  Allergies  Allergen Reactions  . Canagliflozin Other (See Comments)    Other reaction(s): Other (See Comments) unknown unknown  . Propoxyphene Other (See Comments)    unknown    History reviewed. No pertinent family history.   Prior to Admission medications   Medication Sig Start Date End Date Taking? Authorizing Provider  Albuterol Sulfate (VENTOLIN HFA IN) Inhale into the lungs.   Yes [provider]  amLODipine (NORVASC) 5 MG tablet Take 5 mg by mouth. 08/09/16  Yes [provider]  aspirin EC 81 MG tablet Take 81 mg by mouth daily. 09/20/11  Yes [provider]  brinzolamide (AZOPT) 1 % ophthalmic suspension Frequency:   Dosage:0.0     Instructions:  Note: 09/20/11  Yes [provider]  budesonide-formoterol (SYMBICORT) 160-4.5 MCG/ACT inhaler Frequency:   Dosage:0.0     Instructions:  Note: 09/20/11  Yes [provider]  carvedilol (COREG) 25 MG tablet  Take by mouth. 09/20/11  Yes [provider]  diclofenac sodium (VOLTAREN) 1 % GEL Apply 2 g 3 (three) times daily topically. Frequency:   Dosage:0.0     Instructions:  Note: 01/02/17  Yes Sater, Nanine Means, MD  doxepin (SINEQUAN) 10 MG capsule Take 1 capsule (10 mg total) by mouth at bedtime. 05/17/14  Yes Sater,  Nanine Means, MD  gabapentin (NEURONTIN) 300 MG capsule TAKE 1 CAPSULE IN THE MORNING, TAKE 1 CAPSULE IN THE EVENING, AND TAKE 2 CAPSULES AT NIGHT 11/06/17  Yes Sater, Nanine Means, MD  Insulin Degludec (TRESIBA [place]) Inject into the skin.   Yes [provider]  losartan-hydrochlorothiazide (HYZAAR) 100-12.5 MG per tablet Take by mouth. 05/01/10  Yes [provider]  metFORMIN (GLUCOPHAGE) 500 MG tablet Take by mouth. 09/20/11  Yes [provider]  potassium chloride (K-DUR) 10 MEQ tablet Take by mouth. 05/01/10  Yes [provider]  Cyanocobalamin (VITAMIN B-12 CR) 1000 MCG TBCR Frequency:   Dosage:0.0     Instructions:  Note: 09/20/11   [provider]  cyclobenzaprine (FLEXERIL) 5 MG tablet Take one po at bedtime nightly and can take up to 3 a day as needed 05/14/17   Sater, Nanine Means, MD  Ergocalciferol (VITAMIN D2) 2000 UNITS TABS Frequency:   Dosage:0     Instructions:  Note:take 5000 iu everyday 12/06/11   [provider]  fluticasone (FLONASE) 50 MCG/ACT nasal spray Frequency:   Dosage:0.0     Instructions:  Note: 09/20/11   [provider]  glucose blood (ONE TOUCH ULTRA TEST) test strip Use 1 test strip to check blood sugar twice daily 03/20/16   [provider]  HYDROcodone-acetaminophen (NORCO) 7.5-325 MG tablet Take 1 tablet by mouth 3 (three) times daily as needed. 09/16/17   Sater, Nanine Means, MD  insulin glargine (LANTUS) 100 UNIT/ML injection Inject into the skin.    [provider]  methylPREDNISolone acetate (DEPO-MEDROL) 40 MG/ML injection Inject 40 mg into the muscle. 10/23/15   [provider]  montelukast (SINGULAIR) 10 MG tablet TAKE 1 TABLET BY MOUTH EVERY DAY AT NIGHT 08/27/17   [provider]  Multiple Vitamin (MULTIVITAMIN) capsule Take by mouth. 09/20/11   [provider]  pantoprazole (PROTONIX) 40 MG tablet Take 40 mg by mouth.    [provider]  pravastatin (PRAVACHOL) 40 MG  tablet Take by mouth.    [provider]  predniSONE (DELTASONE) 5 MG tablet Take by mouth. 09/20/11   [provider]  PROCTOSOL HC 2.5 % rectal cream INSERT 1 APPLICATORFUL RECTALLY AT BEDTIME AS NEEDED. 10/13/14   [provider]  Tocilizumab (ACTEMRA IV) Inject into the vein every 30 (thirty) days.    [provider]    Physical Exam: Vitals:   11/07/17 1900 11/07/17 1935 11/07/17 2115 11/07/17 2117  BP: (!) 157/86   107/76  Pulse: 95   86  Resp: 14   18  Temp:  98.3 F (36.8 C)  (!) 97.5 F (36.4 C)  TempSrc:    Oral  SpO2: 98%   100%  Weight:   91.6 kg   Height:   5' 3"  (1.6 m)     Constitutional: NAD, calm  Eyes: PERTLA, lids and conjunctivae normal ENMT: Mucous membranes are moist. Posterior pharynx clear of any exudate or lesions.   Neck: normal, supple, no masses, no thyromegaly Respiratory: clear to auscultation bilaterally, no wheezing, no crackles. Normal respiratory effort.    Cardiovascular: S1 &  S2 heard, regular rate and rhythm. No extremity pitting edema.  Abdomen: No distension, no tenderness, soft. Bowel sounds normal.  Musculoskeletal: no clubbing / cyanosis. No joint deformity upper and lower extremities. Tender shoulders bilaterally; pain with ROM involving right shoulder, elbow, and thumb. Skin: no significant rashes, lesions, ulcers. Warm, dry, well-perfused. Neurologic: No facial asymmetry, PERRL, EOMI. Sensation intact. Strength markedly diminished throughout RUE.   Psychiatric: Alert and oriented x 3. Pleasant and cooperative.    Labs on Admission: I have personally reviewed following labs and imaging studies  CBC: Recent Labs  Lab 11/07/17 1519  WBC 6.3  NEUTROABS 4.2  HGB 12.2  HCT 37.1  MCV 91.4  PLT 921*   Basic Metabolic Panel: Recent Labs  Lab 11/07/17 1519  NA 137  K 3.2*  CL 99  CO2 25  GLUCOSE 171*  BUN 13  CREATININE 0.57  CALCIUM 8.7*   GFR: Estimated Creatinine Clearance: 63.3 mL/min  (by C-G formula based on SCr of 0.57 mg/dL). Liver Function Tests: Recent Labs  Lab 11/07/17 1519  AST 28  ALT 18  ALKPHOS 66  BILITOT 0.6  PROT 7.0  ALBUMIN 4.0   No results for input(s): LIPASE, AMYLASE in the last 168 hours. No results for input(s): AMMONIA in the last 168 hours. Coagulation Profile: Recent Labs  Lab 11/07/17 1519  INR 1.02   Cardiac Enzymes: Recent Labs  Lab 11/07/17 1519 11/07/17 1835  TROPONINI 0.05* 0.06*   BNP (last 3 results) No results for input(s): PROBNP in the last 8760 hours. HbA1C: No results for input(s): HGBA1C in the last 72 hours. CBG: Recent Labs  Lab 11/07/17 1514  GLUCAP 164*   Lipid Profile: No results for input(s): CHOL, HDL, LDLCALC, TRIG, CHOLHDL, LDLDIRECT in the last 72 hours. Thyroid Function Tests: No results for input(s): TSH, T4TOTAL, FREET4, T3FREE, THYROIDAB in the last 72 hours. Anemia Panel: No results for input(s): VITAMINB12, FOLATE, FERRITIN, TIBC, IRON, RETICCTPCT in the last 72 hours. Urine analysis:    Component Value Date/Time   COLORURINE YELLOW 11/07/2017 1530   APPEARANCEUR CLEAR 11/07/2017 1530   APPEARANCEUR Cloudy (A) 01/02/2016 1113   LABSPEC 1.015 11/07/2017 1530   PHURINE 6.5 11/07/2017 1530   GLUCOSEU 100 (A) 11/07/2017 1530   HGBUR NEGATIVE 11/07/2017 1530   BILIRUBINUR NEGATIVE 11/07/2017 1530   BILIRUBINUR Negative 01/02/2016 1113   KETONESUR NEGATIVE 11/07/2017 1530   PROTEINUR NEGATIVE 11/07/2017 1530   UROBILINOGEN 0.2 11/03/2014 2210   NITRITE NEGATIVE 11/07/2017 1530   LEUKOCYTESUR TRACE (A) 11/07/2017 1530   LEUKOCYTESUR 3+ (A) 01/02/2016 1113   Sepsis Labs: @LABRCNTIP (procalcitonin:4,lacticidven:4) )No results found for this or any previous visit (from the past 240 hour(s)).   Radiological Exams on Admission: Dg Shoulder Right  Result Date: 11/07/2017 CLINICAL DATA:  Acute onset of right shoulder pain this morning. No known injury. EXAM: RIGHT SHOULDER - 2+ VIEW  COMPARISON:  None. FINDINGS: Moderate glenohumeral joint degenerative changes with osteophytic spurring. There are also mild to moderate AC joint degenerative changes. Widened humeroacromial space and slight inferior subluxation of the humeral head could possibly be due to a joint effusion. Bony density near the greater tuberosity likely frayed fleck ting calcific tendinitis. The visualized right ribs are intact.  No worrisome lung lesions. IMPRESSION: Moderate glenohumeral joint degenerative changes. Calcific tendinitis involving the rotator cuff tendons distally near the attachment site on the greater tuberosity. Widened humeroacromial space and slight inferior subluxation of the humeral head could be due to a joint effusion. Electronically Signed  By: Marijo Sanes M.D.   On: 11/07/2017 16:51   Ct Head Wo Contrast  Result Date: 11/07/2017 CLINICAL DATA:  77 year old with right arm weakness and numbness. Stroke suspected. EXAM: CT HEAD WITHOUT CONTRAST CT CERVICAL SPINE WITHOUT CONTRAST TECHNIQUE: Multidetector CT imaging of the head and cervical spine was performed following the standard protocol without intravenous contrast. Multiplanar CT image reconstructions of the cervical spine were also generated. COMPARISON:  Cervical spine MRI 07/31/2017 FINDINGS: CT HEAD FINDINGS Brain: Mild cerebral atrophy. No evidence for acute hemorrhage, mass lesion, midline shift, hydrocephalus or large infarct. Subtle low-density in the periventricular white matter. Vascular: No hyperdense vessel or unexpected calcification. Skull: Normal. Negative for fracture or focal lesion. Sinuses/Orbits: Small amount of focal disease in the left sphenoid sinus. Other: None. CT CERVICAL SPINE FINDINGS Alignment: Again noted is minimal anterolisthesis at C4-C5. Skull base and vertebrae: No acute fracture. No primary bone lesion or focal pathologic process. Soft tissues and spinal canal: Scattered calcifications throughout the bilateral  parotid glands, right side greater than left. No paravertebral soft tissue abnormality. Thyroid tissue is mildly prominent. Disc levels: Severe multilevel degenerative facet disease. Ankylosis of the right C2-C3 facets. Degenerative changes along the right side of the occiput and C1. Prominent central disc bulge or disc osteophyte complex at C5-C6. Upper chest: Negative. Other: None IMPRESSION: No acute intracranial abnormality. 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 Electronically Signed   By: Markus Daft M.D.   On: 11/07/2017 15:52   Ct Cervical Spine Wo Contrast  Result Date: 11/07/2017 CLINICAL DATA:  77 year old with right arm weakness and numbness. Stroke suspected. EXAM: CT HEAD WITHOUT CONTRAST CT CERVICAL SPINE WITHOUT CONTRAST TECHNIQUE: Multidetector CT imaging of the head and cervical spine was performed following the standard protocol without intravenous contrast. Multiplanar CT image reconstructions of the cervical spine were also generated. COMPARISON:  Cervical spine MRI 07/31/2017 FINDINGS: CT HEAD FINDINGS Brain: Mild cerebral atrophy. No evidence for acute hemorrhage, mass lesion, midline shift, hydrocephalus or large infarct. Subtle low-density in the periventricular white matter. Vascular: No hyperdense vessel or unexpected calcification. Skull: Normal. Negative for fracture or focal lesion. Sinuses/Orbits: Small amount of focal disease in the left sphenoid sinus. Other: None. CT CERVICAL SPINE FINDINGS Alignment: Again noted is minimal anterolisthesis at C4-C5. Skull base and vertebrae: No acute fracture. No primary bone lesion or focal pathologic process. Soft tissues and spinal canal: Scattered calcifications throughout the bilateral parotid glands, right side greater than left. No paravertebral soft tissue abnormality. Thyroid tissue is mildly prominent. Disc levels: Severe multilevel degenerative facet disease. Ankylosis of the right C2-C3  facets. Degenerative changes along the right side of the occiput and C1. Prominent central disc bulge or disc osteophyte complex at C5-C6. Upper chest: Negative. Other: None IMPRESSION: No acute intracranial abnormality. 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 Electronically Signed   By: Markus Daft M.D.   On: 11/07/2017 15:52    EKG: Independently reviewed. Sinus rhythm, PVC's, RBBB, LVH with repolarization abnormality.   Assessment/Plan  1. Right arm weakness  - Presents with severe pain in right shoulder and arm, as well as RUE weakness  - Head CT negative for acute findings  - Strength assessment is complicated by pain  - There was possible RLE weakness also noted in ED concerning for CVA - Neurology consulting and much appreciated  - MRI brain ordered  - Continue cardiac monitoring, frequent neuro checks, swallow screen, PT/OT consultation, follow-up neuro  recs, and continue ASA and statin   2. PMR; SLE  - Follows with rheumatology, managed with Actemra and low-dose prednisone  - Has been experiencing increased shoulder pain, particularly on right, will check ESR, continue low-dose prednisone    3. Elevated troponin  - Troponin slightly elevated in ED with no anginal complaints  - Continue cardiac monitoring, trend troponin, continue ASA and statin    4. Insulin-dependent DM  - A1c was 76.3% this month  - Managed at home with metformin and Tresiba 36 units qAM  - Check CBG's, use Lantus 20 units qAM with Novolog correctional to start while in hospital, adjust as needed    5. Hypertension  - BP at goal  - Hold antihypertensives initially while evaluating for possible acute ischemic CVA    6. Hypokalemia  - Serum potassium is 3.2 on admission  - Given 20 mEq of oral potassium  - Repeat chem panel in am    7. Asthma, mild-persistent  - No SOB or wheezing  - Continue Singulair, ICS/LABA, and prn albuterol   8. UTI  - She was  started on cefuroxime 11/05/17 for dysuria and UA compatible with infection  - Symptoms have improved, no fever or leukocytosis  - Continue antibiotics, follow culture    DVT prophylaxis: Lovenox  Code Status: Full  Family Communication: Discussed with patient  Consults called: Neurology  Admission status: Observation    Vianne Bulls, MD [facility] Pager [phone]  If 7PM-7AM, please contact night-coverage [email or web address] Password [facility]  11/07/2017, 10:46 PM

## 2017-11-07 NOTE — ED Provider Notes (Signed)
Emergency Department Provider Note   I have reviewed the triage vital signs and the nursing notes.   HISTORY  Chief Complaint Extremity Weakness   HPI Kerry DOWLING is a 77 y.o. female with PMH of asthma, DM, HTN, Lupus, osteoporosis, and Raynaud's presents to the emergency department for evaluation of right arm and leg weakness after waking at 10 AM this morning.  Patient was last normal night prior.  She has baseline pain and some weakness in the left shoulder/arm but not in the right.  He does report pain in the right shoulder but states that her whole arm feels weak and heavy despite the pain.  She is also endorsing some weakness in the right leg.  No vision or speech disturbance.  No difficulty swallowing. No head injury. No radiation of symptoms or modifying factors.    Past Medical History:  Diagnosis Date  . Asthma   . Diabetes mellitus without complication (HCC)   . Hypertension   . Lupus (systemic lupus erythematosus) (HCC)   . Neuropathy   . Raynaud's disease   . Vertebral fracture, osteoporotic [facility])     Patient Active Problem List   Diagnosis Date Noted  . CVA (cerebral vascular accident) (HCC) 11/07/2017  . Neck pain 05/09/2016  . Urinary incontinence 01/02/2016  . Acquired hallux valgus 08/22/2015  . Hammer toe 08/22/2015  . Burning with urination 06/20/2015  . Other long term (current) drug therapy 06/20/2015  . Gonalgia 06/19/2015  . Allergic rhinitis 02/15/2015  . Cervical osteoarthritis 02/15/2015  . Acid reflux 02/15/2015  . BP (high blood pressure) 02/15/2015  . LBP (low back pain) 02/15/2015  . Microalbuminuria 02/15/2015  . Diabetic retinopathy associated with type 2 diabetes mellitus (HCC) 02/15/2015  . Asthma, mild persistent 02/15/2015  . 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 B12 deficiency 02/15/2015  . Diabetes mellitus (HCC) 12/16/2014  . Bilateral low back pain with bilateral sciatica 12/16/2014  . Diabetes (HCC) 05/17/2014    . Lower back pain 05/17/2014  . Sciatica 05/17/2014  . Lumbar vertebral fracture (HCC) 05/17/2014  . Pain in joint, shoulder region 05/17/2014  . Insomnia 05/17/2014  . Fracture of lumbar vertebra (HCC) 05/17/2014  . Airway hyperreactivity 08/01/2012  . Bunion 08/01/2012  . Cataract 08/01/2012  . Diaphragmatic hernia 08/01/2012  . D (diarrhea) 08/01/2012  . Essential (primary) hypertension 08/01/2012  . Glaucoma 08/01/2012  . HLD (hyperlipidemia) 08/01/2012  . 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 N&V (nausea and vomiting) 08/01/2012  . Arthralgia of lower leg 08/01/2012  . Arthralgia of hip or thigh 08/01/2012  . Paroxysmal digital cyanosis 08/01/2012  . Diabetes mellitus, type 2 (HCC) 08/01/2012  . FOM (frequency of micturition) 08/01/2012  . Emesis 08/01/2012  . Systemic lupus erythematosus (HCC) 08/01/2012  . Type 2 diabetes mellitus (HCC) 08/01/2012    Past Surgical History:  Procedure Laterality Date  . TONSILLECTOMY      Allergies Canagliflozin and Propoxyphene  No family history on file.  Social History Social History   Tobacco Use  . Smoking status: Never Smoker  . Smokeless tobacco: Never Used  Substance Use Topics  . Alcohol use: No    Alcohol/week: 0.0 standard drinks  . Drug use: No    Review of Systems  Constitutional: No fever/chills Eyes: No visual changes. ENT: No sore throat. Cardiovascular: Denies chest pain. Respiratory: Denies shortness of breath. Gastrointestinal: No abdominal pain.  No nausea, no vomiting.  No diarrhea.  No constipation. Genitourinary: Negative for dysuria. Musculoskeletal: Negative  for back pain. Positive bilateral shoulder pain.  Skin: Negative for rash. Neurological: Negative for headaches or numbness. Positive right arm/leg weakness.   10-point ROS otherwise negative.  ____________________________________________   PHYSICAL EXAM:  VITAL SIGNS: ED Triage Vitals  Enc Vitals Group     BP  11/07/17 1453 (!) 169/90     Pulse Rate 11/07/17 1453 94     Resp 11/07/17 1453 18     Temp 11/07/17 1453 98.3 F (36.8 C)     Temp Source 11/07/17 1453 Oral     SpO2 11/07/17 1453 99 %     Weight 11/07/17 1456 202 lb (91.6 kg)     Height 11/07/17 1456 5\' 3"  (1.6 m)     Pain Score 11/07/17 1455 10   Constitutional: Alert and oriented. Well appearing and in no acute distress. Eyes: Conjunctivae are normal. PERRL. EOMI. Head: Atraumatic. Nose: No congestion/rhinnorhea. Mouth/Throat: Mucous membranes are moist.  Neck: No stridor. No cervical spine tenderness to palpation. Cardiovascular: Normal rate, regular rhythm. Good peripheral circulation. Grossly normal heart sounds.   Respiratory: Normal respiratory effort.  No retractions. Lungs CTAB. Gastrointestinal: Soft and nontender. No distention.  Musculoskeletal: Decreased ROM of the right shoulder 2/2 pain. No elbow or wrist tenderness. Normal ROM of the hips, knees, and ankles bilaterally.  Neurologic:  Normal speech and language. Normal CN exam 2-12. 3/5 strength in the RUE with decreased grip strength. 4+/5 strength in the RLE with preserved sensation throughout. RUE cannot resist gravity with attempt to assess pronator drift.  Skin:  Skin is warm, dry and intact. No rash noted.  ____________________________________________   LABS (all labs ordered are listed, but only abnormal results are displayed)  Labs Reviewed  CBC - Abnormal; Notable for the following components:      Result Value   Platelets 122 (*)    All other components within normal limits  COMPREHENSIVE METABOLIC PANEL - Abnormal; Notable for the following components:   Potassium 3.2 (*)    Glucose, Bld 171 (*)    Calcium 8.7 (*)    All other components within normal limits  TROPONIN I - Abnormal; Notable for the following components:   Troponin I 0.05 (*)    All other components within normal limits  RAPID URINE DRUG SCREEN, HOSP PERFORMED - Abnormal; Notable  for the following components:   Opiates POSITIVE (*)    All other components within normal limits  URINALYSIS, ROUTINE W REFLEX MICROSCOPIC - Abnormal; Notable for the following components:   Glucose, UA 100 (*)    Leukocytes, UA TRACE (*)    All other components within normal limits  URINALYSIS, MICROSCOPIC (REFLEX) - Abnormal; Notable for the following components:   Bacteria, UA FEW (*)    All other components within normal limits  CBG MONITORING, ED - Abnormal; Notable for the following components:   Glucose-Capillary 164 (*)    All other components within normal limits  ETHANOL  PROTIME-INR  APTT  DIFFERENTIAL   ____________________________________________  EKG   EKG Interpretation  Date/Time:  Friday November 07 2017 15:16:45 EDT Ventricular Rate:  95 PR Interval:    QRS Duration: 126 QT Interval:  369 QTC Calculation: 464 R Axis:   -54 Text Interpretation:  Sinus rhythm Multiple premature complexes, vent & supraven Right bundle branch block LVH with IVCD and secondary repol abnrm Baseline wander in lead(s) III aVL aVF No STEMI. No prior for comparison Confirmed by Alona Bene [phone]) on 11/07/2017 3:28:57 PM Also confirmed by Alona Bene (  [id number]), editor Lodema Hong, Tamera Punt ([id number])  on 11/07/2017 3:32:51 PM       ____________________________________________  RADIOLOGY  Dg Shoulder Right  Result Date: 11/07/2017 CLINICAL DATA:  Acute onset of right shoulder pain this morning. No known injury. EXAM: RIGHT SHOULDER - 2+ VIEW COMPARISON:  None. FINDINGS: Moderate glenohumeral joint degenerative changes with osteophytic spurring. There are also mild to moderate AC joint degenerative changes. Widened humeroacromial space and slight inferior subluxation of the humeral head could possibly be due to a joint effusion. Bony density near the greater tuberosity likely frayed fleck ting calcific tendinitis. The visualized right ribs are intact.  No worrisome lung lesions. IMPRESSION:  Moderate glenohumeral joint degenerative changes. Calcific tendinitis involving the rotator cuff tendons distally near the attachment site on the greater tuberosity. Widened humeroacromial space and slight inferior subluxation of the humeral head could be due to a joint effusion. Electronically Signed   By: Rudie Meyer M.D.   On: 11/07/2017 16:51   Ct Head Wo Contrast  Result Date: 11/07/2017 CLINICAL DATA:  77 year old with right arm weakness and numbness. Stroke suspected. EXAM: CT HEAD WITHOUT CONTRAST CT CERVICAL SPINE WITHOUT CONTRAST TECHNIQUE: Multidetector CT imaging of the head and cervical spine was performed following the standard protocol without intravenous contrast. Multiplanar CT image reconstructions of the cervical spine were also generated. COMPARISON:  Cervical spine MRI 07/31/2017 FINDINGS: CT HEAD FINDINGS Brain: Mild cerebral atrophy. No evidence for acute hemorrhage, mass lesion, midline shift, hydrocephalus or large infarct. Subtle low-density in the periventricular white matter. Vascular: No hyperdense vessel or unexpected calcification. Skull: Normal. Negative for fracture or focal lesion. Sinuses/Orbits: Small amount of focal disease in the left sphenoid sinus. Other: None. CT CERVICAL SPINE FINDINGS Alignment: Again noted is minimal anterolisthesis at C4-C5. Skull base and vertebrae: No acute fracture. No primary bone lesion or focal pathologic process. Soft tissues and spinal canal: Scattered calcifications throughout the bilateral parotid glands, right side greater than left. No paravertebral soft tissue abnormality. Thyroid tissue is mildly prominent. Disc levels: Severe multilevel degenerative facet disease. Ankylosis of the right C2-C3 facets. Degenerative changes along the right side of the occiput and C1. Prominent central disc bulge or disc osteophyte complex at C5-C6. Upper chest: Negative. Other: None IMPRESSION: No acute intracranial abnormality. Multilevel degenerative  disease in the cervical spine without acute bone abnormality. Electronically Signed   By: Richarda Overlie M.D.   On: 11/07/2017 15:52   Ct Cervical Spine Wo Contrast  Result Date: 11/07/2017 CLINICAL DATA:  77 year old with right arm weakness and numbness. Stroke suspected. EXAM: CT HEAD WITHOUT CONTRAST CT CERVICAL SPINE WITHOUT CONTRAST TECHNIQUE: Multidetector CT imaging of the head and cervical spine was performed following the standard protocol without intravenous contrast. Multiplanar CT image reconstructions of the cervical spine were also generated. COMPARISON:  Cervical spine MRI 07/31/2017 FINDINGS: CT HEAD FINDINGS Brain: Mild cerebral atrophy. No evidence for acute hemorrhage, mass lesion, midline shift, hydrocephalus or large infarct. Subtle low-density in the periventricular white matter. Vascular: No hyperdense vessel or unexpected calcification. Skull: Normal. Negative for fracture or focal lesion. Sinuses/Orbits: Small amount of focal disease in the left sphenoid sinus. Other: None. CT CERVICAL SPINE FINDINGS Alignment: Again noted is minimal anterolisthesis at C4-C5. Skull base and vertebrae: No acute fracture. No primary bone lesion or focal pathologic process. Soft tissues and spinal canal: Scattered calcifications throughout the bilateral parotid glands, right side greater than left. No paravertebral soft tissue abnormality. Thyroid tissue is mildly prominent. Disc levels: Severe multilevel degenerative facet disease.  Ankylosis of the right C2-C3 facets. Degenerative changes along the right side of the occiput and C1. Prominent central disc bulge or disc osteophyte complex at C5-C6. Upper chest: Negative. Other: None IMPRESSION: No acute intracranial abnormality. 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 Electronically Signed   By: Richarda OverlieAdam  Henn M.D.   On: 11/07/2017 15:52    ____________________________________________   PROCEDURES  Procedure(s)  performed:   Procedures  None ____________________________________________   INITIAL IMPRESSION / ASSESSMENT AND PLAN / ED COURSE  Pertinent labs & imaging results that were available during my care of the patient were reviewed by me and considered in my medical decision making (see chart for details).  Patient presents to the emergency department for evaluation of right-sided weakness in both the arm and the leg.  The right upper extremity is far more affected than the right lower extremity.  No apparent cranial nerve deficits.  Patient is outside of tPA window.  The patient's deficits in the right upper extremity have certain components of the history which sound musculoskeletal but patient has decreased grip strength and fairly pronounced weakness.  She does have pain with passive range of motion of the right shoulder but will tolerate this for the exam.  Her right lower extremity also has some associated weakness.  Plan for CT imaging of the head and cervical spine along with plain film of the right shoulder. Plan on CVA evaluation and Neurology consultation.   04:15 PM With slightly elevated troponin.  No chest pain or other anginal equivalents.  CT imaging of the head and cervical spine reviewed with no acute findings.  Spoke with Dr. Jerrell BelfastAurora regarding the case. [facility] and [facility]A w/u. Neurology requests that the primary team call when the patient arrives so that they can evaluate.   Discussed patient's case with Hospitalist to request admission. Patient and family (if present) updated with plan. Care transferred to Hospitalist service.  I reviewed all nursing notes, vitals, pertinent old records, EKGs, labs, imaging (as available).  ___________________________________________  FINAL CLINICAL IMPRESSION(S) / ED DIAGNOSES  Final diagnoses:  Stroke-like symptoms  Acute pain of right shoulder    MEDICATIONS GIVEN DURING THIS VISIT:  Medications    morphine 2 MG/ML injection 2 mg (has no administration in time range)    Note:  This document was preared using Dragon voice recognition software and may include unintentional dictation errors.  Alona BeneJoshua Long, MD Emergency Medicine    Long, Arlyss RepressJoshua G, MD 11/07/17 [phone]

## 2017-11-08 DIAGNOSIS — R531 Weakness: Secondary | ICD-10-CM

## 2017-11-08 DIAGNOSIS — R748 Abnormal levels of other serum enzymes: Secondary | ICD-10-CM | POA: Diagnosis not present

## 2017-11-08 DIAGNOSIS — E119 Type 2 diabetes mellitus without complications: Secondary | ICD-10-CM

## 2017-11-08 DIAGNOSIS — E876 Hypokalemia: Secondary | ICD-10-CM

## 2017-11-08 DIAGNOSIS — M25511 Pain in right shoulder: Secondary | ICD-10-CM | POA: Diagnosis not present

## 2017-11-08 DIAGNOSIS — M329 Systemic lupus erythematosus, unspecified: Secondary | ICD-10-CM | POA: Diagnosis not present

## 2017-11-08 DIAGNOSIS — M353 Polymyalgia rheumatica: Secondary | ICD-10-CM | POA: Diagnosis not present

## 2017-11-08 LAB — GLUCOSE, CAPILLARY
GLUCOSE-CAPILLARY: 139 mg/dL — AB (ref 70–99)
GLUCOSE-CAPILLARY: 206 mg/dL — AB (ref 70–99)
GLUCOSE-CAPILLARY: 220 mg/dL — AB (ref 70–99)
GLUCOSE-CAPILLARY: 232 mg/dL — AB (ref 70–99)
Glucose-Capillary: 207 mg/dL — ABNORMAL HIGH (ref 70–99)

## 2017-11-08 LAB — LIPID PANEL
CHOL/HDL RATIO: 2.6 ratio
Cholesterol: 158 mg/dL (ref 0–200)
HDL: 61 mg/dL (ref >40)
LDL Cholesterol: 82 mg/dL (ref 0–99)
Triglycerides: 75 mg/dL (ref <150)
VLDL: 15 mg/dL (ref 0–40)

## 2017-11-08 LAB — HEMOGLOBIN A1C
Hgb A1c MFr Bld: 7.8 % — ABNORMAL HIGH (ref 4.8–5.6)
Mean Plasma Glucose: 177.16 mg/dL

## 2017-11-08 LAB — URIC ACID: URIC ACID, SERUM: 5.3 mg/dL (ref 2.5–7.1)

## 2017-11-08 LAB — CK: CK TOTAL: 52 U/L (ref 38–234)

## 2017-11-08 LAB — MAGNESIUM: MAGNESIUM: 1.7 mg/dL (ref 1.7–2.4)

## 2017-11-08 LAB — SEDIMENTATION RATE: SED RATE: 12 mm/h (ref 0–22)

## 2017-11-08 MED ORDER — DICLOFENAC SODIUM 1 % TD GEL
2.0000 g | Freq: Four times a day (QID) | TRANSDERMAL | Status: DC
  Administered 2017-11-08 – 2017-11-12 (×16): 2 g via TOPICAL
  Filled 2017-11-08: qty 100

## 2017-11-08 MED ORDER — MAGNESIUM HYDROXIDE 400 MG/5ML PO SUSP
5.0000 mL | Freq: Every day | ORAL | Status: DC | PRN
  Administered 2017-11-09 – 2017-11-10 (×2): 5 mL via ORAL
  Filled 2017-11-08 (×3): qty 30

## 2017-11-08 MED ORDER — PREDNISONE 5 MG PO TABS
10.0000 mg | ORAL_TABLET | Freq: Every day | ORAL | Status: DC
  Administered 2017-11-08: 10 mg via ORAL

## 2017-11-08 NOTE — Progress Notes (Signed)
Progress Note    Kerry Brooks  OBS:[id number] DOB: 02/18/1941  DOA: 11/07/2017 PCP: Reita Cliche, MD    Brief Narrative:     Medical records reviewed and are as summarized below:  Kerry Brooks is an 77 y.o. female with medical history significant for insulin-dependent diabetes mellitus, asthma, hypertension, polymyalgia rheumatica, lupus, and recent urinary symptoms started on antibiotics for UTI 2 days ago, now presenting to the emergency department with pain and weakness involving the right arm.  Patient reports that she has experienced a lot of shoulder pain, usually more so on the left, and has received intra-articular injections for this.  She had been experiencing some urinary symptoms and nausea last night, but woke this morning with severe pain in the right shoulder and arm, as well as right arm weakness.   Assessment/Plan:   Principal Problem:   Acute right-sided weakness Active Problems:   Insulin-requiring or dependent type II diabetes mellitus ([place])   Essential (primary) hypertension   Acute pain of right shoulder   Systemic lupus erythematosus (HCC)   Asthma, mild persistent   Polymyalgia rheumatica (HCC)   Suspected stroke patient last known to be well more than 2 hours ago   Hypokalemia   Elevated troponin   Right arm weakness  - Presents with severe pain in right shoulder and arm, as well as RUE weakness  - Head CT negative for acute findings  - Neurology consulting and much appreciated  - MRI brain negative for acute issues - PT/OT  PMR; SLE  - Follows with rheumatology, managed with Actemra and low-dose prednisone  - Has been experiencing increased shoulder pain, particularly on right -ESR not elevated but will increase prednisone for now -x ray shows possible effusion, may need ortho evaluation inpt vs outpatient or further imaging -PT/OT eval  Elevated troponin  - Troponins flat -no chest pain  Insulin-dependent DM  - A1c was  7.8% this month  - Managed at home with metformin and Tresiba 36 units qAM  -  use Lantus 20 units qAM with Novolog correctional to start while in hospital, adjust as needed  -SSI   Hypertension  - BP at goal   Hypokalemia  - Serum potassium is 3.2 on admission  - repleted -check Mg -recheck BMP in AM  Asthma, mild-persistent  - No SOB or wheezing  - Continue Singulair, ICS/LABA, and prn albuterol   UTI  - She was started on cefuroxime 11/05/17 for dysuria and UA compatible with infection  - Symptoms have improved, no fever or leukocytosis  - Continue antibiotics, follow culture   obesity Body mass index is 35.78 kg/m.   Family Communication/Anticipated D/C date and plan/Code Status   DVT prophylaxis: Lovenox ordered. Code Status: Full Code.  Family Communication: none Disposition Plan: pending improvement.PT/OT   Medical Consultants:    Neuro     Subjective:   Just decreased to 5 mg from 55m of prednisone as few weeks ago  Objective:    Vitals:   11/08/17 0414 11/08/17 0515 11/08/17 0745 11/08/17 0857  BP: 133/79 120/71 118/78   Pulse: 88 90 (!) 101   Resp: 18 18 18    Temp: 98.4 F (36.9 C) 98.5 F (36.9 C) 97.8 F (36.6 C)   TempSrc: Oral Oral Oral   SpO2: 98% 99% 98% 98%  Weight:      Height:        Intake/Output Summary (Last 24 hours) at 11/08/2017 1018 Last data filed at 11/08/2017 0400  Gross per 24 hour  Intake 1070 ml  Output -  Net 1070 ml   Filed Weights   11/07/17 1456 11/07/17 2115  Weight: 91.6 kg 91.6 kg    Exam: In bed, NAD rrr Clear, no increased work of breathing +BS, soft A+Ox3  Data Reviewed:   I have personally reviewed following labs and imaging studies:  Labs: Labs show the following:   Basic Metabolic Panel: Recent Labs  Lab 11/07/17 1519  NA 137  K 3.2*  CL 99  CO2 25  GLUCOSE 171*  BUN 13  CREATININE 0.57  CALCIUM 8.7*   GFR Estimated Creatinine Clearance: 63.3 mL/min (by C-G formula  based on SCr of 0.57 mg/dL). Liver Function Tests: Recent Labs  Lab 11/07/17 1519  AST 28  ALT 18  ALKPHOS 66  BILITOT 0.6  PROT 7.0  ALBUMIN 4.0   No results for input(s): LIPASE, AMYLASE in the last 168 hours. No results for input(s): AMMONIA in the last 168 hours. Coagulation profile Recent Labs  Lab 11/07/17 1519  INR 1.02    CBC: Recent Labs  Lab 11/07/17 1519  WBC 6.3  NEUTROABS 4.2  HGB 12.2  HCT 37.1  MCV 91.4  PLT 122*   Cardiac Enzymes: Recent Labs  Lab 11/07/17 1519 11/07/17 1835 11/07/17 2253 11/08/17 0736  CKTOTAL  --   --   --  52  TROPONINI 0.05* 0.06* 0.04*  --    BNP (last 3 results) No results for input(s): PROBNP in the last 8760 hours. CBG: Recent Labs  Lab 11/07/17 1514 11/08/17 0059 11/08/17 0700  GLUCAP 164* 220* 139*   D-Dimer: No results for input(s): DDIMER in the last 72 hours. Hgb A1c: Recent Labs    11/08/17 0736  HGBA1C 7.8*   Lipid Profile: Recent Labs    11/08/17 0736  CHOL 158  HDL 61  LDLCALC 82  TRIG 75  CHOLHDL 2.6   Thyroid function studies: No results for input(s): TSH, T4TOTAL, T3FREE, THYROIDAB in the last 72 hours.  Invalid input(s): FREET3 Anemia work up: No results for input(s): VITAMINB12, FOLATE, FERRITIN, TIBC, IRON, RETICCTPCT in the last 72 hours. Sepsis Labs: Recent Labs  Lab 11/07/17 1519  WBC 6.3    Microbiology No results found for this or any previous visit (from the past 240 hour(s)).  Procedures and diagnostic studies:  Dg Shoulder Right  Result Date: 11/07/2017 CLINICAL DATA:  Acute onset of right shoulder pain this morning. No known injury. EXAM: RIGHT SHOULDER - 2+ VIEW COMPARISON:  None. FINDINGS: Moderate glenohumeral joint degenerative changes with osteophytic spurring. There are also mild to moderate AC joint degenerative changes. Widened humeroacromial space and slight inferior subluxation of the humeral head could possibly be due to a joint effusion. Bony density  near the greater tuberosity likely frayed fleck ting calcific tendinitis. The visualized right ribs are intact.  No worrisome lung lesions. IMPRESSION: Moderate glenohumeral joint degenerative changes. Calcific tendinitis involving the rotator cuff tendons distally near the attachment site on the greater tuberosity. Widened humeroacromial space and slight inferior subluxation of the humeral head could be due to a joint effusion. Electronically Signed   By: Marijo Sanes M.D.   On: 11/07/2017 16:51   Ct Head Wo Contrast  Result Date: 11/07/2017 CLINICAL DATA:  77 year old with right arm weakness and numbness. Stroke suspected. EXAM: CT HEAD WITHOUT CONTRAST CT CERVICAL SPINE WITHOUT CONTRAST TECHNIQUE: Multidetector CT imaging of the head and cervical spine was performed following the standard protocol without intravenous  contrast. Multiplanar CT image reconstructions of the cervical spine were also generated. COMPARISON:  Cervical spine MRI 07/31/2017 FINDINGS: CT HEAD FINDINGS Brain: Mild cerebral atrophy. No evidence for acute hemorrhage, mass lesion, midline shift, hydrocephalus or large infarct. Subtle low-density in the periventricular white matter. Vascular: No hyperdense vessel or unexpected calcification. Skull: Normal. Negative for fracture or focal lesion. Sinuses/Orbits: Small amount of focal disease in the left sphenoid sinus. Other: None. CT CERVICAL SPINE FINDINGS Alignment: Again noted is minimal anterolisthesis at C4-C5. Skull base and vertebrae: No acute fracture. No primary bone lesion or focal pathologic process. Soft tissues and spinal canal: Scattered calcifications throughout the bilateral parotid glands, right side greater than left. No paravertebral soft tissue abnormality. Thyroid tissue is mildly prominent. Disc levels: Severe multilevel degenerative facet disease. Ankylosis of the right C2-C3 facets. Degenerative changes along the right side of the occiput and C1. Prominent central  disc bulge or disc osteophyte complex at C5-C6. Upper chest: Negative. Other: None IMPRESSION: No acute intracranial abnormality. 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 Electronically Signed   By: Markus Daft M.D.   On: 11/07/2017 15:52   Ct Cervical Spine Wo Contrast  Result Date: 11/07/2017 CLINICAL DATA:  77 year old with right arm weakness and numbness. Stroke suspected. EXAM: CT HEAD WITHOUT CONTRAST CT CERVICAL SPINE WITHOUT CONTRAST TECHNIQUE: Multidetector CT imaging of the head and cervical spine was performed following the standard protocol without intravenous contrast. Multiplanar CT image reconstructions of the cervical spine were also generated. COMPARISON:  Cervical spine MRI 07/31/2017 FINDINGS: CT HEAD FINDINGS Brain: Mild cerebral atrophy. No evidence for acute hemorrhage, mass lesion, midline shift, hydrocephalus or large infarct. Subtle low-density in the periventricular white matter. Vascular: No hyperdense vessel or unexpected calcification. Skull: Normal. Negative for fracture or focal lesion. Sinuses/Orbits: Small amount of focal disease in the left sphenoid sinus. Other: None. CT CERVICAL SPINE FINDINGS Alignment: Again noted is minimal anterolisthesis at C4-C5. Skull base and vertebrae: No acute fracture. No primary bone lesion or focal pathologic process. Soft tissues and spinal canal: Scattered calcifications throughout the bilateral parotid glands, right side greater than left. No paravertebral soft tissue abnormality. Thyroid tissue is mildly prominent. Disc levels: Severe multilevel degenerative facet disease. Ankylosis of the right C2-C3 facets. Degenerative changes along the right side of the occiput and C1. Prominent central disc bulge or disc osteophyte complex at C5-C6. Upper chest: Negative. Other: None IMPRESSION: No acute intracranial abnormality. 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 Electronically Signed   By: Markus Daft M.D.   On: 11/07/2017 15:52   Mr Brain Wo Contrast  Result Date: 11/08/2017 CLINICAL DATA:  Follow-up suspected stroke. EXAM: MRI HEAD WITHOUT CONTRAST TECHNIQUE: Multiplanar, multiecho pulse sequences of the brain and surrounding structures were obtained without intravenous contrast. COMPARISON:  CT HEAD November 07, 2017 FINDINGS: INTRACRANIAL CONTENTS: No reduced diffusion to suggest acute ischemia. No susceptibility artifact to suggest hemorrhage. The ventricles and sulci are normal for patient's age. Patchy supratentorial and pontine white matter FLAIR T2 hyperintensities. No suspicious parenchymal signal, masses, mass effect. No abnormal extra-axial fluid collections. Mildly prominent RIGHT posterior fossa and RIGHT greater than LEFT frontal extra-axial spaces. No extra-axial masses. VASCULAR: Normal major intracranial vascular flow voids present at skull base. SKULL AND UPPER CERVICAL SPINE: No abnormal sellar expansion. No suspicious calvarial bone marrow signal. Craniocervical junction maintained. SINUSES/ORBITS: Mild paranasal sinus mucosal thickening without air-fluid levels. Maxillary air cells are well aerated. Included ocular globes  and orbital contents are non-suspicious. Status post RIGHT ocular lens implant. OTHER: None. IMPRESSION: 1. No acute intracranial process. 2. Mild-to-moderate chronic small vessel ischemic changes. Electronically Signed   By: Elon Alas M.D.   On: 11/08/2017 00:41    Medications:   . aspirin EC  81 mg Oral Daily  . brinzolamide  1 drop Both Eyes TID  . diclofenac sodium  2 g Topical TID  . doxepin  10 mg Oral QHS  . enoxaparin (LOVENOX) injection  40 mg Subcutaneous Daily  . gabapentin  300 mg Oral BID WC   And  . gabapentin  600 mg Oral QHS  . insulin aspart  0-5 Units Subcutaneous QHS  . insulin aspart  0-9 Units Subcutaneous TID WC  . insulin glargine  20 Units Subcutaneous Daily  .  mometasone-formoterol  2 puff Inhalation BID  . montelukast  10 mg Oral QHS  . pantoprazole  40 mg Oral Daily  . pravastatin  40 mg Oral q1800  . predniSONE  10 mg Oral Q breakfast   Continuous Infusions: . cefTRIAXone (ROCEPHIN)  IV 1 g (11/08/17 0050)     LOS: 0 days   Geradine Girt  [facility]   *Please refer to [email or web address], password [email or web address] to get updated schedule on who will round on this patient, as hospitalists switch teams weekly. If 7PM-7AM, please contact night-coverage at [email or web address], password [email or web address] for any overnight needs.  11/08/2017, 10:18 AM

## 2017-11-08 NOTE — Evaluation (Signed)
Physical Therapy Evaluation Patient Details Name: Kerry Brooks MRN: [id number] DOB: 10-19-1940 Today's Date: 11/08/2017   History of Present Illness  Patient is a 77 y/o female presenting with R UE pain and weakness. Radiographs of the right shoulder demonstrate degenerative changes of the right glenohumeral joint, calcific tendinitis involving the rotator cuff tendons, and widened humeral acromial space with slight inferior subluxation that could possibly be due to an effusion.  Noncontrast head CT is negative for acute intracranial abnormality and no acute osseous abnormalities were noted on the cervical spine CT. In the ED reports pain and weakness "all over". PMH significant for polymyalgia rheumatica and systemic lupus erythymatosus followed by Rheumatology and managed with Actemra and low-dose prednisone.    Clinical Impression  Kerry Brooks is a very pleasant 77 y/o female admitted with the above listed diagnosis. Patient reports that prior to admission she was Mod I with rollator vs SPC in the home and community. Patient today requiring general Mod A +2 for mobility. Requires cueing for safety and motivation to complete tasks. Due to reduced safe and independent functional mobility, PT to recommend SNF at discharge. PT to follow acutely.     Follow Up Recommendations SNF    Equipment Recommendations  None recommended by PT    Recommendations for Other Services       Precautions / Restrictions Precautions Precautions: Fall Restrictions Weight Bearing Restrictions: No      Mobility  Bed Mobility Overal bed mobility: Needs Assistance Bed Mobility: Supine to Sit     Supine to sit: Mod assist;+2 for physical assistance     General bed mobility comments: use of bed pad for hip control; does not attempt to use UE  Transfers Overall transfer level: Needs assistance Equipment used: Rolling walker (2 wheeled) Transfers: Sit to/from Stand Sit to Stand: Mod assist;+2 physical  assistance         General transfer comment: Mod A +2 to power up at bedside; Verbal cueing to stand erect  Ambulation/Gait Ambulation/Gait assistance: Min assist Gait Distance (Feet): 15 Feet Assistive device: Rolling walker (2 wheeled) Gait Pattern/deviations: Step-to pattern;Decreased stride length;Shuffle;Antalgic;Trunk flexed;Narrow base of support Gait velocity: decreased   General Gait Details: slow pace- required cues for motivation to perform  Stairs            Wheelchair Mobility    Modified Rankin (Stroke Patients Only)       Balance Overall balance assessment: Mild deficits observed, not formally tested                                           Pertinent Vitals/Pain Pain Assessment: 0-10 Pain Score: 8  Pain Location: "all over" Pain Descriptors / Indicators: Aching;Discomfort;Grimacing;Moaning Pain Intervention(s): Limited activity within patient's tolerance;Monitored during session;Repositioned    Home Living Family/patient expects to be discharged to:: Private residence Living Arrangements: Alone Available Help at Discharge: Family;Available PRN/intermittently Type of Home: House Home Access: Stairs to enter Entrance Stairs-Rails: Can reach both Entrance Stairs-Number of Steps: 3 Home Layout: Multi-level;Able to live on main level with bedroom/bathroom Home Equipment: Gilmer MorCane - single point;Walker - 4 wheels;Tub bench      Prior Function Level of Independence: Independent with assistive device(s)         Comments: has someone that does her driving, and household chores     Hand Dominance        Extremity/Trunk  Assessment   Upper Extremity Assessment Upper Extremity Assessment: Defer to OT evaluation    Lower Extremity Assessment Lower Extremity Assessment: Generalized weakness    Cervical / Trunk Assessment Cervical / Trunk Assessment: Kyphotic  Communication   Communication: No difficulties  Cognition  Arousal/Alertness: Awake/alert Behavior During Therapy: WFL for tasks assessed/performed Overall Cognitive Status: Within Functional Limits for tasks assessed                                        General Comments      Exercises     Assessment/Plan    PT Assessment Patient needs continued PT services  PT Problem List Decreased strength;Decreased activity tolerance;Decreased balance;Decreased mobility;Decreased safety awareness;Decreased knowledge of use of DME       PT Treatment Interventions DME instruction;Gait training;Stair training;Functional mobility training;Therapeutic activities;Therapeutic exercise;Balance training;Patient/family education    PT Goals (Current goals can be found in the Care Plan section)  Acute Rehab PT Goals Patient Stated Goal: return home, reduce pain PT Goal Formulation: With patient Time For Goal Achievement: 11/22/17 Potential to Achieve Goals: Good    Frequency Min 2X/week   Barriers to discharge        Co-evaluation PT/OT/SLP Co-Evaluation/Treatment: Yes Reason for Co-Treatment: Complexity of the patient's impairments (multi-system involvement);For patient/therapist safety;To address functional/ADL transfers PT goals addressed during session: Mobility/safety with mobility;Balance;Proper use of DME         AM-PAC PT "6 Clicks" Daily Activity  Outcome Measure Difficulty turning over in bed (including adjusting bedclothes, sheets and blankets)?: Unable Difficulty moving from lying on back to sitting on the side of the bed? : Unable Difficulty sitting down on and standing up from a chair with arms (e.g., wheelchair, bedside commode, etc,.)?: Unable Help needed moving to and from a bed to chair (including a wheelchair)?: A Lot Help needed walking in hospital room?: A Little Help needed climbing 3-5 steps with a railing? : A Lot 6 Click Score: 10    End of Session Equipment Utilized During Treatment: Gait  belt Activity Tolerance: Patient tolerated treatment well;Patient limited by pain Patient left: in chair;with call bell/phone within reach;with chair alarm set Nurse Communication: Mobility status PT Visit Diagnosis: Unsteadiness on feet (R26.81);Other abnormalities of gait and mobility (R26.89);Muscle weakness (generalized) (M62.81)    Time: 1005-1040 PT Time Calculation (min) (ACUTE ONLY): 35 min   Charges:   PT Evaluation $PT Eval Moderate Complexity: 1 Mod         Kipp Laurence, PT, DPT 11/08/17 10:49 AM Pager: [phone]

## 2017-11-08 NOTE — Consult Note (Signed)
NEURO HOSPITALIST CONSULT NOTE   Requesting physician: Dr. Myna Hidalgo  Reason for Consult: Diffuse muscle and joint pain of acute onset limiting movement. History of lupus and polymyalgia rheumatica  History obtained from:   Patient and Chart     HPI:                                                                                                                                          Kerry Brooks is an 77 y.o. female with polymyalgia rheumatica and systemic lupus erythymatosus followed by Rheumatology and managed with Actemra and low-dose prednisone who woke up on Friday morning with excruciating pain in her arms and legs, especially her shoulder girdle, which was so severe that she could not get out of bed on her own. She called her daughter whose help was required to get her out of bed. She was brought to the ED at [facility] where she was afebrile and saturating well on RA. Plain films of her most painful joint, her right shoulder, showed degenerative changes and slight inferior subluxation. CT head showed no acute abnormality. Troponin was mildly elevated. She was treated with morphine. The ED assessment was suspicious for possible right sided weakness and transfer to [facility] was recommended for further evaluation.   She stated at OSH that the weakness was worse on her right side, but on interview here at [facility], she states that she is weak "all over" and that her pain is worse in her right shoulder than at other locations, which are nevertheless quite painful as well. She states her weakness is associated with the new onset, severe diffuse shoulder girdle and limb pain; she states the pain affects her muscles as well as her joints proximally and distally.   The patient states that her diffuse pain resembles prior lupus flare, but is much worse. She denies swallowing difficulty, vision changes or trouble talking. Her pain is worse in her shoulders and upper extremities than it is in her  legs.   Her PMHx also includes IDDM, HTN, asthma, UTI and Raynaud's disease.   Past Medical History:  Diagnosis Date  . Asthma   . Diabetes mellitus without complication ([place])   . Hypertension   . Lupus (systemic lupus erythematosus) ([place])   . Neuropathy   . Raynaud's disease   . Vertebral fracture, osteoporotic [facility])     Past Surgical History:  Procedure Laterality Date  . TONSILLECTOMY      History reviewed. No pertinent family history.            Social History:  reports that she has never smoked. She has never used smokeless tobacco. She reports that she does not drink alcohol or use drugs.  Allergies  Allergen Reactions  . Canagliflozin Other (See Comments)  Other reaction(s): Other (See Comments) unknown unknown  . Propoxyphene Other (See Comments)    unknown    MEDICATIONS:                                                                                                                     Prior to Admission:  Medications Prior to Admission  Medication Sig Dispense Refill Last Dose  . Albuterol Sulfate (VENTOLIN HFA IN) Inhale into the lungs.   11/06/2017 at Unknown time  . amLODipine (NORVASC) 5 MG tablet Take 5 mg by mouth.   11/06/2017 at Unknown time  . aspirin EC 81 MG tablet Take 81 mg by mouth daily.   11/06/2017 at Unknown time  . brinzolamide (AZOPT) 1 % ophthalmic suspension Frequency:   Dosage:0.0     Instructions:  Note:   11/06/2017 at Unknown time  . budesonide-formoterol (SYMBICORT) 160-4.5 MCG/ACT inhaler Frequency:   Dosage:0.0     Instructions:  Note:   11/06/2017 at Unknown time  . carvedilol (COREG) 25 MG tablet Take by mouth.   11/06/2017 at Unknown time  . diclofenac sodium (VOLTAREN) 1 % GEL Apply 2 g 3 (three) times daily topically. Frequency:   Dosage:0.0     Instructions:  Note: 200 g 3 11/06/2017 at Unknown time  . doxepin (SINEQUAN) 10 MG capsule Take 1 capsule (10 mg total) by mouth at bedtime. 305 capsule 5 11/06/2017 at Unknown time  .  gabapentin (NEURONTIN) 300 MG capsule TAKE 1 CAPSULE IN THE MORNING, TAKE 1 CAPSULE IN THE EVENING, AND TAKE 2 CAPSULES AT NIGHT 360 capsule 3 11/06/2017 at Unknown time  . Insulin Degludec (TRESIBA [place]) Inject into the skin.   11/06/2017 at Unknown time  . losartan-hydrochlorothiazide (HYZAAR) 100-12.5 MG per tablet Take by mouth.   11/06/2017 at Unknown time  . metFORMIN (GLUCOPHAGE) 500 MG tablet Take by mouth.   11/06/2017 at Unknown time  . potassium chloride (K-DUR) 10 MEQ tablet Take by mouth.   11/06/2017 at Unknown time  . Cyanocobalamin (VITAMIN B-12 CR) 1000 MCG TBCR Frequency:   Dosage:0.0     Instructions:  Note:   Taking  . cyclobenzaprine (FLEXERIL) 5 MG tablet Take one po at bedtime nightly and can take up to 3 a day as needed 90 tablet 5 Taking  . Ergocalciferol (VITAMIN D2) 2000 UNITS TABS Frequency:   Dosage:0     Instructions:  Note:take 5000 iu everyday   Taking  . fluticasone (FLONASE) 50 MCG/ACT nasal spray Frequency:   Dosage:0.0     Instructions:  Note:   Taking  . glucose blood (ONE TOUCH ULTRA TEST) test strip Use 1 test strip to check blood sugar twice daily   Taking  . HYDROcodone-acetaminophen (NORCO) 7.5-325 MG tablet Take 1 tablet by mouth 3 (three) times daily as needed. 90 tablet 0   . insulin glargine (LANTUS) 100 UNIT/ML injection Inject into the skin.   More than a month at Unknown time  . methylPREDNISolone acetate (DEPO-MEDROL) 40 MG/ML injection Inject  40 mg into the muscle.   More than a month at Unknown time  . montelukast (SINGULAIR) 10 MG tablet TAKE 1 TABLET BY MOUTH EVERY DAY AT NIGHT  11   . Multiple Vitamin (MULTIVITAMIN) capsule Take by mouth.   Taking  . pantoprazole (PROTONIX) 40 MG tablet Take 40 mg by mouth.   Taking  . pravastatin (PRAVACHOL) 40 MG tablet Take by mouth.   Taking  . predniSONE (DELTASONE) 5 MG tablet Take by mouth.   Taking  . PROCTOSOL HC 2.5 % rectal cream INSERT 1 APPLICATORFUL RECTALLY AT BEDTIME AS NEEDED.  0 Taking  .  Tocilizumab (ACTEMRA IV) Inject into the vein every 30 (thirty) days.   Taking   Scheduled: . aspirin EC  81 mg Oral Daily  . brinzolamide  1 drop Both Eyes TID  . diclofenac sodium  2 g Topical TID  . doxepin  10 mg Oral QHS  . enoxaparin (LOVENOX) injection  40 mg Subcutaneous Daily  . gabapentin  300 mg Oral BID WC   And  . gabapentin  600 mg Oral QHS  . insulin aspart  0-5 Units Subcutaneous QHS  . insulin aspart  0-9 Units Subcutaneous TID WC  . insulin glargine  20 Units Subcutaneous Daily  . mometasone-formoterol  2 puff Inhalation BID  . montelukast  10 mg Oral QHS  . ondansetron      . pantoprazole  40 mg Oral Daily  . pravastatin  40 mg Oral q1800  . predniSONE  5 mg Oral Q breakfast   Continuous: . 0.9 % NaCl with KCl 20 mEq / L 100 mL/hr at 11/08/17 0142  . cefTRIAXone (ROCEPHIN)  IV 1 g (11/08/17 0050)     ROS:                                                                                                                                       As per HPI.    Blood pressure 107/76, pulse 86, temperature (!) 97.5 F (36.4 C), temperature source Oral, resp. rate 18, height 5' 3"  (1.6 m), weight 91.6 kg, SpO2 100 %.   General Examination:                                                                                                       Physical Exam  HEENT-  [place]/AT    Lungs- Respirations unlabored. No tachypnea noted.  Extremities- Patchy tenderness to palpation of muscle beds and joints proximally and  distally in upper and lower extremities. No edema.   Neurological Examination Mental Status: Alert, fully oriented, non-agitated.  Speech fluent without evidence of aphasia.  Able to follow all commands without difficulty. Cranial Nerves: II: Visual fields grossly normal with no extinction to DSS. PERRL.   III,IV, VI: No ptosis. EOMI without nystagmus.  V,VII: No facial droop. Temp sensation equal bilaterally.  VIII: hearing intact to voice IX,X: Mild  hypophonia. No hoarseness.  XI: Patient refuses to shrug shoulders due to pain XII: midline tongue extension Motor: Patient refuses formal strength testing of upper and lower extremities bilaterally due to severe pain elicited by both passive and active movement. Joint pain to passive movement of some digits, at elbows, shoulders, hips, knees and ankles bilaterally. Patchy severe tenderness to palpation of muscle beds and joints proximally and distally in upper and lower extremities also noted. She will exert maximum of 2/5 strength in all 4 extremities proximally and distally due to severe pain. Tone decreased in upper and lower extremities. No asymmetry. Severely decreased muscle bulk is apparent in all 4 extremities, worse proximally, when palpating deep to her prominent adipose tissue.   Sensory: Temp and light touch intact proximally in all 4 extremities without extinction.  Deep Tendon Reflexes: No elicitable reflexes in upper and lower extremities bilaterally.  Plantars: Mute bilaterally.  Cerebellar: Patient unable to cooperate due to severe pain.  Gait: Deferred   Lab Results: Basic Metabolic Panel: Recent Labs  Lab 11/07/17 1519  NA 137  K 3.2*  CL 99  CO2 25  GLUCOSE 171*  BUN 13  CREATININE 0.57  CALCIUM 8.7*    CBC: Recent Labs  Lab 11/07/17 1519  WBC 6.3  NEUTROABS 4.2  HGB 12.2  HCT 37.1  MCV 91.4  PLT 122*    Cardiac Enzymes: Recent Labs  Lab 11/07/17 1519 11/07/17 1835 11/07/17 2253  TROPONINI 0.05* 0.06* 0.04*    Lipid Panel: No results for input(s): CHOL, TRIG, HDL, CHOLHDL, VLDL, LDLCALC in the last 168 hours.  Imaging: Dg Shoulder Right  Result Date: 11/07/2017 CLINICAL DATA:  Acute onset of right shoulder pain this morning. No known injury. EXAM: RIGHT SHOULDER - 2+ VIEW COMPARISON:  None. FINDINGS: Moderate glenohumeral joint degenerative changes with osteophytic spurring. There are also mild to moderate AC joint degenerative changes.  Widened humeroacromial space and slight inferior subluxation of the humeral head could possibly be due to a joint effusion. Bony density near the greater tuberosity likely frayed fleck ting calcific tendinitis. The visualized right ribs are intact.  No worrisome lung lesions. IMPRESSION: Moderate glenohumeral joint degenerative changes. Calcific tendinitis involving the rotator cuff tendons distally near the attachment site on the greater tuberosity. Widened humeroacromial space and slight inferior subluxation of the humeral head could be due to a joint effusion. Electronically Signed   By: Marijo Sanes M.D.   On: 11/07/2017 16:51   Ct Head Wo Contrast  Result Date: 11/07/2017 CLINICAL DATA:  77 year old with right arm weakness and numbness. Stroke suspected. EXAM: CT HEAD WITHOUT CONTRAST CT CERVICAL SPINE WITHOUT CONTRAST TECHNIQUE: Multidetector CT imaging of the head and cervical spine was performed following the standard protocol without intravenous contrast. Multiplanar CT image reconstructions of the cervical spine were also generated. COMPARISON:  Cervical spine MRI 07/31/2017 FINDINGS: CT HEAD FINDINGS Brain: Mild cerebral atrophy. No evidence for acute hemorrhage, mass lesion, midline shift, hydrocephalus or large infarct. Subtle low-density in the periventricular white matter. Vascular: No hyperdense vessel or unexpected calcification. Skull: Normal. Negative  for fracture or focal lesion. Sinuses/Orbits: Small amount of focal disease in the left sphenoid sinus. Other: None. CT CERVICAL SPINE FINDINGS Alignment: Again noted is minimal anterolisthesis at C4-C5. Skull base and vertebrae: No acute fracture. No primary bone lesion or focal pathologic process. Soft tissues and spinal canal: Scattered calcifications throughout the bilateral parotid glands, right side greater than left. No paravertebral soft tissue abnormality. Thyroid tissue is mildly prominent. Disc levels: Severe multilevel degenerative  facet disease. Ankylosis of the right C2-C3 facets. Degenerative changes along the right side of the occiput and C1. Prominent central disc bulge or disc osteophyte complex at C5-C6. Upper chest: Negative. Other: None IMPRESSION: No acute intracranial abnormality. 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 Electronically Signed   By: Markus Daft M.D.   On: 11/07/2017 15:52   Ct Cervical Spine Wo Contrast  Result Date: 11/07/2017 CLINICAL DATA:  77 year old with right arm weakness and numbness. Stroke suspected. EXAM: CT HEAD WITHOUT CONTRAST CT CERVICAL SPINE WITHOUT CONTRAST TECHNIQUE: Multidetector CT imaging of the head and cervical spine was performed following the standard protocol without intravenous contrast. Multiplanar CT image reconstructions of the cervical spine were also generated. COMPARISON:  Cervical spine MRI 07/31/2017 FINDINGS: CT HEAD FINDINGS Brain: Mild cerebral atrophy. No evidence for acute hemorrhage, mass lesion, midline shift, hydrocephalus or large infarct. Subtle low-density in the periventricular white matter. Vascular: No hyperdense vessel or unexpected calcification. Skull: Normal. Negative for fracture or focal lesion. Sinuses/Orbits: Small amount of focal disease in the left sphenoid sinus. Other: None. CT CERVICAL SPINE FINDINGS Alignment: Again noted is minimal anterolisthesis at C4-C5. Skull base and vertebrae: No acute fracture. No primary bone lesion or focal pathologic process. Soft tissues and spinal canal: Scattered calcifications throughout the bilateral parotid glands, right side greater than left. No paravertebral soft tissue abnormality. Thyroid tissue is mildly prominent. Disc levels: Severe multilevel degenerative facet disease. Ankylosis of the right C2-C3 facets. Degenerative changes along the right side of the occiput and C1. Prominent central disc bulge or disc osteophyte complex at C5-C6. Upper chest: Negative. Other:  None IMPRESSION: No acute intracranial abnormality. 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 Electronically Signed   By: Markus Daft M.D.   On: 11/07/2017 15:52   Mr Brain Wo Contrast  Result Date: 11/08/2017 CLINICAL DATA:  Follow-up suspected stroke. EXAM: MRI HEAD WITHOUT CONTRAST TECHNIQUE: Multiplanar, multiecho pulse sequences of the brain and surrounding structures were obtained without intravenous contrast. COMPARISON:  CT HEAD November 07, 2017 FINDINGS: INTRACRANIAL CONTENTS: No reduced diffusion to suggest acute ischemia. No susceptibility artifact to suggest hemorrhage. The ventricles and sulci are normal for patient's age. Patchy supratentorial and pontine white matter FLAIR T2 hyperintensities. No suspicious parenchymal signal, masses, mass effect. No abnormal extra-axial fluid collections. Mildly prominent RIGHT posterior fossa and RIGHT greater than LEFT frontal extra-axial spaces. No extra-axial masses. VASCULAR: Normal major intracranial vascular flow voids present at skull base. SKULL AND UPPER CERVICAL SPINE: No abnormal sellar expansion. No suspicious calvarial bone marrow signal. Craniocervical junction maintained. SINUSES/ORBITS: Mild paranasal sinus mucosal thickening without air-fluid levels. Maxillary air cells are well aerated. Included ocular globes and orbital contents are non-suspicious. Status post RIGHT ocular lens implant. OTHER: None. IMPRESSION: 1. No acute intracranial process. 2. Mild-to-moderate chronic small vessel ischemic changes. Electronically Signed   By: Elon Alas M.D.   On: 11/08/2017 00:41    Assessment: 77 year old female presenting with severe diffuse muscle and joint pain  in conjunction with diffuse weakness. History of lupus and polymyalgia rheumatica 1. She stated at OSH that the weakness was worse on her right side, but on interview here at [facility], she states that she is weak "all over" and that her pain is  worse in her right shoulder than at other locations, which are nevertheless quite painful as well. She states her weakness is associated with new onset, severe diffuse shoulder girdle and limb pain; she states the pain affects her muscles as well as her joints proximally and distally, which contributes significantly to her apparent weakness, as movement elicits severe muscle and joint pain. 2. The patient also states that her current diffuse pain symptoms resemble one of her prior lupus flares, but is much worse.  3. Exam findings are not consistent with stroke.  4. MRI is negative for acute stroke or other acute abnormality.  5. No visual complaints or headache to suggest temporal arteritis.  6. Overall clinical impression is that the current presentation is most likely secondary to a flare of her lupus or acutely worsened polymyalgia rheumatica  Recommendations: 1. ESR, c-reactive protein, CK level 2. Consider a urine myoglobin level 3. Rheumatology consult to assess indication for increasing prednisone dose or starting pulsed dose steroids followed by a prednisone taper 4. Discontinue pravastatin 5. PT/OT when able to tolerate   Electronically signed: Dr. Kerney Elbe 11/08/2017, 1:31 AM

## 2017-11-08 NOTE — Progress Notes (Signed)
Pt returned from MRI °

## 2017-11-08 NOTE — Evaluation (Signed)
Occupational Therapy Evaluation Patient Details Name: Kerry Brooks MRN: [id number] DOB: 02/04/1941 Today's Date: 11/08/2017    History of Present Illness Patient is a 77 y/o female presenting with R UE pain and weakness. Radiographs of the right shoulder demonstrate degenerative changes of the right glenohumeral joint, calcific tendinitis involving the rotator cuff tendons, and widened humeral acromial space with slight inferior subluxation that could possibly be due to an effusion.  Noncontrast head CT is negative for acute intracranial abnormality and no acute osseous abnormalities were noted on the cervical spine CT. In the ED reports pain and weakness "all over". PMH significant for polymyalgia rheumatica and systemic lupus erythymatosus followed by Rheumatology and managed with Actemra and low-dose prednisone.   Clinical Impression   Pt admitted with the above diagnoses and presents with below problem list. Pt will benefit from continued acute OT to address the below listed deficits and maximize independence with basic ADLs prior to d/c to next venue. PTA pt was mod I with ADLs. Pt is currently mod to max A +2 for LB ADLs and transfers, min - max A for UB ADLs. She is able to bring a cup to her mouth to drink with setup provided.      Follow Up Recommendations  SNF    Equipment Recommendations  Other (comment)(defer to next venue)    Recommendations for Other Services       Precautions / Restrictions Precautions Precautions: Fall Restrictions Weight Bearing Restrictions: No      Mobility Bed Mobility Overal bed mobility: Needs Assistance Bed Mobility: Supine to Sit     Supine to sit: Mod assist;+2 for physical assistance     General bed mobility comments: use of bed pad for hip control; does not attempt to use UE  Transfers Overall transfer level: Needs assistance Equipment used: Rolling walker (2 wheeled) Transfers: Sit to/from Stand Sit to Stand: Mod assist;+2  physical assistance         General transfer comment: Mod A +2 to power up at bedside; Verbal cueing to stand erect    Balance Overall balance assessment: Mild deficits observed, not formally tested                                         ADL either performed or assessed with clinical judgement   ADL Overall ADL's : Needs assistance/impaired Eating/Feeding: Sitting;Set up Eating/Feeding Details (indicate cue type and reason): able to bring cup to mouth with only setup provided.  Grooming: Moderate assistance;Sitting   Upper Body Bathing: Maximal assistance;Sitting   Lower Body Bathing: Moderate assistance;+2 for physical assistance;Sit to/from stand   Upper Body Dressing : Maximal assistance;Sitting   Lower Body Dressing: Moderate assistance;+2 for physical assistance;Sit to/from stand   Toilet Transfer: Moderate assistance;+2 for physical assistance;Ambulation;RW   Toileting- Clothing Manipulation and Hygiene: Maximal assistance;+2 for physical assistance;Sit to/from stand       Functional mobility during ADLs: Moderate assistance;+2 for physical assistance;+2 for safety/equipment General ADL Comments: Pt completed bed mobility and functional mobility around length of bed to reach recliner     Vision         Perception     Praxis      Pertinent Vitals/Pain Pain Assessment: 0-10 Pain Score: 8  Pain Location: "all over" Pain Descriptors / Indicators: Aching;Discomfort;Grimacing;Moaning Pain Intervention(s): Limited activity within patient's tolerance;Monitored during session;Repositioned     Hand Dominance  Extremity/Trunk Assessment Upper Extremity Assessment Upper Extremity Assessment: Generalized weakness(difficult to assess 2/2 pain)   Lower Extremity Assessment Lower Extremity Assessment: Defer to PT evaluation   Cervical / Trunk Assessment Cervical / Trunk Assessment: Kyphotic   Communication Communication Communication: No  difficulties   Cognition Arousal/Alertness: Awake/alert Behavior During Therapy: WFL for tasks assessed/performed Overall Cognitive Status: Within Functional Limits for tasks assessed                                     General Comments       Exercises     Shoulder Instructions      Home Living Family/patient expects to be discharged to:: Private residence Living Arrangements: Alone Available Help at Discharge: Family;Available PRN/intermittently Type of Home: House Home Access: Stairs to enter [employer] of Steps: 3 Entrance Stairs-Rails: Can reach both Home Layout: Multi-level;Able to live on main level with bedroom/bathroom     Bathroom Shower/Tub: Chief Strategy Officer: Standard     Home Equipment: Cane - single point;Walker - 4 wheels;Tub bench          Prior Functioning/Environment Level of Independence: Independent with assistive device(s)        Comments: has someone that does her driving, and household chores. "I start getting ready for church at 5am and to leave at 10am."        OT Problem List: Decreased activity tolerance;Impaired balance (sitting and/or standing);Decreased strength;Decreased knowledge of use of DME or AE;Decreased knowledge of precautions;Impaired UE functional use;Pain      OT Treatment/Interventions: Self-care/ADL training;Therapeutic exercise;DME and/or AE instruction;Therapeutic activities;Patient/family education;Balance training    OT Goals(Current goals can be found in the care plan section) Acute Rehab OT Goals Patient Stated Goal: return home, reduce pain OT Goal Formulation: With patient Time For Goal Achievement: 11/22/17 Potential to Achieve Goals: Good ADL Goals Pt Will Perform Grooming: with min guard assist;sitting Pt Will Perform Upper Body Bathing: with min assist Pt Will Perform Lower Body Bathing: with mod assist Pt Will Transfer to Toilet: with mod assist;ambulating Pt  Will Perform Toileting - Clothing Manipulation and hygiene: with mod assist;sit to/from stand  OT Frequency: Min 2X/week   Barriers to D/C:            Co-evaluation PT/OT/SLP Co-Evaluation/Treatment: Yes Reason for Co-Treatment: Complexity of the patient's impairments (multi-system involvement);For patient/therapist safety;To address functional/ADL transfers   OT goals addressed during session: ADL's and self-care      AM-PAC PT "6 Clicks" Daily Activity     Outcome Measure Help from another person eating meals?: A Little Help from another person taking care of personal grooming?: A Lot Help from another person toileting, which includes using toliet, bedpan, or urinal?: A Lot Help from another person bathing (including washing, rinsing, drying)?: A Lot Help from another person to put on and taking off regular upper body clothing?: A Lot Help from another person to put on and taking off regular lower body clothing?: A Lot 6 Click Score: 13   End of Session Equipment Utilized During Treatment: Gait belt;Rolling walker Nurse Communication: Mobility status  Activity Tolerance: Patient limited by pain;Patient limited by fatigue;Patient tolerated treatment well Patient left: in chair;with call bell/phone within reach  OT Visit Diagnosis: Unsteadiness on feet (R26.81);Other abnormalities of gait and mobility (R26.89);Pain                Time: 1010-1040 OT Time  Calculation (min): 30 min Charges:  OT General Charges $OT Visit: 1 Visit OT Evaluation $OT Eval Moderate Complexity: 1 Mod  Raynald KempKathryn Dallie Patton, OT Acute Rehabilitation Services Pager: [phone] Office: [phone]   Pilar GrammesMathews, Chereese Cilento H 11/08/2017, 1:09 PM

## 2017-11-09 DIAGNOSIS — K219 Gastro-esophageal reflux disease without esophagitis: Secondary | ICD-10-CM | POA: Diagnosis present

## 2017-11-09 DIAGNOSIS — Z87310 Personal history of (healed) osteoporosis fracture: Secondary | ICD-10-CM | POA: Diagnosis not present

## 2017-11-09 DIAGNOSIS — J4530 Mild persistent asthma, uncomplicated: Secondary | ICD-10-CM | POA: Diagnosis present

## 2017-11-09 DIAGNOSIS — M353 Polymyalgia rheumatica: Secondary | ICD-10-CM | POA: Diagnosis present

## 2017-11-09 DIAGNOSIS — E119 Type 2 diabetes mellitus without complications: Secondary | ICD-10-CM | POA: Diagnosis not present

## 2017-11-09 DIAGNOSIS — R531 Weakness: Secondary | ICD-10-CM | POA: Diagnosis not present

## 2017-11-09 DIAGNOSIS — I4510 Unspecified right bundle-branch block: Secondary | ICD-10-CM | POA: Diagnosis present

## 2017-11-09 DIAGNOSIS — M329 Systemic lupus erythematosus, unspecified: Secondary | ICD-10-CM | POA: Diagnosis present

## 2017-11-09 DIAGNOSIS — R7989 Other specified abnormal findings of blood chemistry: Secondary | ICD-10-CM | POA: Diagnosis present

## 2017-11-09 DIAGNOSIS — Z885 Allergy status to narcotic agent status: Secondary | ICD-10-CM | POA: Diagnosis not present

## 2017-11-09 DIAGNOSIS — G8321 Monoplegia of upper limb affecting right dominant side: Secondary | ICD-10-CM | POA: Diagnosis present

## 2017-11-09 DIAGNOSIS — E1142 Type 2 diabetes mellitus with diabetic polyneuropathy: Secondary | ICD-10-CM | POA: Diagnosis present

## 2017-11-09 DIAGNOSIS — Z7951 Long term (current) use of inhaled steroids: Secondary | ICD-10-CM | POA: Diagnosis not present

## 2017-11-09 DIAGNOSIS — I7300 Raynaud's syndrome without gangrene: Secondary | ICD-10-CM | POA: Diagnosis present

## 2017-11-09 DIAGNOSIS — I639 Cerebral infarction, unspecified: Secondary | ICD-10-CM | POA: Diagnosis present

## 2017-11-09 DIAGNOSIS — Z6835 Body mass index (BMI) 35.0-35.9, adult: Secondary | ICD-10-CM | POA: Diagnosis not present

## 2017-11-09 DIAGNOSIS — T380X5A Adverse effect of glucocorticoids and synthetic analogues, initial encounter: Secondary | ICD-10-CM | POA: Diagnosis present

## 2017-11-09 DIAGNOSIS — M25511 Pain in right shoulder: Secondary | ICD-10-CM | POA: Diagnosis not present

## 2017-11-09 DIAGNOSIS — M3219 Other organ or system involvement in systemic lupus erythematosus: Secondary | ICD-10-CM | POA: Diagnosis not present

## 2017-11-09 DIAGNOSIS — N390 Urinary tract infection, site not specified: Secondary | ICD-10-CM | POA: Diagnosis present

## 2017-11-09 DIAGNOSIS — M7531 Calcific tendinitis of right shoulder: Secondary | ICD-10-CM | POA: Diagnosis present

## 2017-11-09 DIAGNOSIS — Z7982 Long term (current) use of aspirin: Secondary | ICD-10-CM | POA: Diagnosis not present

## 2017-11-09 DIAGNOSIS — E669 Obesity, unspecified: Secondary | ICD-10-CM | POA: Diagnosis present

## 2017-11-09 DIAGNOSIS — Z794 Long term (current) use of insulin: Secondary | ICD-10-CM

## 2017-11-09 DIAGNOSIS — I10 Essential (primary) hypertension: Secondary | ICD-10-CM | POA: Diagnosis present

## 2017-11-09 DIAGNOSIS — E876 Hypokalemia: Secondary | ICD-10-CM | POA: Diagnosis present

## 2017-11-09 DIAGNOSIS — E871 Hypo-osmolality and hyponatremia: Secondary | ICD-10-CM | POA: Diagnosis present

## 2017-11-09 DIAGNOSIS — R748 Abnormal levels of other serum enzymes: Secondary | ICD-10-CM | POA: Diagnosis not present

## 2017-11-09 DIAGNOSIS — E1165 Type 2 diabetes mellitus with hyperglycemia: Secondary | ICD-10-CM | POA: Diagnosis present

## 2017-11-09 DIAGNOSIS — Z888 Allergy status to other drugs, medicaments and biological substances status: Secondary | ICD-10-CM | POA: Diagnosis not present

## 2017-11-09 LAB — CBC
HCT: 38.6 % (ref 36.0–46.0)
Hemoglobin: 11.8 g/dL — ABNORMAL LOW (ref 12.0–15.0)
MCH: 29.6 pg (ref 26.0–34.0)
MCHC: 30.6 g/dL (ref 30.0–36.0)
MCV: 97 fL (ref 78.0–100.0)
PLATELETS: 135 10*3/uL — AB (ref 150–400)
RBC: 3.98 MIL/uL (ref 3.87–5.11)
RDW: 13.4 % (ref 11.5–15.5)
WBC: 7 10*3/uL (ref 4.0–10.5)

## 2017-11-09 LAB — BASIC METABOLIC PANEL
Anion gap: 11 (ref 5–15)
BUN: 18 mg/dL (ref 8–23)
CHLORIDE: 103 mmol/L (ref 98–111)
CO2: 20 mmol/L — ABNORMAL LOW (ref 22–32)
Calcium: 7.7 mg/dL — ABNORMAL LOW (ref 8.9–10.3)
Creatinine, Ser: 0.85 mg/dL (ref 0.44–1.00)
GFR calc Af Amer: >60 mL/min (ref >60)
GLUCOSE: 235 mg/dL — AB (ref 70–99)
POTASSIUM: 4.1 mmol/L (ref 3.5–5.1)
Sodium: 134 mmol/L — ABNORMAL LOW (ref 135–145)

## 2017-11-09 LAB — GLUCOSE, CAPILLARY
GLUCOSE-CAPILLARY: 207 mg/dL — AB (ref 70–99)
GLUCOSE-CAPILLARY: 219 mg/dL — AB (ref 70–99)
Glucose-Capillary: 385 mg/dL — ABNORMAL HIGH (ref 70–99)
Glucose-Capillary: 351 mg/dL — ABNORMAL HIGH (ref 70–99)

## 2017-11-09 MED ORDER — CARVEDILOL 12.5 MG PO TABS
25.0000 mg | ORAL_TABLET | Freq: Two times a day (BID) | ORAL | Status: DC
  Administered 2017-11-09 – 2017-11-12 (×7): 25 mg via ORAL
  Filled 2017-11-09 (×7): qty 2

## 2017-11-09 MED ORDER — PREDNISONE 5 MG PO TABS
10.0000 mg | ORAL_TABLET | Freq: Two times a day (BID) | ORAL | Status: DC
  Administered 2017-11-09 – 2017-11-12 (×7): 10 mg via ORAL
  Filled 2017-11-09 (×7): qty 2

## 2017-11-09 MED ORDER — INSULIN GLARGINE 100 UNIT/ML ~~LOC~~ SOLN
26.0000 [IU] | Freq: Every day | SUBCUTANEOUS | Status: DC
  Administered 2017-11-09 – 2017-11-10 (×2): 26 [IU] via SUBCUTANEOUS
  Filled 2017-11-09 (×2): qty 0.26

## 2017-11-09 MED ORDER — MAGNESIUM SULFATE 2 GM/50ML IV SOLN
2.0000 g | Freq: Once | INTRAVENOUS | Status: AC
  Administered 2017-11-09: 2 g via INTRAVENOUS
  Filled 2017-11-09: qty 50

## 2017-11-09 NOTE — Evaluation (Signed)
Speech Language Pathology Evaluation Patient Details Name: Kerry Brooks MRN: [id number] DOB: Jun 18, 1940 Today's Date: 11/09/2017 Time: [id number] SLP Time Calculation (min) (ACUTE ONLY): 10 min  Problem List:  Patient Active Problem List   Diagnosis Date Noted  . CVA (cerebral vascular accident) (HCC) 11/09/2017  . Suspected stroke patient last known to be well more than 2 hours ago 11/07/2017  . Acute right-sided weakness 11/07/2017  . Hypokalemia 11/07/2017  . Elevated troponin 11/07/2017  . Neck pain 05/09/2016  . Urinary incontinence 01/02/2016  . Acquired hallux valgus 08/22/2015  . Hammer toe 08/22/2015  . Burning with urination 06/20/2015  . Other long term (current) drug therapy 06/20/2015  . Gonalgia 06/19/2015  . Allergic rhinitis 02/15/2015  . Cervical osteoarthritis 02/15/2015  . Acid reflux 02/15/2015  . BP (high blood pressure) 02/15/2015  . LBP (low back pain) 02/15/2015  . Microalbuminuria 02/15/2015  . Diabetic retinopathy associated with type 2 diabetes mellitus (HCC) 02/15/2015  . Asthma, mild persistent 02/15/2015  . 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 B12 deficiency 02/15/2015  . Diabetes mellitus (HCC) 12/16/2014  . Bilateral low back pain with bilateral sciatica 12/16/2014  . Insulin-requiring or dependent type II diabetes mellitus (HCC) 05/17/2014  . Lower back pain 05/17/2014  . Sciatica 05/17/2014  . Lumbar vertebral fracture (HCC) 05/17/2014  . Acute pain of right shoulder 05/17/2014  . Insomnia 05/17/2014  . Fracture of lumbar vertebra (HCC) 05/17/2014  . Airway hyperreactivity 08/01/2012  . Bunion 08/01/2012  . Cataract 08/01/2012  . Diaphragmatic hernia 08/01/2012  . D (diarrhea) 08/01/2012  . Essential (primary) hypertension 08/01/2012  . Glaucoma 08/01/2012  . HLD (hyperlipidemia) 08/01/2012  . 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 N&V (nausea and vomiting) 08/01/2012  . Arthralgia of lower leg 08/01/2012  .  Arthralgia of hip or thigh 08/01/2012  . Paroxysmal digital cyanosis 08/01/2012  . Diabetes mellitus, type 2 (HCC) 08/01/2012  . FOM (frequency of micturition) 08/01/2012  . Emesis 08/01/2012  . Systemic lupus erythematosus (HCC) 08/01/2012  . Type 2 diabetes mellitus (HCC) 08/01/2012   Past Medical History:  Past Medical History:  Diagnosis Date  . Asthma   . Diabetes mellitus without complication (HCC)   . Hypertension   . Lupus (systemic lupus erythematosus) (HCC)   . Neuropathy   . Raynaud's disease   . Vertebral fracture, osteoporotic [facility](HCC)    Past Surgical History:  Past Surgical History:  Procedure Laterality Date  . TONSILLECTOMY     HPI:  Patient is a 77 y/o female presenting with R UE pain and weakness. Radiographs of the right shoulder demonstrate degenerative changes of the right glenohumeral joint, calcific tendinitis involving the rotator cuff tendons, and widened humeral acromial space with slight inferior subluxation that could possibly be due to an effusion.  Noncontrast head CT is negative for acute intracranial abnormality and no acute osseous abnormalities were noted on the cervical spine CT. In the ED reports pain and weakness "all over". PMH significant for polymyalgia rheumatica and systemic lupus erythymatosus followed by Rheumatology and managed with Actemra and low-dose prednisone.   Assessment / Plan / Recommendation Clinical Impression  Patient has no cognitive, speech and language deficits. No speech therapy needs. Signing off.                SLP Evaluation Cognition  Overall Cognitive Status: Within Functional Limits for tasks assessed Arousal/Alertness: Awake/alert Orientation Level: Oriented X4 Attention: Focused Focused Attention: Appears intact Memory: Appears intact Awareness: Appears intact Problem Solving: Appears  intact Safety/Judgment: Appears intact       Comprehension  Auditory Comprehension Overall Auditory Comprehension:  Appears within functional limits for tasks assessed Yes/No Questions: Within Functional Limits Conversation: Complex Visual Recognition/Discrimination Discrimination: Within Function Limits Reading Comprehension Reading Status: Within funtional limits    Expression Verbal Expression Overall Verbal Expression: Appears within functional limits for tasks assessed Initiation: No impairment Repetition: No impairment Naming: No impairment Pragmatics: No impairment Interfering Components: Attention Written Expression Dominant Hand: Right   Oral / Motor  Oral Motor/Sensory Function Overall Oral Motor/Sensory Function: Within functional limits Motor Speech Overall Motor Speech: Appears within functional limits for tasks assessed Respiration: Within functional limits Phonation: Normal Articulation: Within functional limitis Intelligibility: Intelligible Motor Planning: Witnin functional limits Motor Speech Errors: Not applicable   GO                    [name] [name], MA, CCC-SLP 11/09/2017 4:39 PM

## 2017-11-09 NOTE — Progress Notes (Signed)
Progress Note    Kerry Brooks  ZOX:[id number] DOB: 11/07/1940  DOA: 11/07/2017 PCP: Brooke BonitoGallemore, [place], MD    Brief Narrative:     Medical records reviewed and are as summarized below:  Kerry Brooks is an 77 y.o. female with medical history significant for insulin-dependent diabetes mellitus, asthma, hypertension, polymyalgia rheumatica, lupus, and recent urinary symptoms started on antibiotics for UTI 2 days ago, now presenting to the emergency department with pain and weakness involving the right arm.  Patient reports that she has experienced a lot of shoulder pain, usually more so on the left, and has received intra-articular injections for this.  She had been experiencing some urinary symptoms and nausea last night, but woke this morning with severe pain in the right shoulder and arm, as well as right arm weakness.  Now with pain all over.    Assessment/Plan:   Principal Problem:   Acute right-sided weakness Active Problems:   Insulin-requiring or dependent type II diabetes mellitus (HCC)   Essential (primary) hypertension   Acute pain of right shoulder   Systemic lupus erythematosus (HCC)   Asthma, mild persistent   Polymyalgia rheumatica (HCC)   Suspected stroke patient last known to be well more than 2 hours ago   Hypokalemia   Elevated troponin   Right arm and now left arm pain/weakness - Presented initally with severe pain in right shoulder and arm, as well as RUE weakness  - Head CT negative for acute findings  - Neurology consulting and much appreciated  - MRI brain negative for acute issues - PT/OT- SNF -prednisone was decreased from 10 mg to 5 mg a few weeks ago -d/c statin   PMR; SLE  - Follows with rheumatology, managed with Actemra and low-dose prednisone  - increased prednisone from 5 mg to 20 mg BID -suspect having flare of lupus vs PMR -follows with: Francee GentileAldona Ziolkowska, MD  -recent steroid injections in lower back, knee and  shoulder  Elevated troponin  - Troponins flat -no chest pain   Insulin-dependent DM  - A1c was 7.8% this month  - Managed at home with metformin and Tresiba 36 units qAM  - increase lantus -SSI   Hypertension  - BP at goal   Hypokalemia  - Serum potassium is 3.2 on admission  - repleted -replace Mg  Asthma, mild-persistent  - No SOB or wheezing  - Continue Singulair, ICS/LABA, and prn albuterol   UTI  - She was started on cefuroxime 11/05/17 for dysuria and UA compatible with infection  - Symptoms have improved, no fever or leukocytosis  - Continue antibiotics, follow culture   obesity Body mass index is 35.78 kg/m.   Family Communication/Anticipated D/C date and plan/Code Status   DVT prophylaxis: Lovenox ordered. Code Status: Full Code.  Family Communication: none at bedside Disposition Plan: SNF if able otherwise will need continued PT/OT and increased family support at home   Medical Consultants:    Neuro     Subjective:   Just decreased to 5 mg from 10mg  of prednisone as few weeks ago  Objective:    Vitals:   11/08/17 2325 11/09/17 0333 11/09/17 0755 11/09/17 0819  BP: 133/79 (!) 133/46  (!) 148/92  Pulse: (!) 101 90 (!) 108 (!) 113  Resp: 18 20 18 15   Temp: 98.2 F (36.8 C) 98 F (36.7 C)  97.7 F (36.5 C)  TempSrc: Oral Oral  Oral  SpO2: 100% 97% 95% 100%  Weight:  Height:        Intake/Output Summary (Last 24 hours) at 11/09/2017 0825 Last data filed at 11/08/2017 1317 Gross per 24 hour  Intake 568.69 ml  Output -  Net 568.69 ml   Filed Weights   11/07/17 1456 11/07/17 2115  Weight: 91.6 kg 91.6 kg    Exam: In bed, uncomfortable appearing with movement Sinus tachy No increased work of breathing Right shoulder tender to palpation, limited movement  Data Reviewed:   I have personally reviewed following labs and imaging studies:  Labs: Labs show the following:   Basic Metabolic Panel: Recent Labs  Lab  11/07/17 1519 11/08/17 1045  NA 137  --   K 3.2*  --   CL 99  --   CO2 25  --   GLUCOSE 171*  --   BUN 13  --   CREATININE 0.57  --   CALCIUM 8.7*  --   MG  --  1.7   GFR Estimated Creatinine Clearance: 63.3 mL/min (by C-G formula based on SCr of 0.57 mg/dL). Liver Function Tests: Recent Labs  Lab 11/07/17 1519  AST 28  ALT 18  ALKPHOS 66  BILITOT 0.6  PROT 7.0  ALBUMIN 4.0   No results for input(s): LIPASE, AMYLASE in the last 168 hours. No results for input(s): AMMONIA in the last 168 hours. Coagulation profile Recent Labs  Lab 11/07/17 1519  INR 1.02    CBC: Recent Labs  Lab 11/07/17 1519  WBC 6.3  NEUTROABS 4.2  HGB 12.2  HCT 37.1  MCV 91.4  PLT 122*   Cardiac Enzymes: Recent Labs  Lab 11/07/17 1519 11/07/17 1835 11/07/17 2253 11/08/17 0736  CKTOTAL  --   --   --  52  TROPONINI 0.05* 0.06* 0.04*  --    BNP (last 3 results) No results for input(s): PROBNP in the last 8760 hours. CBG: Recent Labs  Lab 11/08/17 0700 11/08/17 1150 11/08/17 1712 11/08/17 2118 11/09/17 0604  GLUCAP 139* 206* 232* 207* 207*   D-Dimer: No results for input(s): DDIMER in the last 72 hours. Hgb A1c: Recent Labs    11/08/17 0736  HGBA1C 7.8*   Lipid Profile: Recent Labs    11/08/17 0736  CHOL 158  HDL 61  LDLCALC 82  TRIG 75  CHOLHDL 2.6   Thyroid function studies: No results for input(s): TSH, T4TOTAL, T3FREE, THYROIDAB in the last 72 hours.  Invalid input(s): FREET3 Anemia work up: No results for input(s): VITAMINB12, FOLATE, FERRITIN, TIBC, IRON, RETICCTPCT in the last 72 hours. Sepsis Labs: Recent Labs  Lab 11/07/17 1519  WBC 6.3    Microbiology No results found for this or any previous visit (from the past 240 hour(s)).  Procedures and diagnostic studies:  Dg Shoulder Right  Result Date: 11/07/2017 CLINICAL DATA:  Acute onset of right shoulder pain this morning. No known injury. EXAM: RIGHT SHOULDER - 2+ VIEW COMPARISON:  None.  FINDINGS: Moderate glenohumeral joint degenerative changes with osteophytic spurring. There are also mild to moderate AC joint degenerative changes. Widened humeroacromial space and slight inferior subluxation of the humeral head could possibly be due to a joint effusion. Bony density near the greater tuberosity likely frayed fleck ting calcific tendinitis. The visualized right ribs are intact.  No worrisome lung lesions. IMPRESSION: Moderate glenohumeral joint degenerative changes. Calcific tendinitis involving the rotator cuff tendons distally near the attachment site on the greater tuberosity. Widened humeroacromial space and slight inferior subluxation of the humeral head could be due to  a joint effusion. Electronically Signed   By: Rudie Meyer M.D.   On: 11/07/2017 16:51   Ct Head Wo Contrast  Result Date: 11/07/2017 CLINICAL DATA:  77 year old with right arm weakness and numbness. Stroke suspected. EXAM: CT HEAD WITHOUT CONTRAST CT CERVICAL SPINE WITHOUT CONTRAST TECHNIQUE: Multidetector CT imaging of the head and cervical spine was performed following the standard protocol without intravenous contrast. Multiplanar CT image reconstructions of the cervical spine were also generated. COMPARISON:  Cervical spine MRI 07/31/2017 FINDINGS: CT HEAD FINDINGS Brain: Mild cerebral atrophy. No evidence for acute hemorrhage, mass lesion, midline shift, hydrocephalus or large infarct. Subtle low-density in the periventricular white matter. Vascular: No hyperdense vessel or unexpected calcification. Skull: Normal. Negative for fracture or focal lesion. Sinuses/Orbits: Small amount of focal disease in the left sphenoid sinus. Other: None. CT CERVICAL SPINE FINDINGS Alignment: Again noted is minimal anterolisthesis at C4-C5. Skull base and vertebrae: No acute fracture. No primary bone lesion or focal pathologic process. Soft tissues and spinal canal: Scattered calcifications throughout the bilateral parotid glands,  right side greater than left. No paravertebral soft tissue abnormality. Thyroid tissue is mildly prominent. Disc levels: Severe multilevel degenerative facet disease. Ankylosis of the right C2-C3 facets. Degenerative changes along the right side of the occiput and C1. Prominent central disc bulge or disc osteophyte complex at C5-C6. Upper chest: Negative. Other: None IMPRESSION: No acute intracranial abnormality. 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 Electronically Signed   By: Richarda Overlie M.D.   On: 11/07/2017 15:52   Ct Cervical Spine Wo Contrast  Result Date: 11/07/2017 CLINICAL DATA:  77 year old with right arm weakness and numbness. Stroke suspected. EXAM: CT HEAD WITHOUT CONTRAST CT CERVICAL SPINE WITHOUT CONTRAST TECHNIQUE: Multidetector CT imaging of the head and cervical spine was performed following the standard protocol without intravenous contrast. Multiplanar CT image reconstructions of the cervical spine were also generated. COMPARISON:  Cervical spine MRI 07/31/2017 FINDINGS: CT HEAD FINDINGS Brain: Mild cerebral atrophy. No evidence for acute hemorrhage, mass lesion, midline shift, hydrocephalus or large infarct. Subtle low-density in the periventricular white matter. Vascular: No hyperdense vessel or unexpected calcification. Skull: Normal. Negative for fracture or focal lesion. Sinuses/Orbits: Small amount of focal disease in the left sphenoid sinus. Other: None. CT CERVICAL SPINE FINDINGS Alignment: Again noted is minimal anterolisthesis at C4-C5. Skull base and vertebrae: No acute fracture. No primary bone lesion or focal pathologic process. Soft tissues and spinal canal: Scattered calcifications throughout the bilateral parotid glands, right side greater than left. No paravertebral soft tissue abnormality. Thyroid tissue is mildly prominent. Disc levels: Severe multilevel degenerative facet disease. Ankylosis of the right C2-C3 facets.  Degenerative changes along the right side of the occiput and C1. Prominent central disc bulge or disc osteophyte complex at C5-C6. Upper chest: Negative. Other: None IMPRESSION: No acute intracranial abnormality. 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 Electronically Signed   By: Richarda Overlie M.D.   On: 11/07/2017 15:52   Mr Brain Wo Contrast  Result Date: 11/08/2017 CLINICAL DATA:  Follow-up suspected stroke. EXAM: MRI HEAD WITHOUT CONTRAST TECHNIQUE: Multiplanar, multiecho pulse sequences of the brain and surrounding structures were obtained without intravenous contrast. COMPARISON:  CT HEAD November 07, 2017 FINDINGS: INTRACRANIAL CONTENTS: No reduced diffusion to suggest acute ischemia. No susceptibility artifact to suggest hemorrhage. The ventricles and sulci are normal for patient's age. Patchy supratentorial and pontine white matter FLAIR T2 hyperintensities. No suspicious parenchymal signal, masses, mass effect. No  abnormal extra-axial fluid collections. Mildly prominent RIGHT posterior fossa and RIGHT greater than LEFT frontal extra-axial spaces. No extra-axial masses. VASCULAR: Normal major intracranial vascular flow voids present at skull base. SKULL AND UPPER CERVICAL SPINE: No abnormal sellar expansion. No suspicious calvarial bone marrow signal. Craniocervical junction maintained. SINUSES/ORBITS: Mild paranasal sinus mucosal thickening without air-fluid levels. Maxillary air cells are well aerated. Included ocular globes and orbital contents are non-suspicious. Status post RIGHT ocular lens implant. OTHER: None. IMPRESSION: 1. No acute intracranial process. 2. Mild-to-moderate chronic small vessel ischemic changes. Electronically Signed   By: Awilda Metro M.D.   On: 11/08/2017 00:41    Medications:   . aspirin EC  81 mg Oral Daily  . brinzolamide  1 drop Both Eyes TID  . carvedilol  25 mg Oral BID WC  . diclofenac sodium  2 g Topical QID  .  doxepin  10 mg Oral QHS  . enoxaparin (LOVENOX) injection  40 mg Subcutaneous Daily  . gabapentin  300 mg Oral BID WC   And  . gabapentin  600 mg Oral QHS  . insulin aspart  0-5 Units Subcutaneous QHS  . insulin aspart  0-9 Units Subcutaneous TID WC  . insulin glargine  20 Units Subcutaneous Daily  . mometasone-formoterol  2 puff Inhalation BID  . montelukast  10 mg Oral QHS  . pantoprazole  40 mg Oral Daily  . pravastatin  40 mg Oral q1800  . predniSONE  10 mg Oral Q breakfast   Continuous Infusions: . cefTRIAXone (ROCEPHIN)  IV Stopped (11/08/17 2300)     LOS: 0 days   Joseph Art  [facility]   *Please refer to [email or web address], password [email or web address] to get updated schedule on who will round on this patient, as hospitalists switch teams weekly. If 7PM-7AM, please contact night-coverage at [email or web address], password [email or web address] for any overnight needs.  11/09/2017, 8:25 AM

## 2017-11-09 NOTE — Clinical Social Work Note (Signed)
Pt now inpatient. Pt needs three night inpatient stay in order for Medicare to cover SNF stay.   [place]akvilleBridget Zelpha Brooks, [place]CSWA [phone]

## 2017-11-10 LAB — GLUCOSE, CAPILLARY
GLUCOSE-CAPILLARY: 227 mg/dL — AB (ref 70–99)
GLUCOSE-CAPILLARY: 197 mg/dL — AB (ref 70–99)
Glucose-Capillary: 221 mg/dL — ABNORMAL HIGH (ref 70–99)
Glucose-Capillary: 260 mg/dL — ABNORMAL HIGH (ref 70–99)

## 2017-11-10 LAB — URINE CULTURE

## 2017-11-10 MED ORDER — INSULIN GLARGINE 100 UNIT/ML ~~LOC~~ SOLN
30.0000 [IU] | Freq: Every day | SUBCUTANEOUS | Status: DC
  Administered 2017-11-11 – 2017-11-12 (×2): 30 [IU] via SUBCUTANEOUS
  Filled 2017-11-10 (×2): qty 0.3

## 2017-11-10 MED ORDER — INSULIN ASPART 100 UNIT/ML ~~LOC~~ SOLN
0.0000 [IU] | Freq: Three times a day (TID) | SUBCUTANEOUS | Status: DC
  Administered 2017-11-10: 11 [IU] via SUBCUTANEOUS
  Administered 2017-11-10: 7 [IU] via SUBCUTANEOUS
  Administered 2017-11-11: 15 [IU] via SUBCUTANEOUS
  Administered 2017-11-11: 7 [IU] via SUBCUTANEOUS
  Administered 2017-11-11 – 2017-11-12 (×3): 4 [IU] via SUBCUTANEOUS

## 2017-11-10 NOTE — Progress Notes (Signed)
PROGRESS NOTE    Kerry Brooks  JDB:[id number] DOB: 1940/06/22 DOA: 11/07/2017 PCP: Reita Cliche, MD    Brief Narrative:  77 year old female with History of lupus and polymyalgia rheumatica, hypertension, DM, asthma,  presenting with severe diffuse muscle and joint pain in conjunction with diffuse weakness. She was initially admitted fro evaluation of stroke, was found to have a negative MRI,. Neurology consulted, and recommendations given.  We are currently treating her for a flare up of her lupus vs PMR.  Assessment & Plan:   Principal Problem:   Acute right-sided weakness Active Problems:   Insulin-requiring or dependent type II diabetes mellitus ([place])   Essential (primary) hypertension   Acute pain of right shoulder   Systemic lupus erythematosus (HCC)   Asthma, mild persistent   Polymyalgia rheumatica (HCC)   Hypokalemia   Elevated troponin    Generalized weakness, with worsening pain and weakness of the upper extremities, right more than left:  Initial CTh ead neg for acute findings.  MRI negative for stroke.  Neurology consulted and recommended increasing the prednisone and treat as PMR flare up. .  Recommend outpatient follow up with rheumatology Dr Hermelinda Medicus MD  We have stopped the statin as per neurology recommendations.  CK is 52, ESR is 12. Urine myoglobin not done.      Insulin dependent DM:  CBG (last 3)  Recent Labs    11/09/17 1627 11/09/17 2145 11/10/17 0657  GLUCAP 385* 351* 221*   Elevated cbg's probably from increase in steroid dose.  Change to resistant scale SSI.  Will need to increase lantus to 30 units daily. A1c is 7.8    Hypertension: well controlled . Resume coreg.    Asthma,no wheezing heard. Resume inhalers as needed.    Elevated troponin. No chest pain. EKG wnl.    Hypokalemia replaced.    UTI:  Resume rocephin. Urine cultures are pending.    Obesity BMI is 35    DVT prophylaxis:  Code Status: full  code.  Family Communication: NONE at bedside.  Disposition Plan: pending clinical improvement, should be ready for discharge in the next 24 hours.    Consultants:   Neurology Dr Cheral Marker.    Procedures: MRI brain is negative.    Antimicrobials: none.    Subjective: Pt reports the pain int he right shoulder and right hand is improving and she is able to move it a little.   Objective: Vitals:   11/10/17 0345 11/10/17 0600 11/10/17 0735 11/10/17 0907  BP: (!) 149/73 (!) 163/71 (!) 156/69   Pulse: 89 87 86   Resp: 18 18 20    Temp: 98 F (36.7 C) 98.4 F (36.9 C) 98.4 F (36.9 C)   TempSrc: Oral Oral Oral   SpO2: 98% (!) 0% 98% 98%  Weight:      Height:        Intake/Output Summary (Last 24 hours) at 11/10/2017 1004 Last data filed at 11/10/2017 0805 Gross per 24 hour  Intake 240 ml  Output 350 ml  Net -110 ml   Filed Weights   11/07/17 1456 11/07/17 2115  Weight: 91.6 kg 91.6 kg    Examination:  General exam: Appears calm and comfortable  Respiratory system: Clear to auscultation. Respiratory effort normal. Cardiovascular system: S1 & S2 heard, RRR. No JVD, murmurs, rubs, gallops or clicks. No pedal edema. Gastrointestinal system: Abdomen is nondistended, soft and nontender. No organomegaly or masses felt. Normal bowel sounds heard. Central nervous system: Alert and oriented.  Right upper extremity strength is improving.  Extremities: Symmetric 5 x 5 power. Skin: No rashes, lesions or ulcers Psychiatry:  Mood & affect appropriate.     Data Reviewed: I have personally reviewed following labs and imaging studies  CBC: Recent Labs  Lab 11/07/17 1519 11/09/17 0734  WBC 6.3 7.0  NEUTROABS 4.2  --   HGB 12.2 11.8*  HCT 37.1 38.6  MCV 91.4 97.0  PLT 122* 237*   Basic Metabolic Panel: Recent Labs  Lab 11/07/17 1519 11/08/17 1045 11/09/17 0734  NA 137  --  134*  K 3.2*  --  4.1  CL 99  --  103  CO2 25  --  20*  GLUCOSE 171*  --  235*  BUN 13  --  18   CREATININE 0.57  --  0.85  CALCIUM 8.7*  --  7.7*  MG  --  1.7  --    GFR: Estimated Creatinine Clearance: 59.6 mL/min (by C-G formula based on SCr of 0.85 mg/dL). Liver Function Tests: Recent Labs  Lab 11/07/17 1519  AST 28  ALT 18  ALKPHOS 66  BILITOT 0.6  PROT 7.0  ALBUMIN 4.0   No results for input(s): LIPASE, AMYLASE in the last 168 hours. No results for input(s): AMMONIA in the last 168 hours. Coagulation Profile: Recent Labs  Lab 11/07/17 1519  INR 1.02   Cardiac Enzymes: Recent Labs  Lab 11/07/17 1519 11/07/17 1835 11/07/17 2253 11/08/17 0736  CKTOTAL  --   --   --  52  TROPONINI 0.05* 0.06* 0.04*  --    BNP (last 3 results) No results for input(s): PROBNP in the last 8760 hours. HbA1C: Recent Labs    11/08/17 0736  HGBA1C 7.8*   CBG: Recent Labs  Lab 11/09/17 0604 11/09/17 1120 11/09/17 1627 11/09/17 2145 11/10/17 0657  GLUCAP 207* 219* 385* 351* 221*   Lipid Profile: Recent Labs    11/08/17 0736  CHOL 158  HDL 61  LDLCALC 82  TRIG 75  CHOLHDL 2.6   Thyroid Function Tests: No results for input(s): TSH, T4TOTAL, FREET4, T3FREE, THYROIDAB in the last 72 hours. Anemia Panel: No results for input(s): VITAMINB12, FOLATE, FERRITIN, TIBC, IRON, RETICCTPCT in the last 72 hours. Sepsis Labs: No results for input(s): PROCALCITON, LATICACIDVEN in the last 168 hours.  No results found for this or any previous visit (from the past 240 hour(s)).       Radiology Studies: No results found.      Scheduled Meds: . aspirin EC  81 mg Oral Daily  . brinzolamide  1 drop Both Eyes TID  . carvedilol  25 mg Oral BID WC  . diclofenac sodium  2 g Topical QID  . doxepin  10 mg Oral QHS  . enoxaparin (LOVENOX) injection  40 mg Subcutaneous Daily  . gabapentin  300 mg Oral BID WC   And  . gabapentin  600 mg Oral QHS  . insulin aspart  0-5 Units Subcutaneous QHS  . insulin aspart  0-9 Units Subcutaneous TID WC  . insulin glargine  26 Units  Subcutaneous Daily  . mometasone-formoterol  2 puff Inhalation BID  . montelukast  10 mg Oral QHS  . pantoprazole  40 mg Oral Daily  . pravastatin  40 mg Oral q1800  . predniSONE  10 mg Oral BID WC   Continuous Infusions: . cefTRIAXone (ROCEPHIN)  IV 1 g (11/09/17 2150)     LOS: 1 day    Time spent: 35 minutes.  Hosie Poisson, MD [facility] Pager [phone]   If 7PM-7AM, please contact night-coverage [email or web address] Password [email or web address] 11/10/2017, 10:04 AM

## 2017-11-10 NOTE — Progress Notes (Signed)
Occupational Therapy Treatment Patient Details Name: Kerry Brooks MRN: [id number] DOB: March 06, 1940 Today's Date: 11/10/2017    History of present illness Patient is a 77 y/o female presenting with R UE pain and weakness. Radiographs of the right shoulder demonstrate degenerative changes of the right glenohumeral joint, calcific tendinitis involving the rotator cuff tendons, and widened humeral acromial space with slight inferior subluxation that could possibly be due to an effusion.  Noncontrast head CT is negative for acute intracranial abnormality and no acute osseous abnormalities were noted on the cervical spine CT. In the ED reports pain and weakness "all over". PMH significant for polymyalgia rheumatica and systemic lupus erythymatosus followed by Rheumatology and managed with Actemra and low-dose prednisone.   OT comments  Pt demonstrating some progress toward OT goals. However, she continues to demonstrate instability on her feet, decreased independence with ADL participation, and generalized weakness. Pt requiring max assist to complete UB dressing tasks due to B shoulder pain and decreased ROM. She requires mod assist +2 for toilet transfers this session and relies on leaning on counter or single UE support to prevent loss of balance at sink. She remains appropriate for SNF level rehabilitation at discharge. Will continue to follow while admitted.    Follow Up Recommendations  SNF    Equipment Recommendations  Other (comment)(defer to next venue of care)    Recommendations for Other Services      Precautions / Restrictions Precautions Precautions: Fall Restrictions Weight Bearing Restrictions: No       Mobility Bed Mobility Overal bed mobility: Needs Assistance Bed Mobility: Supine to Sit     Supine to sit: Min guard     General bed mobility comments: Close guarding assistance with rails down. Significantly increased time.   Transfers Overall transfer level: Needs  assistance Equipment used: Rolling walker (2 wheeled) Transfers: Sit to/from Stand Sit to Stand: Mod assist;+2 physical assistance         General transfer comment: Assist to power up to standing with cues for hand placement.     Balance Overall balance assessment: Needs assistance Sitting-balance support: Feet supported;No upper extremity supported Sitting balance-Leahy Scale: Fair   Postural control: Right lateral lean Standing balance support: During functional activity Standing balance-Leahy Scale: Poor Standing balance comment: Relies on UE support and leaning at sink.                            ADL either performed or assessed with clinical judgement   ADL Overall ADL's : Needs assistance/impaired     Grooming: Minimal assistance;Standing Grooming Details (indicate cue type and reason): posterior lean requiring cues and assistance to correct         Upper Body Dressing : Maximal assistance;Sitting       Toilet Transfer: Moderate assistance;+2 for physical assistance;Ambulation;RW Toilet Transfer Details (indicate cue type and reason): Mod assist +2 to power up to standing but requiring close min guard assist to ambulate to and from sink.          Functional mobility during ADLs: Moderate assistance;+2 for physical assistance;+2 for safety/equipment General ADL Comments: Pt greatly limited by power up to standing but once ambulating is better able to stabilize herself.      Vision       Perception     Praxis      Cognition Arousal/Alertness: Awake/alert Behavior During Therapy: WFL for tasks assessed/performed Overall Cognitive Status: No family/caregiver present to determine baseline cognitive  functioning Area of Impairment: Attention                   Current Attention Level: Selective           General Comments: Pt tangential and requiring cues to remain on topics.         Exercises     Shoulder Instructions        General Comments      Pertinent Vitals/ Pain       Pain Assessment: 0-10 Pain Score: 7  Pain Location: RUE and bil knees Pain Descriptors / Indicators: Aching;Discomfort;Grimacing Pain Intervention(s): Limited activity within patient's tolerance;Monitored during session;Repositioned  Home Living                                          Prior Functioning/Environment              Frequency  Min 2X/week        Progress Toward Goals  OT Goals(current goals can now be found in the care plan section)  Progress towards OT goals: Progressing toward goals  Acute Rehab OT Goals Patient Stated Goal: return home, reduce pain OT Goal Formulation: With patient Time For Goal Achievement: 11/22/17 Potential to Achieve Goals: Good  Plan Discharge plan remains appropriate    Co-evaluation    PT/OT/SLP Co-Evaluation/Treatment: Yes Reason for Co-Treatment: For patient/therapist safety;Other (comment)(for collaborative discharge planning)   OT goals addressed during session: ADL's and self-care      AM-PAC PT "6 Clicks" Daily Activity     Outcome Measure   Help from another person eating meals?: A Little Help from another person taking care of personal grooming?: A Little Help from another person toileting, which includes using toliet, bedpan, or urinal?: A Lot Help from another person bathing (including washing, rinsing, drying)?: A Lot Help from another person to put on and taking off regular upper body clothing?: A Lot Help from another person to put on and taking off regular lower body clothing?: A Lot 6 Click Score: 14    End of Session Equipment Utilized During Treatment: Gait belt;Rolling walker  OT Visit Diagnosis: Unsteadiness on feet (R26.81);Other abnormalities of gait and mobility (R26.89);Pain   Activity Tolerance Patient limited by pain;Patient limited by fatigue;Patient tolerated treatment well   Patient Left in chair;with call bell/phone  within reach;with chair alarm set   Nurse Communication Mobility status        Time: 6962-9528 OT Time Calculation (min): 23 min  Charges: OT General Charges $OT Visit: 1 Visit OT Treatments $Self Care/Home Management : 8-22 mins  Derrell Lolling, OTR/L Acute Rehabilitation Services Pager [phone] Office [phone]    [facility] A Yogesh Cominsky 11/10/2017, 12:55 PM

## 2017-11-10 NOTE — Plan of Care (Signed)

## 2017-11-10 NOTE — Clinical Social Work Note (Signed)
Clinical Social Work Assessment  Patient Details  Name: Kerry Brooks MRN: [id number] Date of Birth: [birth year]  Date of referral:  11/10/17               Reason for consult:  Facility Placement, Discharge Planning                Permission sought to share information with:  Facility Art therapist granted to share information::  Yes, Verbal Permission Granted  Name::        Agency::  SNFs  Relationship::     Contact Information:     Housing/Transportation Living arrangements for the past 2 months:  Single Family Home Source of Information:  Patient Patient Interpreter Needed:  None Criminal Activity/Legal Involvement Pertinent to Current Situation/Hospitalization:  No - Comment as needed Significant Relationships:  Adult Children, Siblings Lives with:  Self Do you feel safe going back to the place where you live?  Yes Need for family participation in patient care:  No (Coment)  Care giving concerns: Patient from home. PT recommending SNF.   Social Worker assessment / plan: CSW met with patient at bedside along with RNCM to discuss disposition planning. PT recommending SNF and patient is agreeable to SNF. Patient does not have support at home. CSW to send out initial referrals and will provide bed offers when available. CSW to follow and support with discharge planning.  Employment status:  Retired Forensic scientist:  [employer] PT Recommendations:  [place] / Referral to community resources:  [place]  Patient/Family's Response to care: Patient appreciative of care.  Patient/Family's Understanding of and Emotional Response to Diagnosis, Current Treatment, and Prognosis: Patient with understanding of conditions and agreeable to SNF.  Emotional Assessment Appearance:  Appears stated age Attitude/Demeanor/Rapport:  Engaged Affect (typically observed):  Accepting, Calm, Appropriate, Pleasant Orientation:   Oriented to Self, Oriented to Place, Oriented to  Time, Oriented to Situation Alcohol / Substance use:  Not Applicable Psych involvement (Current and /or in the community):  No (Comment)  Discharge Needs  Concerns to be addressed:  Discharge Planning Concerns, Care Coordination Readmission within the last 30 days:  No Current discharge risk:  Physical Impairment, Lives alone Barriers to Discharge:  Continued Medical Work up   Estanislado Emms, LCSW 11/10/2017, 12:39 PM

## 2017-11-10 NOTE — Care Management Note (Signed)
Pt from home alone. She has: rollator, walker, wheelchair, and 3 in 1. Pt doesn't have anyone that can provide consistent assistance at home. Her friends assist with transportation. She [place] [facility] [place] and has no issues obtaining meds.  Recommendations are for SNF. [facility] [place]. [facility]. CSW updated. CM following.

## 2017-11-10 NOTE — Progress Notes (Signed)
Inpatient Diabetes Program Recommendations  AACE/ADA: New Consensus Statement on Inpatient Glycemic Control (2015)  Target Ranges:  Prepandial:   less than 140 mg/dL      Peak postprandial:   less than 180 mg/dL (1-2 hours)      Critically ill patients:  140 - 180 mg/dL   Lab Results  Component Value Date   GLUCAP 221 (H) 11/10/2017   HGBA1C 7.8 (H) 11/08/2017    Review of Glycemic ControlResults for Kerry CrankerBARRIER, Kerry Brooks (MRN [id number]) as of 11/10/2017 11:50  Ref. Range 11/09/2017 06:04 11/09/2017 11:20 11/09/2017 16:27 11/09/2017 21:45 11/10/2017 06:57  Glucose-Capillary Latest Ref Range: 70 - 99 mg/dL 409207 (H) 811219 (H) 914385 (H) 351 (H) 221 (H)    Diabetes history: Type 2 DM Outpatient Diabetes medications:  Metformin 500 mg bid, Tresiba 36 units q HS Current orders for Inpatient glycemic control:  Novolog resistant tid with meals and HS, Lantus 30 units daily Inpatient Diabetes Program Recommendations:   May consider increasing Lantus to 36 units daily.  Also please add Novolog meal coverage 4 units tid with meals (hold if patient eats less than 50%).  Thanks,  Beryl MeagerJenny Michaell Grider, RN, BC-ADM Inpatient Diabetes Coordinator Pager [phone] (8a-5p)

## 2017-11-10 NOTE — [place] FL2 (Addendum)
[place] MEDICAID FL2 LEVEL OF CARE SCREENING TOOL     IDENTIFICATION  Patient Name: Kerry Brooks Birthdate: 1940-09-30 Sex: female Admission Date (Current Location): 11/07/2017  [facility] and [place] Number:  Producer, television/film/video and Address:  [facility] [place], [address], [place], [place] [address]      Provider Number: [id number]  Attending Physician Name and Address:  Kathlen Mody, MD  Relative Name and Phone Number:       Current Level of Care: Hospital Recommended Level of Care: Skilled Nursing Facility Prior Approval Number:    Date Approved/Denied:   PASRR Number:   [id number]  Discharge Plan: SNF    Current Diagnoses: Patient Active Problem List   Diagnosis Date Noted  . CVA (cerebral vascular accident) (HCC) 11/09/2017  . Suspected stroke patient last known to be well more than 2 hours ago 11/07/2017  . Acute right-sided weakness 11/07/2017  . Hypokalemia 11/07/2017  . Elevated troponin 11/07/2017  . Neck pain 05/09/2016  . Urinary incontinence 01/02/2016  . Acquired hallux valgus 08/22/2015  . Hammer toe 08/22/2015  . Burning with urination 06/20/2015  . Other long term (current) drug therapy 06/20/2015  . Gonalgia 06/19/2015  . Allergic rhinitis 02/15/2015  . Cervical osteoarthritis 02/15/2015  . Acid reflux 02/15/2015  . BP (high blood pressure) 02/15/2015  . LBP (low back pain) 02/15/2015  . Microalbuminuria 02/15/2015  . Diabetic retinopathy associated with type 2 diabetes mellitus (HCC) 02/15/2015  . Asthma, mild persistent 02/15/2015  . 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 B12 deficiency 02/15/2015  . Diabetes mellitus (HCC) 12/16/2014  . Bilateral low back pain with bilateral sciatica 12/16/2014  . Insulin-requiring or dependent type II diabetes mellitus (HCC) 05/17/2014  . Lower back pain 05/17/2014  . Sciatica 05/17/2014  . Lumbar vertebral fracture (HCC) 05/17/2014  . Acute pain of right shoulder  05/17/2014  . Insomnia 05/17/2014  . Fracture of lumbar vertebra (HCC) 05/17/2014  . Airway hyperreactivity 08/01/2012  . Bunion 08/01/2012  . Cataract 08/01/2012  . Diaphragmatic hernia 08/01/2012  . D (diarrhea) 08/01/2012  . Essential (primary) hypertension 08/01/2012  . Glaucoma 08/01/2012  . HLD (hyperlipidemia) 08/01/2012  . 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 N&V (nausea and vomiting) 08/01/2012  . Arthralgia of lower leg 08/01/2012  . Arthralgia of hip or thigh 08/01/2012  . Paroxysmal digital cyanosis 08/01/2012  . Diabetes mellitus, type 2 (HCC) 08/01/2012  . FOM (frequency of micturition) 08/01/2012  . Emesis 08/01/2012  . Systemic lupus erythematosus (HCC) 08/01/2012  . Type 2 diabetes mellitus (HCC) 08/01/2012    Orientation RESPIRATION BLADDER Height & Weight     Self, Time, Situation, Place  Normal Incontinent Weight: 91.6 kg Height:  5\' 3"  (160 cm)  BEHAVIORAL SYMPTOMS/MOOD NEUROLOGICAL BOWEL NUTRITION STATUS      Continent Diet(heart healthy/ carb modified)  AMBULATORY STATUS COMMUNICATION OF NEEDS Skin   Limited Assist Verbally Normal                       Personal Care Assistance Level of Assistance  Bathing, Feeding, Dressing Bathing Assistance: Maximum assistance Feeding assistance: Independent Dressing Assistance: Limited assistance     Functional Limitations Info  Sight, Hearing, Speech Sight Info: Adequate Hearing Info: Adequate Speech Info: Adequate    SPECIAL CARE FACTORS FREQUENCY  PT (By licensed PT), OT (By licensed OT)     PT Frequency: 5x/wk OT Frequency: 5x/wk  Contractures Contractures Info: Not present    Additional Factors Info  Code Status, Allergies, Psychotropic, Insulin Sliding Scale Code Status Info: full Allergies Info: CANAGLIFLOZIN, PROPOXYPHENE  Psychotropic Info: Neurontin 300 mg BID/ Neurontin 600 mg QHS Insulin Sliding Scale Info: Novolog 0-20 units SQ TID with meals/  Novolog 0-5 units SQ QHS---Lantus 30 units SQ daily       Current Medications (11/10/2017):  This is the current hospital active medication list Current Facility-Administered Medications  Medication Dose Route Frequency Provider Last Rate Last Dose  . acetaminophen (TYLENOL) tablet 650 mg  650 mg Oral Q4H PRN Opyd, Lavone Neriimothy S, MD       Or  . acetaminophen (TYLENOL) solution 650 mg  650 mg Per Tube Q4H PRN Opyd, Lavone Neriimothy S, MD       Or  . acetaminophen (TYLENOL) suppository 650 mg  650 mg Rectal Q4H PRN Opyd, Lavone Neriimothy S, MD      . albuterol (PROVENTIL) (2.5 MG/3ML) 0.083% nebulizer solution 2.5 mg  2.5 mg Nebulization Q6H PRN Opyd, Lavone Neriimothy S, MD      . aspirin EC tablet 81 mg  81 mg Oral Daily Opyd, Lavone Neriimothy S, MD   81 mg at 11/10/17 0800  . brinzolamide (AZOPT) 1 % ophthalmic suspension 1 drop  1 drop Both Eyes TID Opyd, Lavone Neriimothy S, MD   1 drop at 11/10/17 0801  . carvedilol (COREG) tablet 25 mg  25 mg Oral BID WC Vann, Jessica U, DO   25 mg at 11/10/17 0800  . cefTRIAXone (ROCEPHIN) 1 g in sodium chloride 0.9 % 100 mL IVPB  1 g Intravenous QHS Opyd, Lavone Neriimothy S, MD 200 mL/hr at 11/09/17 2150 1 g at 11/09/17 2150  . cyclobenzaprine (FLEXERIL) tablet 5 mg  5 mg Oral TID PRN Opyd, Lavone Neriimothy S, MD      . diclofenac sodium (VOLTAREN) 1 % transdermal gel 2 g  2 g Topical QID Vann, Jessica U, DO   2 g at 11/10/17 0801  . doxepin (SINEQUAN) capsule 10 mg  10 mg Oral QHS Opyd, Lavone Neriimothy S, MD   10 mg at 11/09/17 2142  . enoxaparin (LOVENOX) injection 40 mg  40 mg Subcutaneous Daily Opyd, Lavone Neriimothy S, MD   40 mg at 11/10/17 0800  . gabapentin (NEURONTIN) capsule 300 mg  300 mg Oral BID WC Opyd, Lavone Neriimothy S, MD   300 mg at 11/10/17 0800   And  . gabapentin (NEURONTIN) capsule 600 mg  600 mg Oral QHS Opyd, Lavone Neriimothy S, MD   600 mg at 11/09/17 2142  . HYDROcodone-acetaminophen (NORCO) 7.5-325 MG per tablet 1 tablet  1 tablet Oral Q6H PRN Opyd, Lavone Neriimothy S, MD   1 tablet at 11/10/17 0445  . insulin aspart (novoLOG)  injection 0-20 Units  0-20 Units Subcutaneous TID WC Kathlen ModyAkula, Vijaya, MD      . insulin aspart (novoLOG) injection 0-5 Units  0-5 Units Subcutaneous QHS Opyd, Lavone Neriimothy S, MD   5 Units at 11/09/17 2159  . [START ON 11/11/2017] insulin glargine (LANTUS) injection 30 Units  30 Units Subcutaneous Daily Kathlen ModyAkula, Vijaya, MD      . magnesium hydroxide (MILK OF MAGNESIA) suspension 5 mL  5 mL Oral Daily PRN Marlin CanaryVann, Jessica U, DO   5 mL at 11/09/17 0849  . mometasone-formoterol (DULERA) 200-5 MCG/ACT inhaler 2 puff  2 puff Inhalation BID Opyd, Lavone Neriimothy S, MD   2 puff at 11/10/17 0906  . montelukast (SINGULAIR) tablet 10 mg  10 mg Oral QHS Opyd, Lavone Neriimothy S, MD  10 mg at 11/09/17 2142  . pantoprazole (PROTONIX) EC tablet 40 mg  40 mg Oral Daily Opyd, Lavone Neri, MD   40 mg at 11/10/17 0800  . predniSONE (DELTASONE) tablet 10 mg  10 mg Oral BID WC Vann, Jessica U, DO   10 mg at 11/10/17 0800  . senna-docusate (Senokot-S) tablet 1 tablet  1 tablet Oral QHS PRN Opyd, Lavone Neri, MD         Discharge Medications: Please see discharge summary for a list of discharge medications.  Relevant Imaging Results:  Relevant Lab Results:   Additional Information SS#: [id number]  Kermit Balo, RN

## 2017-11-10 NOTE — Progress Notes (Signed)
Physical Therapy Treatment Patient Details Name: Kerry Brooks MRN: [id number] DOB: 05-11-1940 Today's Date: 11/10/2017    History of Present Illness Patient is a 77 y/o female presenting with R UE pain and weakness. Radiographs of the right shoulder demonstrate degenerative changes of the right glenohumeral joint, calcific tendinitis involving the rotator cuff tendons, and widened humeral acromial space with slight inferior subluxation that could possibly be due to an effusion.  Noncontrast head CT is negative for acute intracranial abnormality and no acute osseous abnormalities were noted on the cervical spine CT. In the ED reports pain and weakness "all over". PMH significant for polymyalgia rheumatica and systemic lupus erythymatosus followed by Rheumatology and managed with Actemra and low-dose prednisone.    PT Comments    Patient progressing slowly towards PT goals. Improved ambulation distance with close min guard due to weakness and imbalance. Not able to stand without mod A of 2 due to bil knee pain and weakness. Pt lives alone and not safe to be home alone at this time. Difficulty donning gown and standing from all surfaces. High fall risk. Would benefit from SNF to maximize independence and mobility. Will follow.    Follow Up Recommendations  SNF     Equipment Recommendations  None recommended by PT    Recommendations for Other Services       Precautions / Restrictions Precautions Precautions: Fall Restrictions Weight Bearing Restrictions: No    Mobility  Bed Mobility Overal bed mobility: Needs Assistance Bed Mobility: Supine to Sit     Supine to sit: Min guard     General bed mobility comments: HOB flat to simulate home and rails down; able to get to EOB with increased difficulty and time.  Transfers Overall transfer level: Needs assistance Equipment used: Rolling walker (2 wheeled) Transfers: Sit to/from Stand Sit to Stand: Mod assist;+2 physical  assistance         General transfer comment: Assist to power to standing with cues for hand placement/technique. Bil knee instability and difficulty in mid range. Stood from [employer]stateEOB x1. transferred to chair post ambulation.  Ambulation/Gait Ambulation/Gait assistance: Min guard Gait Distance (Feet): 15 Feet(+20') Assistive device: Rolling walker (2 wheeled) Gait Pattern/deviations: Step-through pattern;Step-to pattern;Decreased stride length;Narrow base of support;Trunk flexed Gait velocity: decreased   General Gait Details: Slow, unsteady gait with bil knee instability. increased WB through BUEs.    Stairs             Wheelchair Mobility    Modified Rankin (Stroke Patients Only)       Balance Overall balance assessment: Needs assistance Sitting-balance support: Feet supported;No upper extremity supported Sitting balance-Leahy Scale: Fair   Postural control: Right lateral lean Standing balance support: During functional activity Standing balance-Leahy Scale: Poor Standing balance comment: Requires at least 1 UE support in standing due to weakness. Performed ADL task at sink with close Min guard due to right lateral lean.                             Cognition Arousal/Alertness: Awake/alert Behavior During Therapy: WFL for tasks assessed/performed Overall Cognitive Status: Within Functional Limits for tasks assessed                                 General Comments: Tangential in speech.      Exercises      General Comments  Pertinent Vitals/Pain Pain Assessment: 0-10 Pain Score: 7  Pain Location: RUE and bil knees Pain Descriptors / Indicators: Aching;Discomfort;Grimacing Pain Intervention(s): Monitored during session;Repositioned    Home Living                      Prior Function            PT Goals (current goals can now be found in the care plan section) Progress towards PT goals: Progressing toward  goals    Frequency    Min 2X/week      PT Plan Current plan remains appropriate    Co-evaluation PT/OT/SLP Co-Evaluation/Treatment: Yes Reason for Co-Treatment: For patient/therapist safety          AM-PAC PT "6 Clicks" Daily Activity  Outcome Measure  Difficulty turning over in bed (including adjusting bedclothes, sheets and blankets)?: None Difficulty moving from lying on back to sitting on the side of the bed? : A Little Difficulty sitting down on and standing up from a chair with arms (e.g., wheelchair, bedside commode, etc,.)?: Unable Help needed moving to and from a bed to chair (including a wheelchair)?: A Little Help needed walking in hospital room?: A Little Help needed climbing 3-5 steps with a railing? : Total 6 Click Score: 15    End of Session Equipment Utilized During Treatment: Gait belt Activity Tolerance: Patient tolerated treatment well Patient left: in chair;with call bell/phone within reach;with chair alarm set Nurse Communication: Mobility status PT Visit Diagnosis: Unsteadiness on feet (R26.81);Other abnormalities of gait and mobility (R26.89);Muscle weakness (generalized) (M62.81)     Time: 6962-9528 PT Time Calculation (min) (ACUTE ONLY): 23 min  Charges:  $Gait Training: 8-22 mins                     Mylo Red, PT, DPT Acute Rehabilitation Services Pager [phone] Office [phone]       Blake Divine A Lanier Ensign 11/10/2017, 11:51 AM

## 2017-11-11 LAB — GLUCOSE, CAPILLARY
GLUCOSE-CAPILLARY: 188 mg/dL — AB (ref 70–99)
GLUCOSE-CAPILLARY: 175 mg/dL — AB (ref 70–99)
Glucose-Capillary: 202 mg/dL — ABNORMAL HIGH (ref 70–99)
Glucose-Capillary: 340 mg/dL — ABNORMAL HIGH (ref 70–99)

## 2017-11-11 MED ORDER — CEPHALEXIN 500 MG PO CAPS
500.0000 mg | ORAL_CAPSULE | Freq: Two times a day (BID) | ORAL | Status: DC
  Administered 2017-11-11 – 2017-11-12 (×3): 500 mg via ORAL
  Filled 2017-11-11 (×3): qty 1

## 2017-11-11 NOTE — Progress Notes (Signed)
PROGRESS NOTE    Kerry Brooks  QVZ:[id number] DOB: 06-Sep-1940 DOA: 11/07/2017 PCP: Reita Cliche, MD    Brief Narrative:  77 year old female with History of lupus and polymyalgia rheumatica, hypertension, DM, asthma,  presenting with severe diffuse muscle and joint pain in conjunction with diffuse weakness. She was initially admitted fro evaluation of stroke, was found to have a negative MRI,. Neurology consulted, and recommendations given.  We are currently treating her for a flare up of her lupus vs PMR.  Assessment & Plan:   Principal Problem:   Acute right-sided weakness Active Problems:   Insulin-requiring or dependent type II diabetes mellitus ([place])   Essential (primary) hypertension   Acute pain of right shoulder   Systemic lupus erythematosus (HCC)   Asthma, mild persistent   Polymyalgia rheumatica (HCC)   Hypokalemia   Elevated troponin    Generalized weakness, with worsening pain and weakness of the upper extremities, right more than left:  Initial CT head neg for acute findings.  MRI negative for stroke.  Neurology consulted and recommended increasing the prednisone and treat as PMR flare up. .  Recommend outpatient follow up with rheumatology Dr Hermelinda Medicus MD  We have stopped the statin as per neurology recommendations.  CK is 52, ESR is 12. Urine myoglobin not done.  She is currently on 10 mg of prednisone BID. She reports her strength in the left arm and shoulder improving.  The pain is tolerable.      Insulin dependent DM:  CBG (last 3)  Recent Labs    11/10/17 1609 11/10/17 2200 11/11/17 0634  GLUCAP 227* 197* 188*   Elevated cbg's probably from increase in steroid dose.  Change to resistant scale SSI.  Will need to increase lantus to 30 units daily. A1c is 7.8  Resume the same dose of the lantus and SSI.    Hypertension: well controlled . Resume coreg.    Asthma : no wheezing heard. Resume inhalers as needed.    Elevated  troponin. No chest pain. EKG wnl.    Hypokalemia replaced.    UTI:  Urine cultures show multiple bacteria. Change to keflex to complete the course.     Obesity BMI is 35  Mild hyponatremia. Asymptomatic.   DVT prophylaxis:Lovenox.  Code Status: full code.  Family Communication: None at bedside.  Disposition Plan: d/c to SNF when bed available.    Consultants:   Neurology Dr Cheral Marker.    Procedures: MRI brain is negative.    Antimicrobials: keflex.    Subjective: Pt reports the pain in the right shoulder and right hand is improving and she is able to move it a little.   Objective: Vitals:   11/11/17 0415 11/11/17 0800 11/11/17 0806 11/11/17 1130  BP: (!) 146/80 (!) 147/79  (!) 143/67  Pulse: 92 94  90  Resp: 19 18  16   Temp: 98.1 F (36.7 C) 98.1 F (36.7 C)    TempSrc: Oral Oral    SpO2: 100% 100% 99% 99%  Weight:      Height:        Intake/Output Summary (Last 24 hours) at 11/11/2017 1149 Last data filed at 11/11/2017 1000 Gross per 24 hour  Intake 1420 ml  Output 1900 ml  Net -480 ml   Filed Weights   11/07/17 1456 11/07/17 2115  Weight: 91.6 kg 91.6 kg    Examination:  General exam: Appears calm and comfortable, not in distress  Respiratory system: Clear to auscultation. Respiratory effort normal.  No wheezing or rhonchi.  Cardiovascular system: S1 & S2 heard, RRR. No JVD, murmurs .  Gastrointestinal system: Abdomen is soft NT ND BS+ Central nervous system: Alert and oriented. Right upper extremity strength is improving. 4/5 Strength of the RUE.  Extremities: No pedal edema.  Skin: No rashes, lesions or ulcers Psychiatry:  Mood & affect appropriate.     Data Reviewed: I have personally reviewed following labs and imaging studies  CBC: Recent Labs  Lab 11/07/17 1519 11/09/17 0734  WBC 6.3 7.0  NEUTROABS 4.2  --   HGB 12.2 11.8*  HCT 37.1 38.6  MCV 91.4 97.0  PLT 122* 161*   Basic Metabolic Panel: Recent Labs  Lab 11/07/17 1519  11/08/17 1045 11/09/17 0734  NA 137  --  134*  K 3.2*  --  4.1  CL 99  --  103  CO2 25  --  20*  GLUCOSE 171*  --  235*  BUN 13  --  18  CREATININE 0.57  --  0.85  CALCIUM 8.7*  --  7.7*  MG  --  1.7  --    GFR: Estimated Creatinine Clearance: 59.6 mL/min (by C-G formula based on SCr of 0.85 mg/dL). Liver Function Tests: Recent Labs  Lab 11/07/17 1519  AST 28  ALT 18  ALKPHOS 66  BILITOT 0.6  PROT 7.0  ALBUMIN 4.0   No results for input(s): LIPASE, AMYLASE in the last 168 hours. No results for input(s): AMMONIA in the last 168 hours. Coagulation Profile: Recent Labs  Lab 11/07/17 1519  INR 1.02   Cardiac Enzymes: Recent Labs  Lab 11/07/17 1519 11/07/17 1835 11/07/17 2253 11/08/17 0736  CKTOTAL  --   --   --  52  TROPONINI 0.05* 0.06* 0.04*  --    BNP (last 3 results) No results for input(s): PROBNP in the last 8760 hours. HbA1C: No results for input(s): HGBA1C in the last 72 hours. CBG: Recent Labs  Lab 11/10/17 0657 11/10/17 1128 11/10/17 1609 11/10/17 2200 11/11/17 0634  GLUCAP 221* 260* 227* 197* 188*   Lipid Profile: No results for input(s): CHOL, HDL, LDLCALC, TRIG, CHOLHDL, LDLDIRECT in the last 72 hours. Thyroid Function Tests: No results for input(s): TSH, T4TOTAL, FREET4, T3FREE, THYROIDAB in the last 72 hours. Anemia Panel: No results for input(s): VITAMINB12, FOLATE, FERRITIN, TIBC, IRON, RETICCTPCT in the last 72 hours. Sepsis Labs: No results for input(s): PROCALCITON, LATICACIDVEN in the last 168 hours.  Recent Results (from the past 240 hour(s))  Urine Culture     Status: Abnormal   Collection Time: 11/07/17 10:44 PM  Result Value Ref Range Status   Specimen Description URINE, RANDOM  Final   Special Requests   Final    NONE Performed at [facility], [address], [place], [place] [address]    Culture MULTIPLE SPECIES PRESENT, SUGGEST RECOLLECTION (A)  Final   Report Status 11/10/2017 FINAL  Final          Radiology Studies: No results found.      Scheduled Meds: . aspirin EC  81 mg Oral Daily  . brinzolamide  1 drop Both Eyes TID  . carvedilol  25 mg Oral BID WC  . diclofenac sodium  2 g Topical QID  . doxepin  10 mg Oral QHS  . enoxaparin (LOVENOX) injection  40 mg Subcutaneous Daily  . gabapentin  300 mg Oral BID WC   And  . gabapentin  600 mg Oral QHS  . insulin  aspart  0-20 Units Subcutaneous TID WC  . insulin aspart  0-5 Units Subcutaneous QHS  . insulin glargine  30 Units Subcutaneous Daily  . mometasone-formoterol  2 puff Inhalation BID  . montelukast  10 mg Oral QHS  . pantoprazole  40 mg Oral Daily  . predniSONE  10 mg Oral BID WC   Continuous Infusions: . cefTRIAXone (ROCEPHIN)  IV 1 g (11/10/17 2209)     LOS: 2 days    Time spent: 35 minutes.     Hosie Poisson, MD [facility] Pager [phone]   If 7PM-7AM, please contact night-coverage [email or web address] Password [facility] 11/11/2017, 11:49 AM

## 2017-11-12 LAB — GLUCOSE, CAPILLARY
GLUCOSE-CAPILLARY: 191 mg/dL — AB (ref 70–99)
Glucose-Capillary: 184 mg/dL — ABNORMAL HIGH (ref 70–99)

## 2017-11-12 MED ORDER — HYDROCODONE-ACETAMINOPHEN 7.5-325 MG PO TABS: 1.0000 | ORAL_TABLET | Freq: Three times a day (TID) | ORAL | 0 refills | Status: DC | PRN

## 2017-11-12 MED ORDER — CYCLOBENZAPRINE HCL 5 MG PO TABS: 5.0000 mg | ORAL_TABLET | Freq: Three times a day (TID) | ORAL | 0 refills | Status: DC | PRN

## 2017-11-12 MED ORDER — PREDNISONE 5 MG PO TABS: 10.0000 mg | ORAL_TABLET | Freq: Two times a day (BID) | ORAL | Status: DC

## 2017-11-12 MED ORDER — INSULIN DEGLUDEC 100 UNIT/ML ~~LOC~~ SOLN: 40.0000 [IU] | Freq: Every day | SUBCUTANEOUS | Status: AC

## 2017-11-12 NOTE — Progress Notes (Signed)
Inpatient Diabetes Program Recommendations  AACE/ADA: New Consensus Statement on Inpatient Glycemic Control (2015)  Target Ranges:  Prepandial:   less than 140 mg/dL      Peak postprandial:   less than 180 mg/dL (1-2 hours)      Critically ill patients:  140 - 180 mg/dL   Lab Results  Component Value Date   GLUCAP 184 (H) 11/12/2017   HGBA1C 7.8 (H) 11/08/2017    Review of Glycemic ControlResults for Lamount CrankerBARRIER, Kerry R (MRN [id number]) as of 11/12/2017 10:51  Ref. Range 11/11/2017 06:34 11/11/2017 11:33 11/11/2017 16:46 11/11/2017 21:23 11/12/2017 06:21  Glucose-Capillary Latest Ref Range: 70 - 99 mg/dL 409188 (H) 811202 (H) 914340 (H) 175 (H) 184 (H)   Diabetes history: Type 2 DM Outpatient Diabetes medications:  Metformin 500 mg bid, Tresiba 36 units q HS Current orders for Inpatient glycemic control:  Novolog resistant tid with meals and HS, Lantus 30 units daily Prednisone 10 mg bid Inpatient Diabetes Program Recommendations:    Consider adding Novolog meal coverage 3 units tid with meals (hold if patient eats less than 50%).   Thanks,  Beryl MeagerJenny Jermey Closs, RN, BC-ADM Inpatient Diabetes Coordinator Pager [phone] (8a-5p)

## 2017-11-12 NOTE — Clinical Social Work Note (Signed)
CSW left voicemail for SNF admissions coordinator to notify her that discharge summary is in. Asked her to call back and let me know when transport can be arranged.  Charlynn CourtSarah Samantha Ragen, CSW [phone]

## 2017-11-12 NOTE — Care Management Important Message (Signed)
Important Message  Patient Details  Name: Kerry Brooks MRN: [id number] Date of Birth: 10-02-1940   Medicare Important Message Given:  Yes    Dorena BodoIris Zykia Walla 11/12/2017, 3:15 PM

## 2017-11-12 NOTE — Progress Notes (Signed)
[facility], discharge instructions reviewed with receiving nurse and all questions were answered.  PIV removed per policy.

## 2017-11-12 NOTE — Clinical Social Work Note (Signed)
CSW facilitated patient discharge including contacting patient family and facility to confirm patient discharge plans. Clinical information faxed to facility and family agreeable with plan. CSW arranged [facility]. RN to call report prior to discharge [phone] Room [phone]).  CSW will sign off for now as social work intervention is no longer needed. Please consult us again if new needs arise.  Charlynn CourtSarah Aaminah Forrester, CSW [phone]

## 2017-11-12 NOTE — Discharge Summary (Signed)
Physician Discharge Summary  Kerry Brooks YQI:[id number] DOB: Dec 12, 1940 DOA: 11/07/2017  PCP: Kerry Cliche, MD  Admit date: 11/07/2017 Discharge date: 11/12/2017  Admitted From: Home Disposition: SNF   Recommendations for Outpatient Follow-up:  1. Follow up with PCP in 1-2 weeks 2. Please taper prednisone based on clinical response, discharging on prednisone 4m po BID (previously 524mdaily). 3. Titrate insulin based on CBGs, was on lantus 36u daily, upped to 40u daily due to steroid-induced hyperglycemia. Consider SSI. 4. Follow up with rheumatology in the next 2 weeks 5. Please obtain BMP/CBC in one week  Home Health: N/A Equipment/Devices: Per SNF Discharge Condition: Stable CODE STATUS: Full Diet recommendation: Heart healthy, carb-modified  Brief/Interim Summary: Kerry Brooks a [age]ear old female with History of lupus and polymyalgia rheumatica, hypertension, IDDM, and asthma who presented with severe diffuse muscle and joint pain in conjunction with diffuse weakness. She was initially admitted for evaluation of stroke, was found to have a negative MRI. Neurology consulted, and recommendations given. Steroids were started with improvement in symptoms. Physical therapy evaluation has recommended SNF for short term rehabilitation prior to returning home.   Discharge Diagnoses:  Principal Problem:   Acute right-sided weakness Active Problems:   Insulin-requiring or dependent type II diabetes mellitus ([place]  Essential (primary) hypertension   Acute pain of right shoulder   Systemic lupus erythematosus (HCC)   Asthma, mild persistent   Polymyalgia rheumatica (HCC)   Suspected stroke patient last known to be well more than 2 hours ago   Hypokalemia   Elevated troponin   CVA (cerebral vascular accident) ([facility] Generalized weakness, with worsening pain and weakness of the upper extremities, right more than left: Suspect flare of lupus and/or PMR which is resonding  to sterids. Initial CT head neg for acute findings. MRI negative for stroke. Neurology consulted and recommended increasing the prednisone and treat as PMR flare up. CK is 52, ESR is 12. Urine myoglobin not done.  - Recommend outpatient follow up with rheumatology Kerry Brooks  - We have stopped the statin as per neurology recommendations. - She is currently on 10 mg of prednisone BID. She reports her strength in the left arm and shoulder improving.   Insulin dependent DM: Followed by endocrinology. Having steroid-induced hyperglycemia.  - Augment insulin to 40 units daily of lantus and if hyperglycemia persists, recommend initiation of sliding scale insulin.   Hypertension: well controlled - Resume coreg.   Asthma : no wheezing heard.  - Resume home medications  Elevated troponin: No chest pain. EKG wnl. Defer further management.  Hypokalemia: Replaced.   UTI, recurrent: Urine cultures show multiple bacteria.  - Has completed 6 days of Tx. Has no symptoms. Continue suppressive trimethoprim.  Obesity BMI is 35  Mild hyponatremia. Asymptomatic.  - Monitor  GERD:  - PPI  Discharge Instructions Discharge Instructions    Diet - low sodium heart healthy   Complete by:  As directed    Diet Carb Modified   Complete by:  As directed    Increase activity slowly   Complete by:  As directed      Allergies as of 11/12/2017      Reactions   Canagliflozin Other (See Comments)   Other reaction(s): Other (See Comments) unknown unknown   Propoxyphene Other (See Comments)   unknown      Medication List    STOP taking these medications   methylPREDNISolone acetate 40 MG/ML injection Commonly known as:  DEPO-MEDROL  rosuvastatin 20 MG tablet Commonly known as:  CRESTOR     TAKE these medications   ACTEMRA IV Inject into the vein every 30 (thirty) days.   alendronate 70 MG tablet Commonly known as:  FOSAMAX Take 70 mg by mouth once a week.   amLODipine  5 MG tablet Commonly known as:  NORVASC Take 5 mg by mouth.   aspirin EC 81 MG tablet Take 81 mg by mouth daily.   brinzolamide 1 % ophthalmic suspension Commonly known as:  AZOPT Place 1 drop into both eyes 2 (two) times daily. Frequency:   Dosage:0.0     Instructions:  Note:   carvedilol 25 MG tablet Commonly known as:  COREG Take 25 mg by mouth 2 (two) times daily with a meal.   cefUROXime 500 MG tablet Commonly known as:  CEFTIN Take 500 mg by mouth 2 (two) times daily.   cyclobenzaprine 5 MG tablet Commonly known as:  FLEXERIL Take 1 tablet (5 mg total) by mouth 3 (three) times daily as needed. Take 5 mg at bedtime nightly and can take up to 3 a day as needed What changed:    how much to take  how to take this  when to take this  reasons to take this  additional instructions   diclofenac sodium 1 % Gel Commonly known as:  VOLTAREN Apply 2 g 3 (three) times daily topically. Frequency:   Dosage:0.0     Instructions:  Note: What changed:    when to take this  reasons to take this   doxepin 10 MG capsule Commonly known as:  SINEQUAN Take 1 capsule (10 mg total) by mouth at bedtime. What changed:    when to take this  reasons to take this   fluticasone 50 MCG/ACT nasal spray Commonly known as:  FLONASE Place 1 spray into both nostrils daily. Frequency:   Dosage:0.0     Instructions:  Note:   gabapentin 300 MG capsule Commonly known as:  NEURONTIN TAKE 1 CAPSULE IN THE MORNING, TAKE 1 CAPSULE IN THE EVENING, AND TAKE 2 CAPSULES AT NIGHT What changed:  See the new instructions.   HYDROcodone-acetaminophen 7.5-325 MG tablet Commonly known as:  NORCO Take 1 tablet by mouth 3 (three) times daily as needed. What changed:    when to take this  reasons to take this   Insulin Degludec 100 UNIT/ML Soln Inject 40 Units into the skin at bedtime. What changed:  how much to take   losartan-hydrochlorothiazide 100-12.5 MG tablet Commonly known as:   HYZAAR Take 1 tablet by mouth daily.   metFORMIN 500 MG tablet Commonly known as:  GLUCOPHAGE Take 500 mg by mouth 2 (two) times daily with a meal.   montelukast 10 MG tablet Commonly known as:  SINGULAIR Take 10 mg by mouth at bedtime.   ONE TOUCH ULTRA TEST test strip Generic drug:  glucose blood Use 1 test strip to check blood sugar twice daily   pantoprazole 40 MG tablet Commonly known as:  PROTONIX Take 40 mg by mouth 2 (two) times daily before a meal.   potassium chloride 10 MEQ tablet Commonly known as:  K-DUR Take 10 mEq by mouth daily.   predniSONE 5 MG tablet Commonly known as:  DELTASONE Take 2 tablets (10 mg total) by mouth 2 (two) times daily with a meal. What changed:    how much to take  when to take this   trimethoprim 100 MG tablet Commonly known as:  TRIMPEX Take  100 mg by mouth at bedtime.   VENTOLIN HFA 108 (90 Base) MCG/ACT inhaler Generic drug:  albuterol Inhale 1 puff into the lungs every 4 (four) hours as needed.   Vitamin B-12 CR 1000 MCG Tbcr Take 1,000 mcg by mouth daily. Frequency:   Dosage:0.0     Instructions:  Note:   Vitamin D2 2000 units Tabs Take 2,000 Units by mouth daily. Frequency:   Dosage:0     Instructions:  Note:take 5000 iu everyday       Contact information for follow-up providers    Kerry Cliche, MD. Schedule an appointment as soon as possible for a visit.   Specialty:  Internal Medicine       Hermelinda Medicus, MD. Schedule an appointment as soon as possible for a visit in 2 week(s).   Specialty:  Internal Medicine Contact information: [address] [place] [address] [phone]            Contact information for after-discharge care    Destination    [facility] .   Service:  Skilled Nursing Contact information: [address] [place] [address] [phone]                 Allergies  Allergen Reactions  . Canagliflozin  Other (See Comments)    Other reaction(s): Other (See Comments) unknown unknown  . Propoxyphene Other (See Comments)    unknown    Consultations:  Neurology  Procedures/Studies: Dg Shoulder Right  Result Date: 11/07/2017 CLINICAL DATA:  Acute onset of right shoulder pain this morning. No known injury. EXAM: RIGHT SHOULDER - 2+ VIEW COMPARISON:  None. FINDINGS: Moderate glenohumeral joint degenerative changes with osteophytic spurring. There are also mild to moderate AC joint degenerative changes. Widened humeroacromial space and slight inferior subluxation of the humeral head could possibly be due to a joint effusion. Bony density near the greater tuberosity likely frayed fleck ting calcific tendinitis. The visualized right ribs are intact.  No worrisome lung lesions. IMPRESSION: Moderate glenohumeral joint degenerative changes. Calcific tendinitis involving the rotator cuff tendons distally near the attachment site on the greater tuberosity. Widened humeroacromial space and slight inferior subluxation of the humeral head could be due to a joint effusion. Electronically Signed   By: Marijo Sanes M.D.   On: 11/07/2017 16:51   Ct Head Wo Contrast  Result Date: 11/07/2017 CLINICAL DATA:  77 year old with right arm weakness and numbness. Stroke suspected. EXAM: CT HEAD WITHOUT CONTRAST CT CERVICAL SPINE WITHOUT CONTRAST TECHNIQUE: Multidetector CT imaging of the head and cervical spine was performed following the standard protocol without intravenous contrast. Multiplanar CT image reconstructions of the cervical spine were also generated. COMPARISON:  Cervical spine MRI 07/31/2017 FINDINGS: CT HEAD FINDINGS Brain: Mild cerebral atrophy. No evidence for acute hemorrhage, mass lesion, midline shift, hydrocephalus or large infarct. Subtle low-density in the periventricular white matter. Vascular: No hyperdense vessel or unexpected calcification. Skull: Normal. Negative for fracture or focal lesion.  Sinuses/Orbits: Small amount of focal disease in the left sphenoid sinus. Other: None. CT CERVICAL SPINE FINDINGS Alignment: Again noted is minimal anterolisthesis at C4-C5. Skull base and vertebrae: No acute fracture. No primary bone lesion or focal pathologic process. Soft tissues and spinal canal: Scattered calcifications throughout the bilateral parotid glands, right side greater than left. No paravertebral soft tissue abnormality. Thyroid tissue is mildly prominent. Disc levels: Severe multilevel degenerative facet disease. Ankylosis of the right C2-C3 facets. Degenerative changes along the right side of the  occiput and C1. Prominent central disc bulge or disc osteophyte complex at C5-C6. Upper chest: Negative. Other: None IMPRESSION: No acute intracranial abnormality. 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 Electronically Signed   By: Markus Daft M.D.   On: 11/07/2017 15:52   Ct Cervical Spine Wo Contrast  Result Date: 11/07/2017 CLINICAL DATA:  78 year old with right arm weakness and numbness. Stroke suspected. EXAM: CT HEAD WITHOUT CONTRAST CT CERVICAL SPINE WITHOUT CONTRAST TECHNIQUE: Multidetector CT imaging of the head and cervical spine was performed following the standard protocol without intravenous contrast. Multiplanar CT image reconstructions of the cervical spine were also generated. COMPARISON:  Cervical spine MRI 07/31/2017 FINDINGS: CT HEAD FINDINGS Brain: Mild cerebral atrophy. No evidence for acute hemorrhage, mass lesion, midline shift, hydrocephalus or large infarct. Subtle low-density in the periventricular white matter. Vascular: No hyperdense vessel or unexpected calcification. Skull: Normal. Negative for fracture or focal lesion. Sinuses/Orbits: Small amount of focal disease in the left sphenoid sinus. Other: None. CT CERVICAL SPINE FINDINGS Alignment: Again noted is minimal anterolisthesis at C4-C5. Skull base and vertebrae: No acute  fracture. No primary bone lesion or focal pathologic process. Soft tissues and spinal canal: Scattered calcifications throughout the bilateral parotid glands, right side greater than left. No paravertebral soft tissue abnormality. Thyroid tissue is mildly prominent. Disc levels: Severe multilevel degenerative facet disease. Ankylosis of the right C2-C3 facets. Degenerative changes along the right side of the occiput and C1. Prominent central disc bulge or disc osteophyte complex at C5-C6. Upper chest: Negative. Other: None IMPRESSION: No acute intracranial abnormality. 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 Electronically Signed   By: Markus Daft M.D.   On: 11/07/2017 15:52   Mr Brain Wo Contrast  Result Date: 11/08/2017 CLINICAL DATA:  Follow-up suspected stroke. EXAM: MRI HEAD WITHOUT CONTRAST TECHNIQUE: Multiplanar, multiecho pulse sequences of the brain and surrounding structures were obtained without intravenous contrast. COMPARISON:  CT HEAD November 07, 2017 FINDINGS: INTRACRANIAL CONTENTS: No reduced diffusion to suggest acute ischemia. No susceptibility artifact to suggest hemorrhage. The ventricles and sulci are normal for patient's age. Patchy supratentorial and pontine white matter FLAIR T2 hyperintensities. No suspicious parenchymal signal, masses, mass effect. No abnormal extra-axial fluid collections. Mildly prominent RIGHT posterior fossa and RIGHT greater than LEFT frontal extra-axial spaces. No extra-axial masses. VASCULAR: Normal major intracranial vascular flow voids present at skull base. SKULL AND UPPER CERVICAL SPINE: No abnormal sellar expansion. No suspicious calvarial bone marrow signal. Craniocervical junction maintained. SINUSES/ORBITS: Mild paranasal sinus mucosal thickening without air-fluid levels. Maxillary air cells are well aerated. Included ocular globes and orbital contents are non-suspicious. Status post RIGHT ocular lens  implant. OTHER: None. IMPRESSION: 1. No acute intracranial process. 2. Mild-to-moderate chronic small vessel ischemic changes. Electronically Signed   By: Elon Alas M.D.   On: 11/08/2017 00:41    Subjective: Feels pain is relieved in hands, joints. Still some weakness more on the right.   Discharge Exam: Vitals:   11/12/17 0806 11/12/17 0845  BP:  (!) 150/78  Pulse:  91  Resp:  17  Temp:  98.6 F (37 C)  SpO2: 98% 100%   General: Pt is alert, awake, not in acute distress Cardiovascular: RRR, S1/S2 +, no rubs, no gallops Respiratory: CTA bilaterally, no wheezing, no rhonchi Abdominal: Soft, NT, ND, bowel sounds + Extremities: No edema, no cyanosis Neuro: Alert, oriented. Subtle right sided weakness.   Labs: BNP (last 3 results) No results for input(s):  BNP in the last 8760 hours. Basic Metabolic Panel: Recent Labs  Lab 11/07/17 1519 11/08/17 1045 11/09/17 0734  NA 137  --  134*  K 3.2*  --  4.1  CL 99  --  103  CO2 25  --  20*  GLUCOSE 171*  --  235*  BUN 13  --  18  CREATININE 0.57  --  0.85  CALCIUM 8.7*  --  7.7*  MG  --  1.7  --    Liver Function Tests: Recent Labs  Lab 11/07/17 1519  AST 28  ALT 18  ALKPHOS 66  BILITOT 0.6  PROT 7.0  ALBUMIN 4.0   No results for input(s): LIPASE, AMYLASE in the last 168 hours. No results for input(s): AMMONIA in the last 168 hours. CBC: Recent Labs  Lab 11/07/17 1519 11/09/17 0734  WBC 6.3 7.0  NEUTROABS 4.2  --   HGB 12.2 11.8*  HCT 37.1 38.6  MCV 91.4 97.0  PLT 122* 135*   Cardiac Enzymes: Recent Labs  Lab 11/07/17 1519 11/07/17 1835 11/07/17 2253 11/08/17 0736  CKTOTAL  --   --   --  52  TROPONINI 0.05* 0.06* 0.04*  --    BNP: Invalid input(s): POCBNP CBG: Recent Labs  Lab 11/11/17 1133 11/11/17 1646 11/11/17 2123 11/12/17 0621 11/12/17 1130  GLUCAP 202* 340* 175* 184* 191*   D-Dimer No results for input(s): DDIMER in the last 72 hours. Hgb A1c No results for input(s): HGBA1C  in the last 72 hours. Lipid Profile No results for input(s): CHOL, HDL, LDLCALC, TRIG, CHOLHDL, LDLDIRECT in the last 72 hours. Thyroid function studies No results for input(s): TSH, T4TOTAL, T3FREE, THYROIDAB in the last 72 hours.  Invalid input(s): FREET3 Anemia work up No results for input(s): VITAMINB12, FOLATE, FERRITIN, TIBC, IRON, RETICCTPCT in the last 72 hours. Urinalysis    Component Value Date/Time   COLORURINE YELLOW 11/07/2017 1530   APPEARANCEUR CLEAR 11/07/2017 1530   APPEARANCEUR Cloudy (A) 01/02/2016 1113   LABSPEC 1.015 11/07/2017 1530   PHURINE 6.5 11/07/2017 1530   GLUCOSEU 100 (A) 11/07/2017 1530   HGBUR NEGATIVE 11/07/2017 1530   BILIRUBINUR NEGATIVE 11/07/2017 1530   BILIRUBINUR Negative 01/02/2016 1113   KETONESUR NEGATIVE 11/07/2017 1530   PROTEINUR NEGATIVE 11/07/2017 1530   UROBILINOGEN 0.2 11/03/2014 2210   NITRITE NEGATIVE 11/07/2017 1530   LEUKOCYTESUR TRACE (A) 11/07/2017 1530   LEUKOCYTESUR 3+ (A) 01/02/2016 1113    Microbiology Recent Results (from the past 240 hour(s))  Urine Culture     Status: Abnormal   Collection Time: 11/07/17 10:44 PM  Result Value Ref Range Status   Specimen Description URINE, RANDOM  Final   Special Requests   Final    NONE Performed at [facility], [place] [address] [place], [place] [address]    Culture MULTIPLE SPECIES PRESENT, SUGGEST RECOLLECTION (A)  Final   Report Status 11/10/2017 FINAL  Final    Time coordinating discharge: Approximately 40 minutes  Patrecia Pour, MD  [facility] 11/12/2017, 1:48 PM Pager [phone]

## 2017-11-12 NOTE — Clinical Social Work Placement (Signed)
   CLINICAL SOCIAL WORK PLACEMENT  NOTE  Date:  11/12/2017  Patient Details  Name: Kerry Brooks MRN: [id number] Date of Birth: [birth year]  Clinical Social Work is seeking post-discharge placement for this patient at the Skilled  Nursing Facility level of care (*CSW will initial, date and re-position this form in  chart as items are completed):      Patient/family provided with [facility] Clinical Social Work Department's list of facilities offering this level of care within the geographic area requested by the patient (or if unable, by the patient's family).      Patient/family informed of their freedom to choose among providers that offer the needed level of care, that participate in Medicare, Medicaid or managed care program needed by the patient, have an available bed and are willing to accept the patient.      Patient/family informed of [facility]'s ownership interest in [facility] and [facility], as well as of the fact that they are under no obligation to receive care at these facilities.  PASRR submitted to EDS on 11/10/17     PASRR number received on 11/10/17     Existing PASRR number confirmed on       FL2 transmitted to all facilities in geographic area requested by pt/family on 11/10/17     FL2 transmitted to all facilities within larger geographic area on       Patient informed that his/her managed care company has contracts with or will negotiate with certain facilities, including the following:        Yes   Patient/family informed of bed offers received.  [facility]     Physician recommends and patient chooses bed at      Patient to be [facility] on 11/12/17.  Patient to be transferred to facility by [facility]     Patient family notified on 11/12/17 of transfer.  Name of family member notified:  Left voicemail for daughter, Marylene Landngela [phone])     PHYSICIAN Please prepare prescriptions      Additional Comment:    _______________________________________________ Margarito LinerSarah C Lena Gores, LCSW 11/12/2017, 2:35 PM

## 2018-01-19 ENCOUNTER — Encounter: Payer: Self-pay | Admitting: Neurology

## 2018-01-19 ENCOUNTER — Ambulatory Visit (INDEPENDENT_AMBULATORY_CARE_PROVIDER_SITE_OTHER): Payer: Medicare Other | Admitting: Neurology

## 2018-01-19 VITALS — BP 145/77 | HR 85 | Ht 63.0 in | Wt 207.5 lb

## 2018-01-19 DIAGNOSIS — M3219 Other organ or system involvement in systemic lupus erythematosus: Secondary | ICD-10-CM

## 2018-01-19 DIAGNOSIS — M542 Cervicalgia: Secondary | ICD-10-CM

## 2018-01-19 DIAGNOSIS — M353 Polymyalgia rheumatica: Secondary | ICD-10-CM

## 2018-01-19 DIAGNOSIS — M5440 Lumbago with sciatica, unspecified side: Secondary | ICD-10-CM

## 2018-01-19 MED ORDER — HYDROCODONE-ACETAMINOPHEN 7.5-325 MG PO TABS: 1.0000 | ORAL_TABLET | Freq: Two times a day (BID) | ORAL | 0 refills | Status: DC | PRN

## 2018-01-19 NOTE — Progress Notes (Signed)
[facility]  PATIENT: Kerry Brooks DOB: Mar 19, 1940  REFERRING DOCTOR OR PCP:  Brooke Bonito  SOURCE: patient  _________________________________   HISTORICAL  CHIEF COMPLAINT:  Chief Complaint  Patient presents with  . Follow-up    4 month follow up. Alone. Rm 13. Patient mentioned that her back and shoulder pain are the same.    HISTORY OF PRESENT ILLNESS:  Kerry Brooks is a 77 y.o. woman with LBP, left shoulder pain and knee pain.    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   She had joint swelling and felt weaker on her right.   She was placed on prednisone and went to rehab.   She remains on Actemra infusions.  Sugars were elevated.  Her statin was d/c.   She saw Urology this week and was placed on Cipro for a UTI.   She is doing some PT.  Worse pain is in her lower back currently.   It is usually just lumbar but occasionally there is some radiation down the legs.   Her hip pain is better since the steroids.   Hydrocodone has helped the pain some.  She takes up to 2 hydrocodones a day.    Her neck pain is doing better since changing pillows.    Gabapentin  Has also helped her pain and she tolerates it well.    Update 09/16/2017: Her worse pain is in the lower back across both sides into the buttocks.  Pain is worse with walking.    TPI's have helped in the past.    Currently L = R.    She had a lot of neck pain and headache, right > left.   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   She was diagnosed with occipital neuralgia.    She continues to note bilateral shoulder pain, left > right.   She had a recent bilateral knee injection and she noted her sugars increased over 300.     IMPRESSION: 1. Minimal grade 1 C4-5 anterolisthesis on a degenerative basis. No acute osseous process. 2. Mild low-grade upper cervical paraspinal muscle strain. 3. Mild canal stenosis at C5-6 in part due to 3 x 4 x 6  mm contiguous disc extrusion which mildly deforms the ventral spinal cord. Mild canal stenosis C3-4. 4. Neural foraminal narrowing C3-4 through C6-7: Severe on the left at C3-4, severe on the right at C4-5.  She is reporting she is sleeping better with gabapentin and doxepin.    Flexeril in the past made her too sleepy.  Update 05/14/2017: She is reporting more pain in the left shoulder and the right > left buttock.   Shoulder pain is worse with external rotation and elevation.     She does not note arm weakness (but limited by pain).   No radiation of pain into the arm.    The buttock pain is going a little lower but not much into the leg.  She has had generalized muscle aches that worsened when she went on Crestor and improved when she stropped.     Insomnia is better with gabapentin and doxepin  Update 01/02/2017:    Currently,  the worse pain is across her back.  Pain increases with chores/exertion.    The pain is generally reduced when she is sitting. This is similar to pain that she has had in the past that improved with trigger point injections. When the pain is  more severe she is taking the hydrocodone, usually 2 times a day.  She also reprorts pain in her left shoulder.  Pain increases with lifting and external rotation.   The pain is improved if the shoulder is relaxed and she holds her arm into her chest. This is similar to pain that she has experienced in the past that improved with a subacromial bursa injection.  Volltaren gel helped but was expensive  Insomnia has done better with a combination of gabapentin and doxepin. She tolerates these medications well.  -  From 09/10/2016: Left sided sciatica:   She reports pain in her lower back and buttock and down the left leg   In the past, LBP/buttock pain improved after piriformis muscle injections.  Pain has increased over the last month on the left.  The right back and leg are fine. When intense, the pain will radiate down the left  posterior leg.  She has noted some dysesthesias in her left leg (from buttock to ankle).   Gabapentin and hydrocodone have helped the pain in the past.   She takes 1-2 hydrocodones a day if pain flares up  Neck/shoulder pain:   She feels her neck and shoulder pain are not too bad at the current time. In the past, she received benefit from subacromial bursa injections.   Lupus/PMR/Knee pain:   She see Dr Luane School in [facility] and is on Actemra for Lupus and prednisone for PMR  DM:  She has DM but denies numbness in her toes.    Mood/Insomnia:   Insomnia has done better with gabapentin and doxepin at bedtime.Marland Kitchen         REVIEW OF SYSTEMS: Constitutional: No fevers, chills, sweats, or change in appetite Eyes: No visual changes, double vision, eye pain Ear, nose and throat: No hearing loss, ear pain, nasal congestion, sore throat Cardiovascular: No chest pain, palpitations Respiratory: No shortness of breath at rest or with exertion.   No wheezes GastrointestinaI: No nausea, vomiting, diarrhea, abdominal pain, fecal incontinence Genitourinary: No dysuria, urinary retention or frequency.  No nocturia. Musculoskeletal: Mild neck pain.  Notes left shoulder pain and bilat back pain Integumentary: No rash, pruritus, skin lesions Neurological: as above Psychiatric: No depression at this time.  No anxiety Endocrine: No palpitations, diaphoresis, change in appetite, change in weigh or increased thirst Hematologic/Lymphatic: No anemia, purpura, petechiae. Allergic/Immunologic: No itchy/runny eyes, nasal congestion, recent allergic reactions, rashes  ALLERGIES: Allergies  Allergen Reactions  . Canagliflozin Other (See Comments)    Other reaction(s): Other (See Comments) unknown unknown  . Propoxyphene Other (See Comments)    unknown    HOME MEDICATIONS:  Current Outpatient Medications:  .  alendronate (FOSAMAX) 70 MG tablet, Take 70 mg by mouth once a week., Disp: , Rfl: 11 .  amLODipine  (NORVASC) 5 MG tablet, Take 5 mg by mouth., Disp: , Rfl:  .  aspirin EC 81 MG tablet, Take 81 mg by mouth daily., Disp: , Rfl:  .  brinzolamide (AZOPT) 1 % ophthalmic suspension, Place 1 drop into both eyes 2 (two) times daily. Frequency:   Dosage:0.0     Instructions:  Note:, Disp: , Rfl:  .  carvedilol (COREG) 25 MG tablet, Take 25 mg by mouth 2 (two) times daily with a meal. , Disp: , Rfl:  .  ciprofloxacin (CIPRO) 500 MG tablet, Take 500 mg by mouth daily., Disp: , Rfl:  .  Cyanocobalamin (VITAMIN B-12 CR) 1000 MCG TBCR, Take 1,000 mcg by mouth daily. Frequency:  Dosage:0.0     Instructions:  Note:, Disp: , Rfl:  .  cyclobenzaprine (FLEXERIL) 5 MG tablet, Take 1 tablet (5 mg total) by mouth 3 (three) times daily as needed. Take 5 mg at bedtime nightly and can take up to 3 a day as needed, Disp: 9 tablet, Rfl: 0 .  diclofenac sodium (VOLTAREN) 1 % GEL, Apply 2 g 3 (three) times daily topically. Frequency:   Dosage:0.0     Instructions:  Note: (Patient taking differently: Apply 2 g topically as needed. Frequency:   Dosage:0.0     Instructions:  Note:), Disp: 200 g, Rfl: 3 .  doxepin (SINEQUAN) 10 MG capsule, Take 1 capsule (10 mg total) by mouth at bedtime. (Patient taking differently: Take 10 mg by mouth at bedtime as needed. ), Disp: 305 capsule, Rfl: 5 .  Ergocalciferol (VITAMIN D2) 2000 UNITS TABS, Take 2,000 Units by mouth daily. Frequency:   Dosage:0     Instructions:  Note:take 5000 iu everyday, Disp: , Rfl:  .  fluticasone (FLONASE) 50 MCG/ACT nasal spray, Place 1 spray into both nostrils daily. Frequency:   Dosage:0.0     Instructions:  Note:, Disp: , Rfl:  .  gabapentin (NEURONTIN) 300 MG capsule, TAKE 1 CAPSULE IN THE MORNING, TAKE 1 CAPSULE IN THE EVENING, AND TAKE 2 CAPSULES AT NIGHT (Patient taking differently: Take 300 mg by mouth See admin instructions. TAKE 100 mg  CAPSULE IN THE MORNING, TAKE 100 mg CAPSULE IN THE EVENING, AND TAKE 200 mg CAPSULES AT NIGHT), Disp: 360 capsule, Rfl:  3 .  glucose blood (ONE TOUCH ULTRA TEST) test strip, Use 1 test strip to check blood sugar twice daily, Disp: , Rfl:  .  HYDROcodone-acetaminophen (NORCO) 7.5-325 MG tablet, Take 1 tablet by mouth 2 (two) times daily as needed., Disp: 60 tablet, Rfl: 0 .  Insulin Degludec (TRESIBA) 100 UNIT/ML SOLN, Inject 40 Units into the skin at bedtime., Disp: , Rfl:  .  losartan-hydrochlorothiazide (HYZAAR) 100-12.5 MG per tablet, Take 1 tablet by mouth daily. , Disp: , Rfl:  .  metFORMIN (GLUCOPHAGE) 500 MG tablet, Take 500 mg by mouth 2 (two) times daily with a meal. , Disp: , Rfl:  .  montelukast (SINGULAIR) 10 MG tablet, Take 10 mg by mouth at bedtime. , Disp: , Rfl: 11 .  pantoprazole (PROTONIX) 40 MG tablet, Take 40 mg by mouth 2 (two) times daily before a meal. , Disp: , Rfl:  .  potassium chloride (K-DUR) 10 MEQ tablet, Take 10 mEq by mouth daily. , Disp: , Rfl:  .  predniSONE (DELTASONE) 5 MG tablet, Take 2 tablets (10 mg total) by mouth 2 (two) times daily with a meal., Disp: , Rfl:  .  Tocilizumab (ACTEMRA IV), Inject into the vein every 30 (thirty) days., Disp: , Rfl:  .  trimethoprim (TRIMPEX) 100 MG tablet, Take 100 mg by mouth at bedtime., Disp: , Rfl: 99 .  VENTOLIN HFA 108 (90 Base) MCG/ACT inhaler, Inhale 1 puff into the lungs every 4 (four) hours as needed., Disp: , Rfl: 5  PAST MEDICAL HISTORY: Past Medical History:  Diagnosis Date  . Asthma   . Diabetes mellitus without complication (HCC)   . Hypertension   . Lupus (systemic lupus erythematosus) (HCC)   . Neuropathy   . Raynaud's disease   . Vertebral fracture, osteoporotic (HCC)     PAST SURGICAL HISTORY: Past Surgical History:  Procedure Laterality Date  . TONSILLECTOMY      FAMILY HISTORY: History reviewed.  No pertinent family history.  SOCIAL HISTORY:  Social History   Socioeconomic History  . Marital status: Divorced    Spouse name: Not on file  . Number of children: Not on file  . Years of education: Not on  file  . Highest education level: Not on file  Occupational History  . Not on file  Social Needs  . Financial resource strain: Not on file  . Food insecurity:    Worry: Not on file    Inability: Not on file  . Transportation needs:    Medical: Not on file    Non-medical: Not on file  Tobacco Use  . Smoking status: Never Smoker  . Smokeless tobacco: Never Used  Substance and Sexual Activity  . Alcohol use: No    Alcohol/week: 0.0 standard drinks  . Drug use: No  . Sexual activity: Not on file  Lifestyle  . Physical activity:    Days per week: Not on file    Minutes per session: Not on file  . Stress: Not on file  Relationships  . Social connections:    Talks on phone: Not on file    Gets together: Not on file    Attends religious service: Not on file    Active member of club or organization: Not on file    Attends meetings of clubs or organizations: Not on file    Relationship status: Not on file  . Intimate partner violence:    Fear of current or ex partner: Not on file    Emotionally abused: Not on file    Physically abused: Not on file    Forced sexual activity: Not on file  Other Topics Concern  . Not on file  Social History Narrative  . Not on file     PHYSICAL EXAM  Vitals:   01/19/18 1036  BP: (!) 145/77  Pulse: 85  Weight: 207 lb 8 oz (94.1 kg)  Height: 5\' 3"  (1.6 m)    Body mass index is 36.76 kg/m.   General: The patient is well-developed and well-nourished and in no acute distress  Neck:    there is only mild tenderness in the cervical paraspinal muscles and trapezius muscles.  Musculoskeletal: She has mild tenderness over the left subacromial bursa and range of motion is reduced in the left shoulder.  This is less tenderness than at most visits.  She has moderate tenderness over the lower lumbar paraspinal muscles and mild tenderness over the piriformis muscles bilaterally.    Neurologic Exam  Mental status: The patient is alert and  oriented x 3 at the time of the examination. The patient has apparent normal recent and remote memory, with an apparently normal attention span and concentration ability.   Speech is normal.  Cranial nerves: Extraocular movements are full.   Facial strength is normal.  Trapezius strength is normal.. No dysarthria is noted.     Motor:  Muscle bulk is normal.   Tone is normal. Strength is  5 / 5 in all 4 extremities.   Sensory: Intact sensation to touch in the arms or legs  Gait and station: She needs to use her arms to stand up.  Once up station is normal.  Her gait is arthritic and she is unable to do tandem walk.  Reflexes: Deep tendon reflexes are symmetric and 1 in arms and absent in legs bilaterally.       DIAGNOSTIC DATA (LABS, IMAGING, TESTING) - I reviewed patient records, labs, notes, testing  and imaging myself where available.     ASSESSMENT AND PLAN  Bilateral low back pain with sciatica, sciatica laterality unspecified, unspecified chronicity  Systemic lupus erythematosus with other organ involvement, unspecified SLE type (HCC)  Neck pain  Polymyalgia rheumatica (HCC)   1.    Bilateral L3-L4-L5 paraspinal muscle trigger point injections with 40 mg Depo-Medrol Depo-Medrol in 5 mL Marcaine using sterile technique.. She tolerated the procedure well and there were no complications.   2.   Try to stay active and exercise as tolerated 3.  I refilled the hydrocodone #60. 4    She will return to see me in 4 months,  sooner if she has new or worsening neurologic symptoms.   Richard A. Epimenio FootSater, MD, PhD 01/19/2018, 11:25 AM Certified in Neurology, Clinical Neurophysiology, Sleep Medicine, Pain Medicine and Neuroimaging  [facility] [address], [address] [place], [place] [address] [phone] p

## 2018-05-06 ENCOUNTER — Encounter: Payer: Self-pay | Admitting: Neurology

## 2018-05-06 ENCOUNTER — Ambulatory Visit (INDEPENDENT_AMBULATORY_CARE_PROVIDER_SITE_OTHER): Payer: Medicare Other | Admitting: Neurology

## 2018-05-06 ENCOUNTER — Other Ambulatory Visit: Payer: Self-pay

## 2018-05-06 VITALS — BP 94/58 | HR 73 | Resp 20 | Ht 63.0 in | Wt 201.0 lb

## 2018-05-06 DIAGNOSIS — G8929 Other chronic pain: Secondary | ICD-10-CM | POA: Diagnosis not present

## 2018-05-06 DIAGNOSIS — M3219 Other organ or system involvement in systemic lupus erythematosus: Secondary | ICD-10-CM | POA: Diagnosis not present

## 2018-05-06 DIAGNOSIS — M5441 Lumbago with sciatica, right side: Secondary | ICD-10-CM

## 2018-05-06 DIAGNOSIS — M5440 Lumbago with sciatica, unspecified side: Secondary | ICD-10-CM | POA: Diagnosis not present

## 2018-05-06 DIAGNOSIS — M5442 Lumbago with sciatica, left side: Secondary | ICD-10-CM | POA: Diagnosis not present

## 2018-05-06 DIAGNOSIS — M542 Cervicalgia: Secondary | ICD-10-CM | POA: Diagnosis not present

## 2018-05-06 MED ORDER — GABAPENTIN 300 MG PO CAPS: ORAL_CAPSULE | ORAL | 3 refills | Status: DC

## 2018-05-06 MED ORDER — DICLOFENAC SODIUM 1 % TD GEL: 2.0000 g | Freq: Three times a day (TID) | TRANSDERMAL | 3 refills | Status: DC

## 2018-05-06 MED ORDER — HYDROCODONE-ACETAMINOPHEN 7.5-325 MG PO TABS: 1.0000 | ORAL_TABLET | Freq: Two times a day (BID) | ORAL | 0 refills | Status: DC

## 2018-05-06 MED ORDER — HYDROCODONE-ACETAMINOPHEN 7.5-325 MG PO TABS: 1.0000 | ORAL_TABLET | Freq: Two times a day (BID) | ORAL | 0 refills | Status: DC | PRN

## 2018-05-06 NOTE — Progress Notes (Signed)
[facility]  PATIENT: Kerry Brooks DOB: 1940/10/01  REFERRING DOCTOR OR PCP:  Brooke Bonito  SOURCE: patient  _________________________________   HISTORICAL  CHIEF COMPLAINT:  Chief Complaint  Patient presents with   Back Pain    Rm. 13.  Sts. back pain is worse. She has not been able to  have Actemra for Lupus since November, due to chronic UTI.  She is taking Prednisone 10mg  once daily./fim   Left Shoulder Pain   Lupus    HISTORY OF PRESENT ILLNESS:  Kerry Brooks is a 78 y.o. woman with LBP, left shoulder pain and knee pain.    Update 05/06/2018: She reports more lower back pain and stiffness.   She has had recurrent UTIs and her lupus medication (Actemra) and steroids were reduced.   Gabapentin, hydrocodone and diclofenac help some.    TPIs have helped but due to infections she is supposed to avoid prednisone.    She denies any new weakness.      Her left shoulder pain is not too bad though ROM is still reduced.   Her knees still hurt but Voltaren gel and hydrocodone help some.    She has frequent UTI's despite multiple rounds of ABx  Update 01/19/2018: She had a flare of SLE/PMR in September and went to [facility].   She had joint swelling and felt weaker on her right.   She was placed on prednisone and went to rehab.   She remains on Actemra infusions.  Sugars were elevated.  Her statin was d/c.   She saw Urology this week and was placed on Cipro for a UTI.   She is doing some PT.  Worse pain is in her lower back currently.   It is usually just lumbar but occasionally there is some radiation down the legs.   Her hip pain is better since the steroids.   Hydrocodone has helped the pain some.  She takes up to 2 hydrocodones a day.    Her neck pain is doing better since changing pillows.    Gabapentin  Has also helped her pain and she tolerates it well.    Update 09/16/2017: Her worse pain is in the lower back across both sides into the buttocks.  Pain is  worse with walking.    TPI's have helped in the past.    Currently L = R.    She had a lot of neck pain and headache, right > left.   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   She was diagnosed with occipital neuralgia.    She continues to note bilateral shoulder pain, left > right.   She had a recent bilateral knee injection and she noted her sugars increased over 300.     IMPRESSION: 1. Minimal grade 1 C4-5 anterolisthesis on a degenerative basis. No acute osseous process. 2. Mild low-grade upper cervical paraspinal muscle strain. 3. Mild canal stenosis at C5-6 in part due to 3 x 4 x 6 mm contiguous disc extrusion which mildly deforms the ventral spinal cord. Mild canal stenosis C3-4. 4. Neural foraminal narrowing C3-4 through C6-7: Severe on the left at C3-4, severe on the right at C4-5.  She is reporting she is sleeping better with gabapentin and doxepin.    Flexeril in the past made her too sleepy.  Update 05/14/2017: She is reporting more pain in the left shoulder and the right > left buttock.   Shoulder pain is worse with external rotation  and elevation.     She does not note arm weakness (but limited by pain).   No radiation of pain into the arm.    The buttock pain is going a little lower but not much into the leg.  She has had generalized muscle aches that worsened when she went on Crestor and improved when she stropped.     Insomnia is better with gabapentin and doxepin  Update 01/02/2017:    Currently,  the worse pain is across her back.  Pain increases with chores/exertion.    The pain is generally reduced when she is sitting. This is similar to pain that she has had in the past that improved with trigger point injections. When the pain is more severe she is taking the hydrocodone, usually 2 times a day.  She also reprorts pain in her left shoulder.  Pain increases with lifting and external rotation.   The pain is improved if the shoulder is relaxed and she  holds her arm into her chest. This is similar to pain that she has experienced in the past that improved with a subacromial bursa injection.  Volltaren gel helped but was expensive  Insomnia has done better with a combination of gabapentin and doxepin. She tolerates these medications well.  -  From 09/10/2016: Left sided sciatica:   She reports pain in her lower back and buttock and down the left leg   In the past, LBP/buttock pain improved after piriformis muscle injections.  Pain has increased over the last month on the left.  The right back and leg are fine. When intense, the pain will radiate down the left posterior leg.  She has noted some dysesthesias in her left leg (from buttock to ankle).   Gabapentin and hydrocodone have helped the pain in the past.   She takes 1-2 hydrocodones a day if pain flares up  Neck/shoulder pain:   She feels her neck and shoulder pain are not too bad at the current time. In the past, she received benefit from subacromial bursa injections.   Lupus/PMR/Knee pain:   She see Dr Luane School in [facility] and is on Actemra for Lupus and prednisone for PMR  DM:  She has DM but denies numbness in her toes.    Mood/Insomnia:   Insomnia has done better with gabapentin and doxepin at bedtime.Marland Kitchen         REVIEW OF SYSTEMS: Constitutional: No fevers, chills, sweats, or change in appetite Eyes: No visual changes, double vision, eye pain Ear, nose and throat: No hearing loss, ear pain, nasal congestion, sore throat Cardiovascular: No chest pain, palpitations Respiratory: No shortness of breath at rest or with exertion.   No wheezes GastrointestinaI: No nausea, vomiting, diarrhea, abdominal pain, fecal incontinence Genitourinary: No dysuria, urinary retention or frequency.  No nocturia. Musculoskeletal: Mild neck pain.  Notes left shoulder pain and bilat back pain Integumentary: No rash, pruritus, skin lesions Neurological: as above Psychiatric: No depression at this time.  No  anxiety Endocrine: No palpitations, diaphoresis, change in appetite, change in weigh or increased thirst Hematologic/Lymphatic: No anemia, purpura, petechiae. Allergic/Immunologic: No itchy/runny eyes, nasal congestion, recent allergic reactions, rashes  ALLERGIES: Allergies  Allergen Reactions   Canagliflozin Other (See Comments)    Other reaction(s): Other (See Comments) unknown unknown   Propoxyphene Other (See Comments)    unknown    HOME MEDICATIONS:  Current Outpatient Medications:    alendronate (FOSAMAX) 70 MG tablet, Take 70 mg by mouth once a week., Disp: ,  Rfl: 11   amLODipine (NORVASC) 5 MG tablet, Take 5 mg by mouth., Disp: , Rfl:    aspirin EC 81 MG tablet, Take 81 mg by mouth daily., Disp: , Rfl:    brinzolamide (AZOPT) 1 % ophthalmic suspension, Place 1 drop into both eyes 2 (two) times daily. Frequency:   Dosage:0.0     Instructions:  Note:, Disp: , Rfl:    carvedilol (COREG) 25 MG tablet, Take 25 mg by mouth 2 (two) times daily with a meal. , Disp: , Rfl:    ciprofloxacin (CIPRO) 500 MG tablet, Take 500 mg by mouth daily., Disp: , Rfl:    Cyanocobalamin (VITAMIN B-12 CR) 1000 MCG TBCR, Take 1,000 mcg by mouth daily. Frequency:   Dosage:0.0     Instructions:  Note:, Disp: , Rfl:    cyclobenzaprine (FLEXERIL) 5 MG tablet, Take 1 tablet (5 mg total) by mouth 3 (three) times daily as needed. Take 5 mg at bedtime nightly and can take up to 3 a day as needed, Disp: 9 tablet, Rfl: 0   diclofenac sodium (VOLTAREN) 1 % GEL, Apply 2 g topically 3 (three) times daily. CANCEL PRESCRIPTION OF DICLOFENAC GEL, Disp: 200 g, Rfl: 3   doxepin (SINEQUAN) 10 MG capsule, Take 1 capsule (10 mg total) by mouth at bedtime. (Patient taking differently: Take 10 mg by mouth at bedtime as needed. ), Disp: 305 capsule, Rfl: 5   Ergocalciferol (VITAMIN D2) 2000 UNITS TABS, Take 2,000 Units by mouth daily. Frequency:   Dosage:0     Instructions:  Note:take 5000 iu everyday, Disp: , Rfl:     fluticasone (FLONASE) 50 MCG/ACT nasal spray, Place 1 spray into both nostrils daily. Frequency:   Dosage:0.0     Instructions:  Note:, Disp: , Rfl:    gabapentin (NEURONTIN) 300 MG capsule, Take 1 capsule in the morning, 1 capsules in the afternoon and 2 capsules at night., Disp: 360 capsule, Rfl: 3   glucose blood (ONE TOUCH ULTRA TEST) test strip, Use 1 test strip to check blood sugar twice daily, Disp: , Rfl:    HYDROcodone-acetaminophen (NORCO) 7.5-325 MG tablet, Take 1 tablet by mouth 2 (two) times daily., Disp: 60 tablet, Rfl: 0   Insulin Degludec (TRESIBA) 100 UNIT/ML SOLN, Inject 40 Units into the skin at bedtime., Disp: , Rfl:    losartan-hydrochlorothiazide (HYZAAR) 100-12.5 MG per tablet, Take 1 tablet by mouth daily. , Disp: , Rfl:    montelukast (SINGULAIR) 10 MG tablet, Take 10 mg by mouth at bedtime. , Disp: , Rfl: 11   pantoprazole (PROTONIX) 40 MG tablet, Take 40 mg by mouth 2 (two) times daily before a meal. , Disp: , Rfl:    potassium chloride (K-DUR) 10 MEQ tablet, Take 10 mEq by mouth daily. , Disp: , Rfl:    predniSONE (DELTASONE) 5 MG tablet, Take 2 tablets (10 mg total) by mouth 2 (two) times daily with a meal., Disp: , Rfl:    Tocilizumab (ACTEMRA IV), Inject into the vein every 30 (thirty) days., Disp: , Rfl:    trimethoprim (TRIMPEX) 100 MG tablet, Take 100 mg by mouth at bedtime., Disp: , Rfl: 99   VENTOLIN HFA 108 (90 Base) MCG/ACT inhaler, Inhale 1 puff into the lungs every 4 (four) hours as needed., Disp: , Rfl: 5  PAST MEDICAL HISTORY: Past Medical History:  Diagnosis Date   Asthma    Diabetes mellitus without complication (HCC)    Hypertension    Lupus (systemic lupus erythematosus) (HCC)  Neuropathy    Raynaud's disease    Vertebral fracture, osteoporotic (HCC)     PAST SURGICAL HISTORY: Past Surgical History:  Procedure Laterality Date   TONSILLECTOMY      FAMILY HISTORY: No family history on file.  SOCIAL  HISTORY:  Social History   Socioeconomic History   Marital status: Divorced    Spouse name: Not on file   Number of children: Not on file   Years of education: Not on file   Highest education level: Not on file  Occupational History   Not on file  Social Needs   Financial resource strain: Not on file   Food insecurity:    Worry: Not on file    Inability: Not on file   Transportation needs:    Medical: Not on file    Non-medical: Not on file  Tobacco Use   Smoking status: Never Smoker   Smokeless tobacco: Never Used  Substance and Sexual Activity   Alcohol use: No    Alcohol/week: 0.0 standard drinks   Drug use: No   Sexual activity: Not on file  Lifestyle   Physical activity:    Days per week: Not on file    Minutes per session: Not on file   Stress: Not on file  Relationships   Social connections:    Talks on phone: Not on file    Gets together: Not on file    Attends religious service: Not on file    Active member of club or organization: Not on file    Attends meetings of clubs or organizations: Not on file    Relationship status: Not on file   Intimate partner violence:    Fear of current or ex partner: Not on file    Emotionally abused: Not on file    Physically abused: Not on file    Forced sexual activity: Not on file  Other Topics Concern   Not on file  Social History Narrative   Not on file     PHYSICAL EXAM  Vitals:   05/06/18 1111  BP: (!) 94/58  Pulse: 73  Resp: 20  Weight: 201 lb (91.2 kg)  Height:  (1.6 m)    Body mass index is 35.61 kg/m.   General: The patient is well-developed and well-nourished and in no acute distress  Neck:   Mild tenderness in the cervical paraspinal muscles and trapezius muscles.  Musculoskeletal:   She has moderate tenderness over the lower lumbar paraspinal muscles and mild left subacromial bursa tenderness    Neurologic Exam  Mental status: The patient is alert and oriented x  3 at the time of the examination. The patient has apparent normal recent and remote memory, with an apparently normal attention span and concentration ability.   Speech is normal.  Cranial nerves: Extraocular movements are full.   Facial strength and sensation is normal.  Trapezius strength is normal.  Motor:  Muscle bulk is normal.   Tone is normal. Strength is  5 / 5 in all 4 extremities.   Sensory: Intact sensation to touch in the arms or legs  Gait and station: She needs to use her arms to stand up.  Her gait is arthritic.   She cannot tandem walk.  Romberg is negative.  Reflexes: Deep tendon reflexes are symmetric and 1 in arms and absent in legs bilaterally.       DIAGNOSTIC DATA (LABS, IMAGING, TESTING) - I reviewed patient records, labs, notes, testing and imaging myself  where available.     ASSESSMENT AND PLAN  Chronic bilateral low back pain with bilateral sciatica  Bilateral low back pain with sciatica, sciatica laterality unspecified, unspecified chronicity  Systemic lupus erythematosus with other organ involvement, unspecified SLE type (HCC)  Neck pain   1.    Bilateral L3-L4-L5 paraspinal muscle trigger point injections with  5 mL 0.5% Marcaine using sterile technique.. She tolerated the procedure well and there were no complications.   2.   Try to stay active and exercise as tolerated 3.   I refilled the hydrocodone #60.    [facility] was queried and she only gets opiates from our office.  Renew gabapentin and diclofenac (send diclofenac to local drugstore, other med's to [facility]) 4    She will return to see me in 4 months,  sooner if she has new or worsening neurologic symptoms.   Oletha Tolson A. Epimenio Foot, MD, PhD 05/06/2018, 11:49 AM Certified in Neurology, Clinical Neurophysiology, Sleep Medicine, Pain Medicine and Neuroimaging  [facility] [address], [address] [place], [place] [address] [phone] p

## 2018-06-09 ENCOUNTER — Other Ambulatory Visit: Payer: Self-pay | Admitting: Neurology

## 2018-06-09 MED ORDER — HYDROCODONE-ACETAMINOPHEN 7.5-325 MG PO TABS: 1.0000 | ORAL_TABLET | Freq: Two times a day (BID) | ORAL | 0 refills | Status: DC

## 2018-06-09 NOTE — Telephone Encounter (Signed)
Pt has called for a refill on her HYDROcodone-acetaminophen (NORCO) 7.5-325 MG tablet [facility]

## 2018-06-09 NOTE — Addendum Note (Signed)
Addended by: Hillis Range on: 06/09/2018 01:26 PM   Modules accepted: Orders

## 2018-07-23 ENCOUNTER — Other Ambulatory Visit: Payer: Self-pay

## 2018-07-23 MED ORDER — HYDROCODONE-ACETAMINOPHEN 7.5-325 MG PO TABS: 1.0000 | ORAL_TABLET | Freq: Two times a day (BID) | ORAL | 0 refills | Status: DC

## 2018-07-23 NOTE — Addendum Note (Signed)
Addended by: Hillis Range on: 07/23/2018 12:35 PM   Modules accepted: Orders

## 2018-07-23 NOTE — Telephone Encounter (Signed)
Pt called needing her HYDROcodone-acetaminophen (NORCO) 7.5-325 MG tablet sent to [facility] on file.

## 2018-08-26 ENCOUNTER — Other Ambulatory Visit: Payer: Self-pay | Admitting: Neurology

## 2018-08-26 MED ORDER — HYDROCODONE-ACETAMINOPHEN 7.5-325 MG PO TABS: 1.0000 | ORAL_TABLET | Freq: Two times a day (BID) | ORAL | 0 refills | Status: DC

## 2018-08-26 NOTE — Telephone Encounter (Signed)
Pt has called for a refill on her HYDROcodone-acetaminophen (NORCO) 7.5-325 MG tablet °[id number]  °

## 2018-08-26 NOTE — Addendum Note (Signed)
Addended by: Hope Pigeon on: 08/26/2018 10:34 AM   Modules accepted: Orders

## 2018-09-16 ENCOUNTER — Encounter: Payer: Self-pay | Admitting: Neurology

## 2018-09-16 ENCOUNTER — Other Ambulatory Visit: Payer: Self-pay

## 2018-09-16 ENCOUNTER — Ambulatory Visit (INDEPENDENT_AMBULATORY_CARE_PROVIDER_SITE_OTHER): Payer: Medicare Other | Admitting: Neurology

## 2018-09-16 VITALS — BP 122/65 | HR 74 | Temp 97.5°F | Ht 63.0 in | Wt 206.5 lb

## 2018-09-16 DIAGNOSIS — M329 Systemic lupus erythematosus, unspecified: Secondary | ICD-10-CM | POA: Diagnosis not present

## 2018-09-16 DIAGNOSIS — M353 Polymyalgia rheumatica: Secondary | ICD-10-CM | POA: Diagnosis not present

## 2018-09-16 DIAGNOSIS — G8929 Other chronic pain: Secondary | ICD-10-CM

## 2018-09-16 DIAGNOSIS — M5442 Lumbago with sciatica, left side: Secondary | ICD-10-CM

## 2018-09-16 DIAGNOSIS — M5441 Lumbago with sciatica, right side: Secondary | ICD-10-CM

## 2018-09-16 DIAGNOSIS — M542 Cervicalgia: Secondary | ICD-10-CM

## 2018-09-16 NOTE — Progress Notes (Addendum)
[facility]  PATIENT: Kerry Brooks DOB: [birth year]  REFERRING DOCTOR OR PCP:  Brooke BonitoWarren Gallemore  SOURCE: patient  _________________________________   HISTORICAL  CHIEF COMPLAINT:  Chief Complaint  Patient presents with  . Chronic low back pain    4 month follow up, "between my knees and lower back is my pain, currently have UTI""    HISTORY OF PRESENT ILLNESS:  Kerry Brooks is a 78 y.o. woman with LBP, left shoulder pain and knee pain.    Update 09/16/18: She reports more lower back pain.    She reports pain is mostly axial.    At the last visit, we did a lumbar paraspinal trigger point injection with som benefit.   She also reports knee pain.    Hydrocodone helps her pain some.    She has been having many UTI's.  She is currently on cephaalexin 500 mg po tid but feels nauseous and has diarrhea.      She sees Dr. Herma CarsonZ for SLE and is on Actemra as a DMT.   However due to frequent UTIs it is being held.    She has Tyoe 2 DM with decent control (HgbA1c 7.6-8.1).   She denies tingling or dysesthesia in feet.  She is following CDC Covid-19 guidelines.   She only leaves her house for doctor's visits and she has some visitors.     Update 05/06/2018: She reports more lower back pain and stiffness.   She has had recurrent UTIs and her lupus medication (Actemra) and steroids were reduced.   Gabapentin, hydrocodone and diclofenac help some.    TPIs have helped but due to infections she is supposed to avoid prednisone.    She denies any new weakness.      Her left shoulder pain is not too bad though ROM is still reduced.   Her knees still hurt but Voltaren gel and hydrocodone help some.    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   She had joint swelling and felt weaker on her right.   She was placed on prednisone and went to rehab.   She remains on Actemra infusions.  Sugars  were elevated.  Her statin was d/c.   She saw Urology this week and was placed on Cipro for a UTI.   She is doing some PT.  Worse pain is in her lower back currently.   It is usually just lumbar but occasionally there is some radiation down the legs.   Her hip pain is better since the steroids.   Hydrocodone has helped the pain some.  She takes up to 2 hydrocodones a day.    Her neck pain is doing better since changing pillows.    Gabapentin  Has also helped her pain and she tolerates it well.    Update 09/16/2017: Her worse pain is in the lower back across both sides into the buttocks.  Pain is worse with walking.    TPI's have helped in the past.    Currently L = R.    She had a lot of neck pain and headache, right > left.   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   She was diagnosed with occipital neuralgia.    She continues to note bilateral shoulder pain, left > right.   She had a recent bilateral knee injection and she noted her sugars  increased over 300.     IMPRESSION: 1. Minimal grade 1 C4-5 anterolisthesis on a degenerative basis. No acute osseous process. 2. Mild low-grade upper cervical paraspinal muscle strain. 3. Mild canal stenosis at C5-6 in part due to 3 x 4 x 6 mm contiguous disc extrusion which mildly deforms the ventral spinal cord. Mild canal stenosis C3-4. 4. Neural foraminal narrowing C3-4 through C6-7: Severe on the left at C3-4, severe on the right at C4-5.  She is reporting she is sleeping better with gabapentin and doxepin.    Flexeril in the past made her too sleepy.  Update 05/14/2017: She is reporting more pain in the left shoulder and the right > left buttock.   Shoulder pain is worse with external rotation and elevation.     She does not note arm weakness (but limited by pain).   No radiation of pain into the arm.    The buttock pain is going a little lower but not much into the leg.  She has had generalized muscle aches that worsened when she  went on Crestor and improved when she stropped.     Insomnia is better with gabapentin and doxepin  Update 01/02/2017:    Currently,  the worse pain is across her back.  Pain increases with chores/exertion.    The pain is generally reduced when she is sitting. This is similar to pain that she has had in the past that improved with trigger point injections. When the pain is more severe she is taking the hydrocodone, usually 2 times a day.  She also reprorts pain in her left shoulder.  Pain increases with lifting and external rotation.   The pain is improved if the shoulder is relaxed and she holds her arm into her chest. This is similar to pain that she has experienced in the past that improved with a subacromial bursa injection.  Volltaren gel helped but was expensive  Insomnia has done better with a combination of gabapentin and doxepin. She tolerates these medications well.  -  From 09/10/2016: Left sided sciatica:   She reports pain in her lower back and buttock and down the left leg   In the past, LBP/buttock pain improved after piriformis muscle injections.  Pain has increased over the last month on the left.  The right back and leg are fine. When intense, the pain will radiate down the left posterior leg.  She has noted some dysesthesias in her left leg (from buttock to ankle).   Gabapentin and hydrocodone have helped the pain in the past.   She takes 1-2 hydrocodones a day if pain flares up  Neck/shoulder pain:   She feels her neck and shoulder pain are not too bad at the current time. In the past, she received benefit from subacromial bursa injections.   Lupus/PMR/Knee pain:   She see [place] and is on Actemra for Lupus and prednisone for PMR  DM:  She has DM but denies numbness in her toes.    Mood/Insomnia:   Insomnia has done better with gabapentin and doxepin at bedtime.Marland Kitchen.         REVIEW OF SYSTEMS: Constitutional: No fevers, chills, sweats, or change in appetite Eyes: No  visual changes, double vision, eye pain Ear, nose and throat: No hearing loss, ear pain, nasal congestion, sore throat Cardiovascular: No chest pain, palpitations Respiratory: No shortness of breath at rest or with exertion.   No wheezes GastrointestinaI: No nausea, vomiting, diarrhea, abdominal pain, fecal  incontinence Genitourinary: No dysuria, urinary retention or frequency.  No nocturia. Musculoskeletal: Mild neck pain.  Notes left shoulder pain and bilat back pain Integumentary: No rash, pruritus, skin lesions Neurological: as above Psychiatric: No depression at this time.  No anxiety Endocrine: No palpitations, diaphoresis, change in appetite, change in weigh or increased thirst Hematologic/Lymphatic: No anemia, purpura, petechiae. Allergic/Immunologic: No itchy/runny eyes, nasal congestion, recent allergic reactions, rashes  ALLERGIES: Allergies  Allergen Reactions  . Canagliflozin Other (See Comments)    Other reaction(s): Other (See Comments) unknown unknown  . Propoxyphene Other (See Comments)    unknown    HOME MEDICATIONS:  Current Outpatient Medications:  .  alendronate (FOSAMAX) 70 MG tablet, Take 70 mg by mouth once a week., Disp: , Rfl: 11 .  amLODipine (NORVASC) 5 MG tablet, Take 5 mg by mouth., Disp: , Rfl:  .  aspirin EC 81 MG tablet, Take 81 mg by mouth daily., Disp: , Rfl:  .  brinzolamide (AZOPT) 1 % ophthalmic suspension, Place 1 drop into both eyes 2 (two) times daily. Frequency:   Dosage:0.0     Instructions:  Note:, Disp: , Rfl:  .  carvedilol (COREG) 25 MG tablet, Take 25 mg by mouth 2 (two) times daily with a meal. , Disp: , Rfl:  .  Cyanocobalamin (VITAMIN B-12 CR) 1000 MCG TBCR, Take 1,000 mcg by mouth daily. Frequency:   Dosage:0.0     Instructions:  Note:, Disp: , Rfl:  .  cyclobenzaprine (FLEXERIL) 5 MG tablet, Take 1 tablet (5 mg total) by mouth 3 (three) times daily as needed. Take 5 mg at bedtime nightly and can take up to 3 a day as needed,  Disp: 9 tablet, Rfl: 0 .  diclofenac sodium (VOLTAREN) 1 % GEL, Apply 2 g topically 3 (three) times daily. CANCEL PRESCRIPTION OF DICLOFENAC GEL, Disp: 200 g, Rfl: 3 .  doxepin (SINEQUAN) 10 MG capsule, Take 1 capsule (10 mg total) by mouth at bedtime. (Patient taking differently: Take 10 mg by mouth at bedtime as needed. ), Disp: 305 capsule, Rfl: 5 .  Ergocalciferol (VITAMIN D2) 2000 UNITS TABS, Take 2,000 Units by mouth daily. Frequency:   Dosage:0     Instructions:  Note:take 5000 iu everyday, Disp: , Rfl:  .  fluticasone (FLONASE) 50 MCG/ACT nasal spray, Place 1 spray into both nostrils daily. Frequency:   Dosage:0.0     Instructions:  Note:, Disp: , Rfl:  .  gabapentin (NEURONTIN) 300 MG capsule, Take 1 capsule in the morning, 1 capsules in the afternoon and 2 capsules at night., Disp: 360 capsule, Rfl: 3 .  glimepiride (AMARYL) 1 MG tablet, TAKE 1 TABLET (1 MG TOTAL) BY MOUTH DAILY BEFORE BREAKFAST., Disp: , Rfl:  .  glucose blood (ONE TOUCH ULTRA TEST) test strip, Use 1 test strip to check blood sugar twice daily, Disp: , Rfl:  .  HYDROcodone-acetaminophen (NORCO) 7.5-325 MG tablet, Take 1 tablet by mouth 2 (two) times daily., Disp: 60 tablet, Rfl: 0 .  Insulin Degludec (TRESIBA) 100 UNIT/ML SOLN, Inject 40 Units into the skin at bedtime., Disp: , Rfl:  .  losartan-hydrochlorothiazide (HYZAAR) 100-12.5 MG per tablet, Take 1 tablet by mouth daily. , Disp: , Rfl:  .  montelukast (SINGULAIR) 10 MG tablet, Take 10 mg by mouth at bedtime. , Disp: , Rfl: 11 .  pantoprazole (PROTONIX) 40 MG tablet, Take 40 mg by mouth 2 (two) times daily before a meal. , Disp: , Rfl:  .  potassium chloride (K-DUR) 10  MEQ tablet, Take 10 mEq by mouth daily. , Disp: , Rfl:  .  predniSONE (DELTASONE) 5 MG tablet, Take 2 tablets (10 mg total) by mouth 2 (two) times daily with a meal., Disp: , Rfl:  .  rosuvastatin (CRESTOR) 10 MG tablet, TAKE 1 TABLET BY MOUTH THREE TIMES A WEEK, Disp: , Rfl:  .  Tocilizumab (ACTEMRA  IV), Inject into the vein every 30 (thirty) days., Disp: , Rfl:  .  trimethoprim (TRIMPEX) 100 MG tablet, Take 100 mg by mouth at bedtime., Disp: , Rfl: 99 .  VENTOLIN HFA 108 (90 Base) MCG/ACT inhaler, Inhale 1 puff into the lungs every 4 (four) hours as needed., Disp: , Rfl: 5 .  cephALEXin (KEFLEX) 500 MG capsule, Take 500 mg by mouth 3 (three) times daily. UTI, Disp: , Rfl:   PAST MEDICAL HISTORY: Past Medical History:  Diagnosis Date  . Asthma   . Diabetes mellitus without complication (HCC)   . Hypertension   . Lupus (systemic lupus erythematosus) (HCC)   . Neuropathy   . Raynaud's disease   . Vertebral fracture, osteoporotic (HCC)     PAST SURGICAL HISTORY: Past Surgical History:  Procedure Laterality Date  . TONSILLECTOMY      FAMILY HISTORY: History reviewed. No pertinent family history.  SOCIAL HISTORY:  Social History   Socioeconomic History  . Marital status: Divorced    Spouse name: Not on file  . Number of children: Not on file  . Years of education: Not on file  . Highest education level: Not on file  Occupational History  . Not on file  Social Needs  . Financial resource strain: Not on file  . Food insecurity    Worry: Not on file    Inability: Not on file  . Transportation needs    Medical: Not on file    Non-medical: Not on file  Tobacco Use  . Smoking status: Never Smoker  . Smokeless tobacco: Never Used  Substance and Sexual Activity  . Alcohol use: No    Alcohol/week: 0.0 standard drinks  . Drug use: No  . Sexual activity: Not on file  Lifestyle  . Physical activity    Days per week: Not on file    Minutes per session: Not on file  . Stress: Not on file  Relationships  . Social Musician on phone: Not on file    Gets together: Not on file    Attends religious service: Not on file    Active member of club or organization: Not on file    Attends meetings of clubs or organizations: Not on file    Relationship status: Not  on file  . Intimate partner violence    Fear of current or ex partner: Not on file    Emotionally abused: Not on file    Physically abused: Not on file    Forced sexual activity: Not on file  Other Topics Concern  . Not on file  Social History Narrative  . Not on file     PHYSICAL EXAM  Vitals:   09/16/18 0949  BP: 122/65  Pulse: 74  Temp: (!) 97.5 F (36.4 C)  Weight: 206 lb 8 oz (93.7 kg)  Height:  (1.6 m)    Body mass index is 36.58 kg/m.   General: The patient is well-developed and well-nourished and in no acute distress  Neck:   Mild tenderness in the cervical paraspinal muscles and trapezius muscles.  Musculoskeletal:  She has moderate tenderness over the lower lumbar paraspinal muscles.  Range of motion is reduced in the back.  Neurologic Exam  Mental status: The patient is alert and oriented x 3 at the time of the examination. The patient has apparent normal recent and remote memory, with an apparently normal attention span and concentration ability.   Speech is normal.  Cranial nerves: Extraocular movements are full.   Facial strength and sensation is normal.  Trapezius strength is normal.  Motor:  Muscle bulk is normal.   Tone is normal. Strength is  5 / 5 in all 4 extremities.   Sensory: Intact sensation to touch in the arms or legs  Gait and station: She needs to use her arms to stand up.  The gait is arthritic.  She is unable to do a tandem walk.  Romberg is negative.  Reflexes: Deep tendon reflexes are symmetric and 1 in arms and absent in legs bilaterally.       DIAGNOSTIC DATA (LABS, IMAGING, TESTING) - I reviewed patient records, labs, notes, testing and imaging myself where available.     ASSESSMENT AND PLAN    1. Chronic bilateral low back pain with bilateral sciatica   2. Neck pain   3. Polymyalgia rheumatica (HCC)   4. Systemic lupus erythematosus, unspecified SLE type, unspecified organ involvement status (HCC)      1.     Bilateral L3--L4 and L4-L5 paraspinal muscle trigger point injections with 80- mg Depomedrol in 5 mL 0.5% Marcaine using sterile technique.. She tolerated the procedure well and there were no complications.   2.   Try to stay active and exercise as tolerated 3.   Continue hydrocodone #60/month.    [facility] was queried and she only gets opiates from our office.  Continue gabapentin and diclofenac (send diclofenac to local drugstore, [facility]) 4    She will return to see me in 4 months,  sooner if she has new or worsening neurologic symptoms.   Nicci Vaughan A. Epimenio FootSater, MD, PhD 09/16/2018, 10:11 AM Certified in Neurology, Clinical Neurophysiology, Sleep Medicine, Pain Medicine and Neuroimaging  [facility] [address], [address] [place], [place] [address] [phone] p

## 2018-09-28 ENCOUNTER — Other Ambulatory Visit: Payer: Self-pay | Admitting: Neurology

## 2018-09-28 NOTE — Telephone Encounter (Signed)
Pt is requesting a refill of HYDROcodone-acetaminophen (NORCO) 7.5-325 MG tablet , to be sent to [id number] - [place], [place] - [address]. AT [address]

## 2018-10-01 MED ORDER — HYDROCODONE-ACETAMINOPHEN 7.5-325 MG PO TABS: 1.0000 | ORAL_TABLET | Freq: Two times a day (BID) | ORAL | 0 refills | Status: DC

## 2018-10-01 NOTE — Telephone Encounter (Signed)
Pt calling in to check status of refill

## 2018-11-03 ENCOUNTER — Other Ambulatory Visit: Payer: Self-pay | Admitting: *Deleted

## 2018-11-03 ENCOUNTER — Other Ambulatory Visit: Payer: Self-pay | Admitting: Neurology

## 2018-11-03 MED ORDER — HYDROCODONE-ACETAMINOPHEN 7.5-325 MG PO TABS: 1.0000 | ORAL_TABLET | Freq: Two times a day (BID) | ORAL | 0 refills | Status: DC

## 2018-11-03 NOTE — Addendum Note (Signed)
Addended by: Hope Pigeon on: 11/03/2018 09:17 AM   Modules accepted: Orders

## 2018-11-03 NOTE — Telephone Encounter (Signed)
Pt has called for a refill on her HYDROcodone-acetaminophen (NORCO) 7.5-325 MG tablet [id number]

## 2018-12-03 ENCOUNTER — Other Ambulatory Visit: Payer: Self-pay | Admitting: Neurology

## 2018-12-03 MED ORDER — HYDROCODONE-ACETAMINOPHEN 7.5-325 MG PO TABS: 1.0000 | ORAL_TABLET | Freq: Two times a day (BID) | ORAL | 0 refills | Status: DC

## 2018-12-03 NOTE — Telephone Encounter (Signed)
Reviewed pt chart. She was last seen 09/16/2018 and next f/u 01/13/2019. Checked drug registry. She last refilled rx 11/03/18 #60.

## 2018-12-03 NOTE — Telephone Encounter (Signed)
Pt is requesting a refill of HYDROcodone-acetaminophen (NORCO) 7.5-325 MG tablet , to be [facility] to [id number] - [place], [place] - [address]. AT [address]

## 2019-01-07 ENCOUNTER — Other Ambulatory Visit: Payer: Self-pay | Admitting: Neurology

## 2019-01-07 MED ORDER — HYDROCODONE-ACETAMINOPHEN 7.5-325 MG PO TABS: 1.0000 | ORAL_TABLET | Freq: Two times a day (BID) | ORAL | 0 refills | Status: DC

## 2019-01-07 NOTE — Telephone Encounter (Signed)
Pt is needing a refill on her HYDROcodone-acetaminophen (NORCO) 7.5-325 MG tablet [facility] [address] [facility]

## 2019-01-13 ENCOUNTER — Ambulatory Visit (INDEPENDENT_AMBULATORY_CARE_PROVIDER_SITE_OTHER): Payer: Medicare Other | Admitting: Neurology

## 2019-01-13 ENCOUNTER — Encounter: Payer: Self-pay | Admitting: Neurology

## 2019-01-13 ENCOUNTER — Other Ambulatory Visit: Payer: Self-pay

## 2019-01-13 VITALS — BP 128/68 | HR 81 | Temp 98.6°F | Ht 63.0 in | Wt 207.3 lb

## 2019-01-13 DIAGNOSIS — G8929 Other chronic pain: Secondary | ICD-10-CM

## 2019-01-13 DIAGNOSIS — M5442 Lumbago with sciatica, left side: Secondary | ICD-10-CM

## 2019-01-13 DIAGNOSIS — M329 Systemic lupus erythematosus, unspecified: Secondary | ICD-10-CM | POA: Diagnosis not present

## 2019-01-13 DIAGNOSIS — M542 Cervicalgia: Secondary | ICD-10-CM | POA: Diagnosis not present

## 2019-01-13 DIAGNOSIS — M5441 Lumbago with sciatica, right side: Secondary | ICD-10-CM | POA: Diagnosis not present

## 2019-01-13 NOTE — Progress Notes (Signed)
[facility]  PATIENT: Kerry Brooks DOB: [birth year]  REFERRING DOCTOR OR PCP:  Brooke Bonito  SOURCE: patient  _________________________________   HISTORICAL  CHIEF COMPLAINT:  Chief Complaint  Patient presents with  . Follow-up    Rm 12 here for 4 month f/u on chronic lower back pain  . Back Pain    Reports back pain and knee pain has been the same to change    HISTORY OF PRESENT ILLNESS:  Kerry Brooks is a 78 y.o. woman with LBP, left shoulder pain and knee pain.    Update 01/13/2019: She continues to have LBP.    Pain is mostly axial.  It is worse with standing and walking/activity.   She would notice stiffness in the back at times.  The TPIs at the last visit helped.  She also has knee pain, worse when she walks.   She also is on hydrocodone with benefit.     Hr DM is doing better with HgbA1c down to 8.0.   She has seen Dr. Dot Been (cardiology) and was told her heart is doing well.    She has SLE.  She denies any neck pain at this time.  Headaches have done better this year.  Update 09/16/18: She reports more lower back pain.    She reports pain is mostly axial.    At the last visit, we did a lumbar paraspinal trigger point injection with som benefit.   She also reports knee pain.    Hydrocodone helps her pain some.    She has been having many UTI's.  She is currently on cephaalexin 500 mg po tid but feels nauseous and has diarrhea.      She sees Dr. Herma Carson for SLE and is on Actemra as a DMT.   However due to frequent UTIs it is being held.    She has Tyoe 2 DM with decent control (HgbA1c 7.6-8.1).   She denies tingling or dysesthesia in feet.  She is following CDC Covid-19 guidelines.   She only leaves her house for doctor's visits and she has some visitors.     Update 05/06/2018: She reports more lower back pain and stiffness.   She has had recurrent UTIs and her lupus medication (Actemra) and steroids were reduced.   Gabapentin, hydrocodone  and diclofenac help some.    TPIs have helped but due to infections she is supposed to avoid prednisone.    She denies any new weakness.      Her left shoulder pain is not too bad though ROM is still reduced.   Her knees still hurt but Voltaren gel and hydrocodone help some.    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   She had joint swelling and felt weaker on her right.   She was placed on prednisone and went to rehab.   She remains on Actemra infusions.  Sugars were elevated.  Her statin was d/c.   She saw Urology this week and was placed on Cipro for a UTI.   She is doing some PT.  Worse pain is in her lower back currently.   It is usually just lumbar but occasionally there is some radiation down the legs.   Her hip pain is better since the steroids.   Hydrocodone has helped the pain some.  She takes up to 2 hydrocodones a day.    Her neck  pain is doing better since changing pillows.    Gabapentin  Has also helped her pain and she tolerates it well.    Update 09/16/2017: Her worse pain is in the lower back across both sides into the buttocks.  Pain is worse with walking.    TPI's have helped in the past.    Currently L = R.    She had a lot of neck pain and headache, right > left.   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   She was diagnosed with occipital neuralgia.    She continues to note bilateral shoulder pain, left > right.   She had a recent bilateral knee injection and she noted her sugars increased over 300.     IMPRESSION: 1. Minimal grade 1 C4-5 anterolisthesis on a degenerative basis. No acute osseous process. 2. Mild low-grade upper cervical paraspinal muscle strain. 3. Mild canal stenosis at C5-6 in part due to 3 x 4 x 6 mm contiguous disc extrusion which mildly deforms the ventral spinal cord. Mild canal stenosis C3-4. 4. Neural foraminal narrowing C3-4 through C6-7:  Severe on the left at C3-4, severe on the right at C4-5.  She is reporting she is sleeping better with gabapentin and doxepin.    Flexeril in the past made her too sleepy.  Update 05/14/2017: She is reporting more pain in the left shoulder and the right > left buttock.   Shoulder pain is worse with external rotation and elevation.     She does not note arm weakness (but limited by pain).   No radiation of pain into the arm.    The buttock pain is going a little lower but not much into the leg.  She has had generalized muscle aches that worsened when she went on Crestor and improved when she stropped.     Insomnia is better with gabapentin and doxepin  Update 01/02/2017:    Currently,  the worse pain is across her back.  Pain increases with chores/exertion.    The pain is generally reduced when she is sitting. This is similar to pain that she has had in the past that improved with trigger point injections. When the pain is more severe she is taking the hydrocodone, usually 2 times a day.  She also reprorts pain in her left shoulder.  Pain increases with lifting and external rotation.   The pain is improved if the shoulder is relaxed and she holds her arm into her chest. This is similar to pain that she has experienced in the past that improved with a subacromial bursa injection.  Volltaren gel helped but was expensive  Insomnia has done better with a combination of gabapentin and doxepin. She tolerates these medications well.  -  From 09/10/2016: Left sided sciatica:   She reports pain in her lower back and buttock and down the left leg   In the past, LBP/buttock pain improved after piriformis muscle injections.  Pain has increased over the last month on the left.  The right back and leg are fine. When intense, the pain will radiate down the left posterior leg.  She has noted some dysesthesias in her left leg (from buttock to ankle).   Gabapentin and hydrocodone have helped the pain in the past.   She  takes 1-2 hydrocodones a day if pain flares up  Neck/shoulder pain:   She feels her neck and shoulder pain are not too bad at the current time. In the past,  she received benefit from subacromial bursa injections.   Lupus/PMR/Knee pain:   She see Dr Salvadore Oxford in [facility] and is on Actemra for Lupus and prednisone for PMR  DM:  She has DM but denies numbness in her toes.    Mood/Insomnia:   Insomnia has done better with gabapentin and doxepin at bedtime.Marland Kitchen         REVIEW OF SYSTEMS: Constitutional: No fevers, chills, sweats, or change in appetite Eyes: No visual changes, double vision, eye pain Ear, nose and throat: No hearing loss, ear pain, nasal congestion, sore throat Cardiovascular: No chest pain, palpitations Respiratory: No shortness of breath at rest or with exertion.   No wheezes GastrointestinaI: No nausea, vomiting, diarrhea, abdominal pain, fecal incontinence Genitourinary: No dysuria, urinary retention or frequency.  No nocturia. Musculoskeletal: Mild neck pain.  Notes left shoulder pain and bilat back pain Integumentary: No rash, pruritus, skin lesions Neurological: as above Psychiatric: No depression at this time.  No anxiety Endocrine: No palpitations, diaphoresis, change in appetite, change in weigh or increased thirst Hematologic/Lymphatic: No anemia, purpura, petechiae. Allergic/Immunologic: No itchy/runny eyes, nasal congestion, recent allergic reactions, rashes  ALLERGIES: Allergies  Allergen Reactions  . Canagliflozin Other (See Comments)    Other reaction(s): Other (See Comments) unknown unknown  . Propoxyphene Other (See Comments)    unknown    HOME MEDICATIONS:  Current Outpatient Medications:  .  alendronate (FOSAMAX) 70 MG tablet, Take 70 mg by mouth once a week., Disp: , Rfl: 11 .  amLODipine (NORVASC) 5 MG tablet, Take 5 mg by mouth., Disp: , Rfl:  .  aspirin EC 81 MG tablet, Take 81 mg by mouth daily., Disp: , Rfl:  .  brinzolamide (AZOPT) 1 %  ophthalmic suspension, Place 1 drop into both eyes 2 (two) times daily. Frequency:   Dosage:0.0     Instructions:  Note:, Disp: , Rfl:  .  carvedilol (COREG) 25 MG tablet, Take 25 mg by mouth 2 (two) times daily with a meal. , Disp: , Rfl:  .  Cyanocobalamin (VITAMIN B-12 CR) 1000 MCG TBCR, Take 1,000 mcg by mouth daily. Frequency:   Dosage:0.0     Instructions:  Note:, Disp: , Rfl:  .  cyclobenzaprine (FLEXERIL) 5 MG tablet, Take 1 tablet (5 mg total) by mouth 3 (three) times daily as needed. Take 5 mg at bedtime nightly and can take up to 3 a day as needed, Disp: 9 tablet, Rfl: 0 .  diclofenac sodium (VOLTAREN) 1 % GEL, Apply 2 g topically 3 (three) times daily. CANCEL PRESCRIPTION OF DICLOFENAC GEL, Disp: 200 g, Rfl: 3 .  doxepin (SINEQUAN) 10 MG capsule, Take 1 capsule (10 mg total) by mouth at bedtime. (Patient taking differently: Take 10 mg by mouth at bedtime as needed. ), Disp: 305 capsule, Rfl: 5 .  Ergocalciferol (VITAMIN D2) 2000 UNITS TABS, Take 2,000 Units by mouth daily. Frequency:   Dosage:0     Instructions:  Note:take 5000 iu everyday, Disp: , Rfl:  .  fluticasone (FLONASE) 50 MCG/ACT nasal spray, Place 1 spray into both nostrils daily. Frequency:   Dosage:0.0     Instructions:  Note:, Disp: , Rfl:  .  gabapentin (NEURONTIN) 300 MG capsule, Take 1 capsule in the morning, 1 capsules in the afternoon and 2 capsules at night., Disp: 360 capsule, Rfl: 3 .  glimepiride (AMARYL) 1 MG tablet, TAKE 1 TABLET (1 MG TOTAL) BY MOUTH DAILY BEFORE BREAKFAST., Disp: , Rfl:  .  glucose blood (ONE TOUCH ULTRA TEST)  test strip, Use 1 test strip to check blood sugar twice daily, Disp: , Rfl:  .  HYDROcodone-acetaminophen (NORCO) 7.5-325 MG tablet, Take 1 tablet by mouth 2 (two) times daily., Disp: 60 tablet, Rfl: 0 .  Insulin Degludec (TRESIBA) 100 UNIT/ML SOLN, Inject 40 Units into the skin at bedtime., Disp: , Rfl:  .  losartan-hydrochlorothiazide (HYZAAR) 100-12.5 MG per tablet, Take 1 tablet by mouth  daily. , Disp: , Rfl:  .  montelukast (SINGULAIR) 10 MG tablet, Take 10 mg by mouth at bedtime. , Disp: , Rfl: 11 .  pantoprazole (PROTONIX) 40 MG tablet, Take 40 mg by mouth 2 (two) times daily before a meal. , Disp: , Rfl:  .  potassium chloride (K-DUR) 10 MEQ tablet, Take 10 mEq by mouth daily. , Disp: , Rfl:  .  predniSONE (DELTASONE) 5 MG tablet, Take 2 tablets (10 mg total) by mouth 2 (two) times daily with a meal., Disp: , Rfl:  .  rosuvastatin (CRESTOR) 10 MG tablet, TAKE 1 TABLET BY MOUTH THREE TIMES A WEEK, Disp: , Rfl:  .  Tocilizumab (ACTEMRA IV), Inject into the vein every 30 (thirty) days., Disp: , Rfl:  .  trimethoprim (TRIMPEX) 100 MG tablet, Take 100 mg by mouth at bedtime., Disp: , Rfl: 99 .  VENTOLIN HFA 108 (90 Base) MCG/ACT inhaler, Inhale 1 puff into the lungs every 4 (four) hours as needed., Disp: , Rfl: 5  PAST MEDICAL HISTORY: Past Medical History:  Diagnosis Date  . Asthma   . Diabetes mellitus without complication (HCC)   . Hypertension   . Lupus (systemic lupus erythematosus) (HCC)   . Neuropathy   . Raynaud's disease   . Vertebral fracture, osteoporotic (HCC)     PAST SURGICAL HISTORY: Past Surgical History:  Procedure Laterality Date  . TONSILLECTOMY      FAMILY HISTORY: History reviewed. No pertinent family history.  SOCIAL HISTORY:  Social History   Socioeconomic History  . Marital status: Divorced    Spouse name: Not on file  . Number of children: Not on file  . Years of education: Not on file  . Highest education level: Not on file  Occupational History  . Not on file  Social Needs  . Financial resource strain: Not on file  . Food insecurity    Worry: Not on file    Inability: Not on file  . Transportation needs    Medical: Not on file    Non-medical: Not on file  Tobacco Use  . Smoking status: Never Smoker  . Smokeless tobacco: Never Used  Substance and Sexual Activity  . Alcohol use: No    Alcohol/week: 0.0 standard drinks   . Drug use: No  . Sexual activity: Not on file  Lifestyle  . Physical activity    Days per week: Not on file    Minutes per session: Not on file  . Stress: Not on file  Relationships  . Social Musician on phone: Not on file    Gets together: Not on file    Attends religious service: Not on file    Active member of club or organization: Not on file    Attends meetings of clubs or organizations: Not on file    Relationship status: Not on file  . Intimate partner violence    Fear of current or ex partner: Not on file    Emotionally abused: Not on file    Physically abused: Not on file  Forced sexual activity: Not on file  Other Topics Concern  . Not on file  Social History Narrative  . Not on file     PHYSICAL EXAM  Vitals:   01/13/19 1131  BP: 128/68  Pulse: 81  Temp: 98.6 F (37 C)  TempSrc: Temporal  Weight: 207 lb 5 oz (94 kg)  Height: 5\' 3"  (1.6 m)    Body mass index is 36.72 kg/m.   General: The patient is well-developed and well-nourished and in no acute distress  Neck:   Mild tenderness in the cervical paraspinal muscles and trapezius muscles.  Musculoskeletal:   She has moderate tenderness over the lower lumbar paraspinal muscles.  Range of motion is reduced in the back.  Neurologic Exam  Mental status: The patient is alert and oriented x 3 at the time of the examination. The patient has apparent normal recent and remote memory, with an apparently normal attention span and concentration ability.   Speech is normal.  Cranial nerves: Extraocular movements are full.   Facial strength and sensation is normal.  Trapezius strength is normal.  Motor:  Muscle bulk is normal.   Tone is normal. Strength is  5 / 5 in all 4 extremities.   Sensory: Intact sensation to touch in the arms or legs  Gait and station: She needs to use her arms to stand up.  The gait is arthritic.  She is unable to do a tandem walk.  Romberg is negative.  Reflexes: Deep  tendon reflexes are symmetric and 1 in arms and absent in legs bilaterally.       DIAGNOSTIC DATA (LABS, IMAGING, TESTING) - I reviewed patient records, labs, notes, testing and imaging myself where available.     ASSESSMENT AND PLAN    1. Chronic bilateral low back pain with bilateral sciatica   2. Systemic lupus erythematosus, unspecified SLE type, unspecified organ involvement status (HCC)   3. Neck pain      1.    Bilateral L3--L4 and L4-L5 paraspinal muscle trigger point injections with 40 mg Depomedrol in 4 mL 0.5% Marcaine using sterile technique.. She tolerated the procedure well and there were no complications.   2.   Try to stay active and exercise as tolerated 3.   Continue hydrocodone #60/month.   She has recent refill.    [facility] was queried and she only gets opiates from our office.  Continue gabapentin and diclofenac  4    She will return to see me in 4 months,  sooner if she has new or worsening neurologic symptoms.    A. , MD, PhD 01/13/2019, 2:26 PM Certified in Neurology, Clinical Neurophysiology, Sleep Medicine, Pain Medicine and Neuroimaging  [facility] [address] [place], [place] [phone] p

## 2019-02-04 ENCOUNTER — Other Ambulatory Visit: Payer: Self-pay | Admitting: Neurology

## 2019-02-04 MED ORDER — HYDROCODONE-ACETAMINOPHEN 7.5-325 MG PO TABS: 1.0000 | ORAL_TABLET | Freq: Two times a day (BID) | ORAL | 0 refills | Status: DC

## 2019-02-04 NOTE — Telephone Encounter (Signed)
Pt is requesting a refill of HYDROcodone-acetaminophen (NORCO) 7.5-325 MG tablet, to be sent to [id number] - [place], [place] - [address]. AT [address]

## 2019-03-09 ENCOUNTER — Other Ambulatory Visit: Payer: Self-pay | Admitting: Neurology

## 2019-03-15 ENCOUNTER — Other Ambulatory Visit: Payer: Self-pay

## 2019-03-15 MED ORDER — HYDROCODONE-ACETAMINOPHEN 7.5-325 MG PO TABS: 1.0000 | ORAL_TABLET | Freq: Two times a day (BID) | ORAL | 0 refills | Status: DC

## 2019-03-15 NOTE — Telephone Encounter (Signed)
1) Medication(s) Requested (by name): °HYDROcodone-acetaminophen (NORCO) 7.5-325 MG tablet  ° °2) Pharmacy of Choice: °[id number] - [place], [place] - [address]  °[address] [place] [address]  ° ° ° ° °

## 2019-03-17 ENCOUNTER — Telehealth: Payer: Self-pay | Admitting: Neurology

## 2019-03-17 NOTE — Telephone Encounter (Signed)
Pt called wanting to know if it is ok for her to get the COVID Vaccine with all the medications that she is on. Please advise.

## 2019-03-17 NOTE — Telephone Encounter (Signed)
I called pt back. Advised per Dr. Epimenio Foot, she is ok to get either the [facility] or Pfizer covid-19 vaccine. She is scheduled for 03/23/19 to receive vaccine.

## 2019-04-22 ENCOUNTER — Other Ambulatory Visit: Payer: Self-pay | Admitting: Neurology

## 2019-04-22 MED ORDER — HYDROCODONE-ACETAMINOPHEN 7.5-325 MG PO TABS: 1.0000 | ORAL_TABLET | Freq: Two times a day (BID) | ORAL | 0 refills | Status: DC

## 2019-04-22 NOTE — Telephone Encounter (Signed)
1) Medication(s) Requested (by name): HYDROcodone-acetaminophen (NORCO) 7.5-325 MG tablet   2) Pharmacy of Choice: [id number] - [place], [place] - [address]  [address] [place] [address]

## 2019-05-17 ENCOUNTER — Encounter: Payer: Self-pay | Admitting: Neurology

## 2019-05-17 ENCOUNTER — Ambulatory Visit (INDEPENDENT_AMBULATORY_CARE_PROVIDER_SITE_OTHER): Payer: Medicare Other | Admitting: Neurology

## 2019-05-17 ENCOUNTER — Other Ambulatory Visit: Payer: Self-pay

## 2019-05-17 VITALS — BP 171/81 | HR 84 | Temp 97.4°F | Ht 63.0 in | Wt 213.0 lb

## 2019-05-17 DIAGNOSIS — M5431 Sciatica, right side: Secondary | ICD-10-CM

## 2019-05-17 DIAGNOSIS — M5440 Lumbago with sciatica, unspecified side: Secondary | ICD-10-CM

## 2019-05-17 DIAGNOSIS — E1142 Type 2 diabetes mellitus with diabetic polyneuropathy: Secondary | ICD-10-CM | POA: Diagnosis not present

## 2019-05-17 DIAGNOSIS — M5432 Sciatica, left side: Secondary | ICD-10-CM | POA: Diagnosis not present

## 2019-05-17 DIAGNOSIS — Z794 Long term (current) use of insulin: Secondary | ICD-10-CM

## 2019-05-17 MED ORDER — HYDROCODONE-ACETAMINOPHEN 7.5-325 MG PO TABS: 1.0000 | ORAL_TABLET | Freq: Two times a day (BID) | ORAL | 0 refills | Status: DC

## 2019-05-17 NOTE — Progress Notes (Signed)
[facility]  PATIENT: Kerry Brooks DOB: [birth year]  REFERRING DOCTOR OR PCP:  Brooke Bonito  SOURCE: patient  _________________________________   HISTORICAL  CHIEF COMPLAINT:  Chief Complaint  Patient presents with  . Follow-up    RM 12, alone. Last seen 01/13/2019. Ambulates with cane. Wants info on occipital neuralgia. Wants this listed in her chart. Saw Frances Furbish, endrocronlogist who looked at mole at top of her head.     HISTORY OF PRESENT ILLNESS:  Kerry Brooks is a 79 y.o. woman with LBP, left shoulder pain and knee pain.    Update 05/17/2019: She reports axial back pain.  It is worse with standing and walking/activity.   She would notice stiffness in the back at times.  The TPIs at the last visit helped.  The trigger points were in the lower lumbar paraspinal muscles..  She also has knee pain, worse when she walks.   She also is on hydrocodone with benefit.     She has had more knee pain and is concerned about an SLE flare.   She sees Dr. Sharmon Revere.   She also has DM and reports  Sugars were a little low so insulin was redcued.  She denies numbness or tingling in the feet though does have mild reduced sensation on exam.  She reports that when she had her haircut recently she was told she had a mole on the scalp.  She does not not recall being told about this in the past.  She has completed the COVID-19 vaccinations.  She tolerated them well.  Update 01/13/2019: She continues to have LBP.    Pain is mostly axial.  It is worse with standing and walking/activity.   She would notice stiffness in the back at times.  The TPIs at the last visit helped.  She also has knee pain, worse when she walks.   She also is on hydrocodone with benefit.     Hr DM is doing better with HgbA1c down to 8.0.   She has seen Dr. Dot Been (cardiology) and was told her heart is doing well.    She has SLE.  She denies any neck pain at this time.  Headaches have done  better this year.  Update 09/16/18: She reports more lower back pain.    She reports pain is mostly axial.    At the last visit, we did a lumbar paraspinal trigger point injection with som benefit.   She also reports knee pain.    Hydrocodone helps her pain some.    She has been having many UTI's.  She is currently on cephaalexin 500 mg po tid but feels nauseous and has diarrhea.      She sees Dr. Herma Carson for SLE and is on Actemra as a DMT.   However due to frequent UTIs it is being held.    She has Tyoe 2 DM with decent control (HgbA1c 7.6-8.1).   She denies tingling or dysesthesia in feet.  She is following CDC Covid-19 guidelines.   She only leaves her house for doctor's visits and she has some visitors.     Update 05/06/2018: She reports more lower back pain and stiffness.   She has had recurrent UTIs and her lupus medication (Actemra) and steroids were reduced.   Gabapentin, hydrocodone and diclofenac help some.    TPIs have helped but due to infections she is supposed to avoid prednisone.    She denies any new weakness.  Her left shoulder pain is not too bad though ROM is still reduced.   Her knees still hurt but Voltaren gel and hydrocodone help some.    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   She had joint swelling and felt weaker on her right.   She was placed on prednisone and went to rehab.   She remains on Actemra infusions.  Sugars were elevated.  Her statin was d/c.   She saw Urology this week and was placed on Cipro for a UTI.   She is doing some PT.  Worse pain is in her lower back currently.   It is usually just lumbar but occasionally there is some radiation down the legs.   Her hip pain is better since the steroids.   Hydrocodone has helped the pain some.  She takes up to 2 hydrocodones a day.    Her neck pain is doing better since changing pillows.    Gabapentin  Has also helped her pain  and she tolerates it well.    Update 09/16/2017: Her worse pain is in the lower back across both sides into the buttocks.  Pain is worse with walking.    TPI's have helped in the past.    Currently L = R.    She had a lot of neck pain and headache, right > left.   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   She was diagnosed with occipital neuralgia.    She continues to note bilateral shoulder pain, left > right.   She had a recent bilateral knee injection and she noted her sugars increased over 300.     IMPRESSION: 1. Minimal grade 1 C4-5 anterolisthesis on a degenerative basis. No acute osseous process. 2. Mild low-grade upper cervical paraspinal muscle strain. 3. Mild canal stenosis at C5-6 in part due to 3 x 4 x 6 mm contiguous disc extrusion which mildly deforms the ventral spinal cord. Mild canal stenosis C3-4. 4. Neural foraminal narrowing C3-4 through C6-7: Severe on the left at C3-4, severe on the right at C4-5.  She is reporting she is sleeping better with gabapentin and doxepin.    Flexeril in the past made her too sleepy.  Update 05/14/2017: She is reporting more pain in the left shoulder and the right > left buttock.   Shoulder pain is worse with external rotation and elevation.     She does not note arm weakness (but limited by pain).   No radiation of pain into the arm.    The buttock pain is going a little lower but not much into the leg.  She has had generalized muscle aches that worsened when she went on Crestor and improved when she stropped.     Insomnia is better with gabapentin and doxepin  Update 01/02/2017:    Currently,  the worse pain is across her back.  Pain increases with chores/exertion.    The pain is generally reduced when she is sitting. This is similar to pain that she has had in the past that improved with trigger point injections. When the pain is more severe she is taking the hydrocodone, usually 2 times a day.  She also reprorts pain in  her left shoulder.  Pain increases with lifting and external rotation.   The pain is improved if the shoulder is relaxed and she holds her arm into her  chest. This is similar to pain that she has experienced in the past that improved with a subacromial bursa injection.  Volltaren gel helped but was expensive  Insomnia has done better with a combination of gabapentin and doxepin. She tolerates these medications well.  -  From 09/10/2016: Left sided sciatica:   She reports pain in her lower back and buttock and down the left leg   In the past, LBP/buttock pain improved after piriformis muscle injections.  Pain has increased over the last month on the left.  The right back and leg are fine. When intense, the pain will radiate down the left posterior leg.  She has noted some dysesthesias in her left leg (from buttock to ankle).   Gabapentin and hydrocodone have helped the pain in the past.   She takes 1-2 hydrocodones a day if pain flares up  Neck/shoulder pain:   She feels her neck and shoulder pain are not too bad at the current time. In the past, she received benefit from subacromial bursa injections.   Lupus/PMR/Knee pain:   She see Dr Luane School in [facility] and is on Actemra for Lupus and prednisone for PMR  DM:  She has DM but denies numbness in her toes.    Mood/Insomnia:   Insomnia has done better with gabapentin and doxepin at bedtime.Marland Kitchen         REVIEW OF SYSTEMS: Constitutional: No fevers, chills, sweats, or change in appetite Eyes: No visual changes, double vision, eye pain Ear, nose and throat: No hearing loss, ear pain, nasal congestion, sore throat Cardiovascular: No chest pain, palpitations Respiratory: No shortness of breath at rest or with exertion.   No wheezes GastrointestinaI: No nausea, vomiting, diarrhea, abdominal pain, fecal incontinence Genitourinary: No dysuria, urinary retention or frequency.  No nocturia. Musculoskeletal: Mild neck pain.  Notes left shoulder pain and bilat  back pain Integumentary: No rash, pruritus, skin lesions Neurological: as above Psychiatric: No depression at this time.  No anxiety Endocrine: No palpitations, diaphoresis, change in appetite, change in weigh or increased thirst Hematologic/Lymphatic: No anemia, purpura, petechiae. Allergic/Immunologic: No itchy/runny eyes, nasal congestion, recent allergic reactions, rashes  ALLERGIES: Allergies  Allergen Reactions  . Canagliflozin Other (See Comments)    Other reaction(s): Other (See Comments) unknown unknown  . Propoxyphene Other (See Comments)    unknown    HOME MEDICATIONS:  Current Outpatient Medications:  .  alendronate (FOSAMAX) 70 MG tablet, Take 70 mg by mouth once a week., Disp: , Rfl: 11 .  amLODipine (NORVASC) 5 MG tablet, Take 5 mg by mouth., Disp: , Rfl:  .  aspirin EC 81 MG tablet, Take 81 mg by mouth daily., Disp: , Rfl:  .  carvedilol (COREG) 25 MG tablet, Take 25 mg by mouth 2 (two) times daily with a meal. , Disp: , Rfl:  .  Cyanocobalamin (VITAMIN B-12 CR) 1000 MCG TBCR, Take 1,000 mcg by mouth daily. Frequency:   Dosage:0.0     Instructions:  Note:, Disp: , Rfl:  .  cyclobenzaprine (FLEXERIL) 5 MG tablet, Take 1 tablet (5 mg total) by mouth 3 (three) times daily as needed. Take 5 mg at bedtime nightly and can take up to 3 a day as needed, Disp: 9 tablet, Rfl: 0 .  diclofenac sodium (VOLTAREN) 1 % GEL, Apply 2 g topically 3 (three) times daily. CANCEL PRESCRIPTION OF DICLOFENAC GEL, Disp: 200 g, Rfl: 3 .  diclofenac Sodium (VOLTAREN) 1 % GEL, APPLY 2 G TOPICALLY 3 (THREE) TIMES DAILY. FREQUENCY:  DOSAGE:0.0 INSTRUCTIONS: NOTE:, Disp: 200 g, Rfl: 1 .  dorzolamide (TRUSOPT) 2 % ophthalmic solution, Place 1 drop into both eyes 2 (two) times daily., Disp: , Rfl:  .  doxepin (SINEQUAN) 10 MG capsule, Take 1 capsule (10 mg total) by mouth at bedtime. (Patient taking differently: Take 10 mg by mouth at bedtime as needed. ), Disp: 305 capsule, Rfl: 5 .  Ergocalciferol  (VITAMIN D2) 2000 UNITS TABS, Take 2,000 Units by mouth daily. Frequency:   Dosage:0     Instructions:  Note:take 5000 iu everyday, Disp: , Rfl:  .  fluticasone (FLONASE) 50 MCG/ACT nasal spray, Place 1 spray into both nostrils daily. Frequency:   Dosage:0.0     Instructions:  Note:, Disp: , Rfl:  .  gabapentin (NEURONTIN) 300 MG capsule, Take 1 capsule in the morning, 1 capsules in the afternoon and 2 capsules at night., Disp: 360 capsule, Rfl: 3 .  glimepiride (AMARYL) 1 MG tablet, TAKE 1 TABLET (1 MG TOTAL) BY MOUTH DAILY BEFORE BREAKFAST., Disp: , Rfl:  .  glucose blood (ONE TOUCH ULTRA TEST) test strip, Use 1 test strip to check blood sugar twice daily, Disp: , Rfl:  .  HYDROcodone-acetaminophen (NORCO) 7.5-325 MG tablet, Take 1 tablet by mouth 2 (two) times daily., Disp: 60 tablet, Rfl: 0 .  Insulin Degludec (TRESIBA) 100 UNIT/ML SOLN, Inject 40 Units into the skin at bedtime., Disp: , Rfl:  .  losartan-hydrochlorothiazide (HYZAAR) 100-12.5 MG per tablet, Take 1 tablet by mouth daily. , Disp: , Rfl:  .  montelukast (SINGULAIR) 10 MG tablet, Take 10 mg by mouth at bedtime. , Disp: , Rfl: 11 .  pantoprazole (PROTONIX) 40 MG tablet, Take 40 mg by mouth 2 (two) times daily before a meal. , Disp: , Rfl:  .  potassium chloride (K-DUR) 10 MEQ tablet, Take 10 mEq by mouth daily. , Disp: , Rfl:  .  predniSONE (DELTASONE) 5 MG tablet, Take 2 tablets (10 mg total) by mouth 2 (two) times daily with a meal., Disp: , Rfl:  .  rosuvastatin (CRESTOR) 10 MG tablet, TAKE 1 TABLET BY MOUTH THREE TIMES A WEEK, Disp: , Rfl:  .  Tocilizumab (ACTEMRA IV), Inject into the vein every 30 (thirty) days., Disp: , Rfl:  .  trimethoprim (TRIMPEX) 100 MG tablet, Take 100 mg by mouth at bedtime., Disp: , Rfl: 99 .  VENTOLIN HFA 108 (90 Base) MCG/ACT inhaler, Inhale 1 puff into the lungs every 4 (four) hours as needed., Disp: , Rfl: 5  PAST MEDICAL HISTORY: Past Medical History:  Diagnosis Date  . Asthma   . Diabetes  mellitus without complication (HCC)   . Hypertension   . Lupus (systemic lupus erythematosus) (HCC)   . Neuropathy   . Raynaud's disease   . Vertebral fracture, osteoporotic (HCC)     PAST SURGICAL HISTORY: Past Surgical History:  Procedure Laterality Date  . TONSILLECTOMY      FAMILY HISTORY: No family history on file.  SOCIAL HISTORY:  Social History   Socioeconomic History  . Marital status: Divorced    Spouse name: Not on file  . Number of children: Not on file  . Years of education: Not on file  . Highest education level: Not on file  Occupational History  . Not on file  Tobacco Use  . Smoking status: Never Smoker  . Smokeless tobacco: Never Used  Substance and Sexual Activity  . Alcohol use: No    Alcohol/week: 0.0 standard drinks  . Drug use:  No  . Sexual activity: Not on file  Other Topics Concern  . Not on file  Social History Narrative  . Not on file   Social Determinants of Health   Financial Resource Strain:   . Difficulty of Paying Living Expenses:   Food Insecurity:   . Worried About Programme researcher, broadcasting/film/videounning Out of Food in the Last Year:   . Baristaan Out of Food in the Last Year:   Transportation Needs:   . Freight forwarderLack of Transportation (Medical):   Marland Kitchen. Lack of Transportation (Non-Medical):   Physical Activity:   . Days of Exercise per Week:   . Minutes of Exercise per Session:   Stress:   . Feeling of Stress :   Social Connections:   . Frequency of Communication with Friends and Family:   . Frequency of Social Gatherings with Friends and Family:   . Attends Religious Services:   . Active Member of Clubs or Organizations:   . Attends BankerClub or Organization Meetings:   Marland Kitchen. Marital Status:   Intimate Partner Violence:   . Fear of Current or Ex-Partner:   . Emotionally Abused:   Marland Kitchen. Physically Abused:   . Sexually Abused:      PHYSICAL EXAM  Vitals:   05/17/19 1125  BP: (!) 171/81  Pulse: 84  Temp: (!) 97.4 F (36.3 C)  Weight: 213 lb (96.6 kg)  Height: 5\' 3"   (1.6 m)    Body mass index is 37.73 kg/m.   General: The patient is well-developed and well-nourished and in no acute distress.  There is a dark mole in the midline scalp.  She does not recall being told about this before a recent hair appointment.  Neck:   Mild tenderness in the cervical paraspinal muscles and trapezius muscles.  Musculoskeletal:   She has moderate tenderness over the lower lumbar paraspinal muscles.  Range of motion is reduced in the back.  Neurologic Exam  Mental status: The patient is alert and oriented x 3 at the time of the examination. The patient has apparent normal recent and remote memory, with an apparently normal attention span and concentration ability.   Speech is normal.  Cranial nerves: Extraocular movements are full.   Facial strength and sensation is normal.  Trapezius strength is normal.  Motor:  Muscle bulk is normal.   Tone is normal. Strength is  5 / 5 in all 4 extremities.   Sensory: Intact sensation to touch in the arms and proximal legs.  She has mildly reduced vibration sensation in the feet.  Gait and station: She needs to use her arms to stand up.  The gait is arthritic.  She is unable to do a tandem walk.  Romberg is negative.  Reflexes: Deep tendon reflexes are symmetric and 1 in arms and absent in legs bilaterally.      ____________________________________________  ASSESSMENT AND PLAN    1. Bilateral sciatica   2. Bilateral low back pain with sciatica, sciatica laterality unspecified, unspecified chronicity   3. Type 2 diabetes mellitus with diabetic polyneuropathy, with long-term current use of insulin (HCC)      1.    Bilateral L3--L4 and L4-L5 paraspinal muscle trigger point injections with 40 mg Depomedrol in 4 mL 0.5% Marcaine using sterile technique.. She tolerated the procedure well and there were no complications.   2.   Try to stay active and exercise as tolerated. 3.   Continue hydrocodone #60/month.   I sent ina refill  for 05/22/2019.    [facility]  was queried and she only gets opiates from our office.  Continue gabapentin and diclofenac  4    She reports a mole on her scalp and is not sure how long it has been present.   Advised to see PCP or  Dermatology. 5.  She will return to see me in 4 months,  sooner if she has new or worsening neurologic symptoms.   Kamaal Cast A. Epimenio Foot, MD, PhD 05/17/2019, 12:20 PM Certified in Neurology, Clinical Neurophysiology, Sleep Medicine, Pain Medicine and Neuroimaging  [facility] [address], [address] [place], [place] [address] [phone] p

## 2019-06-22 ENCOUNTER — Other Ambulatory Visit: Payer: Self-pay | Admitting: Neurology

## 2019-06-22 MED ORDER — HYDROCODONE-ACETAMINOPHEN 7.5-325 MG PO TABS: 1.0000 | ORAL_TABLET | Freq: Two times a day (BID) | ORAL | 0 refills | Status: DC

## 2019-06-22 NOTE — Addendum Note (Signed)
Addended by: Arther Abbott on: 06/22/2019 10:02 AM   Modules accepted: Orders

## 2019-06-22 NOTE — Telephone Encounter (Signed)
Pt has called for a refill on her HYDROcodone-acetaminophen (NORCO) 7.5-325 MG tablet  [id number] Pt also asked the Dr Epimenio Foot be aware that re: the mole on the top of her head she did see a Dermatologist(at the [facility]) on yesterday and she will f/u with that office again to ensure it is not cancerous.

## 2019-07-23 ENCOUNTER — Telehealth: Payer: Self-pay | Admitting: Neurology

## 2019-07-23 MED ORDER — HYDROCODONE-ACETAMINOPHEN 7.5-325 MG PO TABS: 1.0000 | ORAL_TABLET | Freq: Two times a day (BID) | ORAL | 0 refills | Status: DC

## 2019-07-23 NOTE — Telephone Encounter (Signed)
Refill hydrocodone  

## 2019-07-30 ENCOUNTER — Other Ambulatory Visit: Payer: Self-pay | Admitting: Neurology

## 2019-08-23 ENCOUNTER — Other Ambulatory Visit: Payer: Self-pay | Admitting: Neurology

## 2019-08-23 MED ORDER — GABAPENTIN 300 MG PO CAPS: ORAL_CAPSULE | ORAL | 3 refills | Status: DC

## 2019-08-23 MED ORDER — HYDROCODONE-ACETAMINOPHEN 7.5-325 MG PO TABS: 1.0000 | ORAL_TABLET | Freq: Two times a day (BID) | ORAL | 0 refills | Status: DC

## 2019-08-23 NOTE — Telephone Encounter (Signed)
Refill on gabapentin sent to [facility].  Pt is up to date on her appts and is due for a refill on norco. [place] Controlled Substance Registry checked and is appropriate.

## 2019-08-23 NOTE — Addendum Note (Signed)
Addended by: Geronimo Running A on: 08/23/2019 11:29 AM   Modules accepted: Orders

## 2019-08-23 NOTE — Telephone Encounter (Signed)
Pt has called for a refill on her  gabapentin (NEURONTIN) 300 MG capsule to [facility] HYDROcodone-acetaminophen (NORCO) 7.5-325 MG tablet to  [id number]

## 2019-08-23 NOTE — Telephone Encounter (Signed)
I called pt. I advised her that [facility]. She [facility] not [facility] but [facility].

## 2019-09-16 ENCOUNTER — Encounter: Payer: Self-pay | Admitting: Neurology

## 2019-09-16 ENCOUNTER — Other Ambulatory Visit: Payer: Self-pay

## 2019-09-16 ENCOUNTER — Ambulatory Visit (INDEPENDENT_AMBULATORY_CARE_PROVIDER_SITE_OTHER): Payer: Medicare Other | Admitting: Neurology

## 2019-09-16 VITALS — BP 178/88 | HR 75 | Ht 63.0 in | Wt 207.5 lb

## 2019-09-16 DIAGNOSIS — M25512 Pain in left shoulder: Secondary | ICD-10-CM | POA: Diagnosis not present

## 2019-09-16 DIAGNOSIS — M25511 Pain in right shoulder: Secondary | ICD-10-CM | POA: Diagnosis not present

## 2019-09-16 DIAGNOSIS — M5442 Lumbago with sciatica, left side: Secondary | ICD-10-CM | POA: Diagnosis not present

## 2019-09-16 DIAGNOSIS — M542 Cervicalgia: Secondary | ICD-10-CM | POA: Diagnosis not present

## 2019-09-16 DIAGNOSIS — M5441 Lumbago with sciatica, right side: Secondary | ICD-10-CM

## 2019-09-16 DIAGNOSIS — G8929 Other chronic pain: Secondary | ICD-10-CM

## 2019-09-16 MED ORDER — HYDROCODONE-ACETAMINOPHEN 7.5-325 MG PO TABS: 1.0000 | ORAL_TABLET | Freq: Two times a day (BID) | ORAL | 0 refills | Status: DC

## 2019-09-16 NOTE — Progress Notes (Signed)
[facility]  PATIENT: Kerry Brooks DOB: [birth year]  REFERRING DOCTOR OR PCP:  Brooke Bonito  SOURCE: patient  _________________________________   HISTORICAL  CHIEF COMPLAINT:  Chief Complaint  Patient presents with  . Follow-up    RM 13, alone. Last seen 05/17/2019. Here to f/u on back pain/sciatica. Has trouble raising arms, intermittently. States TPI has helped is the past. Back hurts a little today.   . Gait Problem    Ambulates with cane. In [facility] in office today. No recent falls.   . Angioedema    Has swelling in right ankle currently. Has appt 10/05/19 with ortho Md at [facility]. Has appt with diabetic doctor on 11/17/19.    HISTORY OF PRESENT ILLNESS:  Kerry Brooks is a 79 y.o. woman with LBP, left shoulder pain and knee pain.    09/16/2019 She is reporting more pain, especially in her shoulders, left > right  Pain is worse when she raises her arm.   She also has had some neck pain but the shoulder pain is worse than the neck pain.  She also reports some LBP, much better than the last visit.  Marland Kitchen   TPIs in the lumbar paraspinal muscles helped the pain.    She is on hydrocodone 7.5 mg bid for her chronic musculoskeletal pain.  She also has a benefit from Voltaren gel.    She also has SLE and was on Actemra infusions   She also has DM and reports sugars have been off at times.  Last HgBA1c was 05/12/19.     Update 05/17/2019: She reports axial back pain.  It is worse with standing and walking/activity.   She would notice stiffness in the back at times.  The TPIs at the last visit helped.  The trigger points were in the lower lumbar paraspinal muscles..  She also has knee pain, worse when she walks.   She also is on hydrocodone with benefit.     She has had more knee pain and is concerned about an SLE flare.   She sees Dr. Sharmon Revere.   She also has DM and reports  Sugars were a little low so insulin was redcued.  She denies numbness or tingling in  the feet though does have mild reduced sensation on exam.  She reports that when she had her haircut recently she was told she had a mole on the scalp.  She does not not recall being told about this in the past.  She has completed the COVID-19 vaccinations.  She tolerated them well.  Update 01/13/2019: She continues to have LBP.    Pain is mostly axial.  It is worse with standing and walking/activity.   She would notice stiffness in the back at times.  The TPIs at the last visit helped.  She also has knee pain, worse when she walks.   She also is on hydrocodone with benefit.     Hr DM is doing better with HgbA1c down to 8.0.   She has seen Dr. Dot Been (cardiology) and was told her heart is doing well.    She has SLE.  She denies any neck pain at this time.  Headaches have done better this year.  Update 09/16/18: She reports more lower back pain.    She reports pain is mostly axial.    At the last visit, we did a lumbar paraspinal trigger point injection with som benefit.   She also reports knee pain.    Hydrocodone helps her  pain some.    She has been having many UTI's.  She is currently on cephaalexin 500 mg po tid but feels nauseous and has diarrhea.      She sees Dr. Z for SLE and is on ActemrHerma Carsona as a DMT.   However due to frequent UTIs it is being held.    She has Tyoe 2 DM with decent control (HgbA1c 7.6-8.1).   She denies tingling or dysesthesia in feet.  She is following CDC Covid-19 guidelines.   She only leaves her house for doctor's visits and she has some visitors.     Update 05/06/2018: She reports more lower back pain and stiffness.   She has had recurrent UTIs and her lupus medication (Actemra) and steroids were reduced.   Gabapentin, hydrocodone and diclofenac help some.    TPIs have helped but due to infections she is supposed to avoid prednisone.    She denies any new weakness.      Her left shoulder pain is not too bad though ROM is still reduced.   Her knees still hurt but  Voltaren gel and hydrocodone help some.    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   She had joint swelling and felt weaker on her right.   She was placed on prednisone and went to rehab.   She remains on Actemra infusions.  Sugars were elevated.  Her statin was d/c.   She saw Urology this week and was placed on Cipro for a UTI.   She is doing some PT.  Worse pain is in her lower back currently.   It is usually just lumbar but occasionally there is some radiation down the legs.   Her hip pain is better since the steroids.   Hydrocodone has helped the pain some.  She takes up to 2 hydrocodones a day.    Her neck pain is doing better since changing pillows.    Gabapentin  Has also helped her pain and she tolerates it well.    Update 09/16/2017: Her worse pain is in the lower back across both sides into the buttocks.  Pain is worse with walking.    TPI's have helped in the past.    Currently L = R.    She had a lot of neck pain and headache, right > left.   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   She was diagnosed with occipital neuralgia.    She continues to note bilateral shoulder pain, left > right.   She had a recent bilateral knee injection and she noted her sugars increased over 300.     IMPRESSION: 1. Minimal grade 1 C4-5 anterolisthesis on a degenerative basis. No acute osseous process. 2. Mild low-grade upper cervical paraspinal muscle strain. 3. Mild canal stenosis at C5-6 in part due to 3 x 4 x 6 mm contiguous disc extrusion which mildly deforms the ventral spinal cord. Mild canal stenosis C3-4. 4. Neural foraminal narrowing C3-4 through C6-7: Severe on the left at C3-4, severe on the right at C4-5.  She is reporting she is sleeping better with gabapentin and doxepin.    Flexeril in the past made her too sleepy.  Update 05/14/2017: She is reporting more pain in the left  shoulder and the right > left buttock.   Shoulder pain is worse with external rotation and elevation.  She does not note arm weakness (but limited by pain).   No radiation of pain into the arm.    The buttock pain is going a little lower but not much into the leg.  She has had generalized muscle aches that worsened when she went on Crestor and improved when she stropped.     Insomnia is better with gabapentin and doxepin  Update 01/02/2017:    Currently,  the worse pain is across her back.  Pain increases with chores/exertion.    The pain is generally reduced when she is sitting. This is similar to pain that she has had in the past that improved with trigger point injections. When the pain is more severe she is taking the hydrocodone, usually 2 times a day.  She also reprorts pain in her left shoulder.  Pain increases with lifting and external rotation.   The pain is improved if the shoulder is relaxed and she holds her arm into her chest. This is similar to pain that she has experienced in the past that improved with a subacromial bursa injection.  Volltaren gel helped but was expensive  Insomnia has done better with a combination of gabapentin and doxepin. She tolerates these medications well.  -  From 09/10/2016: Left sided sciatica:   She reports pain in her lower back and buttock and down the left leg   In the past, LBP/buttock pain improved after piriformis muscle injections.  Pain has increased over the last month on the left.  The right back and leg are fine. When intense, the pain will radiate down the left posterior leg.  She has noted some dysesthesias in her left leg (from buttock to ankle).   Gabapentin and hydrocodone have helped the pain in the past.   She takes 1-2 hydrocodones a day if pain flares up  Neck/shoulder pain:   She feels her neck and shoulder pain are not too bad at the current time. In the past, she received benefit from subacromial bursa injections.   Lupus/PMR/Knee  pain:   She see Dr Luane School in [facility] and is on Actemra for Lupus and prednisone for PMR  DM:  She has DM but denies numbness in her toes.    Mood/Insomnia:   Insomnia has done better with gabapentin and doxepin at bedtime.Marland Kitchen         REVIEW OF SYSTEMS: Constitutional: No fevers, chills, sweats, or change in appetite Eyes: No visual changes, double vision, eye pain Ear, nose and throat: No hearing loss, ear pain, nasal congestion, sore throat Cardiovascular: No chest pain, palpitations Respiratory: No shortness of breath at rest or with exertion.   No wheezes GastrointestinaI: No nausea, vomiting, diarrhea, abdominal pain, fecal incontinence Genitourinary: No dysuria, urinary retention or frequency.  No nocturia. Musculoskeletal: Mild neck pain.  Notes left greater than right shoulder pain and bilat back pain Integumentary: No rash, pruritus, skin lesions Neurological: as above Psychiatric: No depression at this time.  No anxiety Endocrine: No palpitations, diaphoresis, change in appetite, change in weigh or increased thirst Hematologic/Lymphatic: No anemia, purpura, petechiae. Allergic/Immunologic: No itchy/runny eyes, nasal congestion, recent allergic reactions, rashes  ALLERGIES: Allergies  Allergen Reactions  . Canagliflozin Other (See Comments)    Other reaction(s): Other (See Comments) unknown unknown  . Propoxyphene Other (See Comments)    unknown    HOME MEDICATIONS:  Current Outpatient Medications:  .  alendronate (FOSAMAX) 70 MG tablet, Take 70 mg by mouth once a week., Disp: , Rfl: 11 .  amLODipine (NORVASC) 5 MG tablet, Take 5 mg by mouth., Disp: , Rfl:  .  aspirin EC 81 MG tablet, Take 81 mg by mouth daily., Disp: , Rfl:  .  carvedilol (COREG) 25 MG tablet, Take 25 mg by mouth 2 (two) times daily with a meal. , Disp: , Rfl:  .  Cyanocobalamin (VITAMIN B-12 CR) 1000 MCG TBCR, Take 1,000 mcg by mouth daily. Frequency:   Dosage:0.0     Instructions:  Note:, Disp: ,  Rfl:  .  cyclobenzaprine (FLEXERIL) 5 MG tablet, Take 1 tablet (5 mg total) by mouth 3 (three) times daily as needed. Take 5 mg at bedtime nightly and can take up to 3 a day as needed, Disp: 9 tablet, Rfl: 0 .  diclofenac Sodium (VOLTAREN) 1 % GEL, APPLY 2 G TOPICALLY 3 (THREE) TIMES DAILY. FREQUENCY: DOSAGE:0.0 INSTRUCTIONS: NOTE:, Disp: 200 g, Rfl: 0 .  dorzolamide (TRUSOPT) 2 % ophthalmic solution, Place 1 drop into both eyes 2 (two) times daily., Disp: , Rfl:  .  doxepin (SINEQUAN) 10 MG capsule, Take 1 capsule (10 mg total) by mouth at bedtime. (Patient taking differently: Take 10 mg by mouth at bedtime as needed. ), Disp: 305 capsule, Rfl: 5 .  Ergocalciferol (VITAMIN D2) 2000 UNITS TABS, Take 2,000 Units by mouth daily. Frequency:   Dosage:0     Instructions:  Note:take 5000 iu everyday, Disp: , Rfl:  .  fluticasone (FLONASE) 50 MCG/ACT nasal spray, Place 1 spray into both nostrils daily. Frequency:   Dosage:0.0     Instructions:  Note:, Disp: , Rfl:  .  gabapentin (NEURONTIN) 300 MG capsule, Take 1 capsule in the morning, 1 capsules in the afternoon and 2 capsules at night., Disp: 360 capsule, Rfl: 3 .  glimepiride (AMARYL) 1 MG tablet, TAKE 1 TABLET (1 MG TOTAL) BY MOUTH DAILY BEFORE BREAKFAST., Disp: , Rfl:  .  glucose blood (ONE TOUCH ULTRA TEST) test strip, Use 1 test strip to check blood sugar twice daily, Disp: , Rfl:  .  HYDROcodone-acetaminophen (NORCO) 7.5-325 MG tablet, Take 1 tablet by mouth 2 (two) times daily., Disp: 60 tablet, Rfl: 0 .  Insulin Degludec (TRESIBA) 100 UNIT/ML SOLN, Inject 40 Units into the skin at bedtime., Disp: , Rfl:  .  losartan-hydrochlorothiazide (HYZAAR) 100-12.5 MG per tablet, Take 1 tablet by mouth daily. , Disp: , Rfl:  .  montelukast (SINGULAIR) 10 MG tablet, Take 10 mg by mouth at bedtime. , Disp: , Rfl: 11 .  pantoprazole (PROTONIX) 40 MG tablet, Take 40 mg by mouth 2 (two) times daily before a meal. , Disp: , Rfl:  .  potassium chloride (K-DUR) 10  MEQ tablet, Take 10 mEq by mouth daily. , Disp: , Rfl:  .  predniSONE (DELTASONE) 5 MG tablet, Take 2 tablets (10 mg total) by mouth 2 (two) times daily with a meal., Disp: , Rfl:  .  rosuvastatin (CRESTOR) 10 MG tablet, TAKE 1 TABLET BY MOUTH THREE TIMES A WEEK, Disp: , Rfl:  .  Tocilizumab (ACTEMRA IV), Inject into the vein every 30 (thirty) days., Disp: , Rfl:  .  trimethoprim (TRIMPEX) 100 MG tablet, Take 100 mg by mouth at bedtime., Disp: , Rfl: 99 .  VENTOLIN HFA 108 (90 Base) MCG/ACT inhaler, Inhale 1 puff into the lungs every 4 (four) hours as needed., Disp: , Rfl: 5  PAST MEDICAL HISTORY: Past Medical History:  Diagnosis Date  . Asthma   . Diabetes mellitus without complication (HCC)   . Hypertension   .  Lupus (systemic lupus erythematosus) (HCC)   . Neuropathy   . Raynaud's disease   . Vertebral fracture, osteoporotic (HCC)     PAST SURGICAL HISTORY: Past Surgical History:  Procedure Laterality Date  . TONSILLECTOMY      FAMILY HISTORY: History reviewed. No pertinent family history.  SOCIAL HISTORY:  Social History   Socioeconomic History  . Marital status: Divorced    Spouse name: Not on file  . Number of children: Not on file  . Years of education: Not on file  . Highest education level: Not on file  Occupational History  . Not on file  Tobacco Use  . Smoking status: Never Smoker  . Smokeless tobacco: Never Used  Substance and Sexual Activity  . Alcohol use: No    Alcohol/week: 0.0 standard drinks  . Drug use: No  . Sexual activity: Not on file  Other Topics Concern  . Not on file  Social History Narrative  . Not on file   Social Determinants of Health   Financial Resource Strain:   . Difficulty of Paying Living Expenses:   Food Insecurity:   . Worried About Programme researcher, broadcasting/film/videounning Out of Food in the Last Year:   . Baristaan Out of Food in the Last Year:   Transportation Needs:   . Freight forwarderLack of Transportation (Medical):   Marland Kitchen. Lack of Transportation (Non-Medical):     Physical Activity:   . Days of Exercise per Week:   . Minutes of Exercise per Session:   Stress:   . Feeling of Stress :   Social Connections:   . Frequency of Communication with Friends and Family:   . Frequency of Social Gatherings with Friends and Family:   . Attends Religious Services:   . Active Member of Clubs or Organizations:   . Attends BankerClub or Organization Meetings:   Marland Kitchen. Marital Status:   Intimate Partner Violence:   . Fear of Current or Ex-Partner:   . Emotionally Abused:   Marland Kitchen. Physically Abused:   . Sexually Abused:      PHYSICAL EXAM  Vitals:   09/16/19 1127  BP: (!) 178/88  Pulse: 75  SpO2: 96%  Weight: (!) 207 lb 8 oz (94.1 kg)  Height: 5\' 3"  (1.6 m)    Body mass index is 36.76 kg/m.   General: The patient is well-developed and well-nourished and in no acute distress.   Neck:   Mild tenderness in the cervical paraspinal muscles and trapezius muscles.  Musculoskeletal:   She has moderate left and mild right tenderness over the subacromial bursae.    Reduced left ROM.  She has mild tenderness over the lower lumbar paraspinal muscles.    Neurologic Exam  Mental status: The patient is alert and oriented x 3 at the time of the examination. The patient has apparent normal recent and remote memory, with an apparently normal attention span and concentration ability.   Speech is normal.  Cranial nerves: Extraocular movements are full.   Facial strength and sensation is normal.  Trapezius strength is normal.  Motor:  Muscle bulk is normal.   Tone is normal. Strength is  5 / 5 in all 4 extremities.   Sensory: Intact sensation to touch in the arms and proximal legs.  She has mildly reduced vibration sensation in the feet.  Gait and station: She needs to use her arms to stand up.  The gait is arthritic.  She is unable to do a tandem walk.  Romberg is negative.  Reflexes: Deep tendon  reflexes are symmetric and 1 in arms and absent in legs bilaterally.       ____________________________________________  ASSESSMENT AND PLAN    1. Pain of both shoulder joints   2. Chronic bilateral low back pain with bilateral sciatica   3. Neck pain      1.    Bilateral subacromial bursa injection with 25 mg Depo-Medrol in 2.5 cc lidocaine on the left and 50 mg Depo-Medrol and 1.5 cc lidocaine on the right.. She tolerated the procedure well and there were no complications.   2.   Try to stay active and exercise as tolerated. 3.   Continue hydrocodone #60/month.   I sent in a postdated prescription. The PDMP was queried and she only gets opiates from our office.  Continue gabapentin and diclofenac  4    She will return to see me in 4 months,  sooner if she has new or worsening neurologic symptoms.   Delcie Ruppert A. Epimenio Foot, MD, PhD 09/16/2019, 12:28 PM Certified in Neurology, Clinical Neurophysiology, Sleep Medicine, Pain Medicine and Neuroimaging  [facility] [address] [place], [place] [address] [phone] p

## 2019-09-29 ENCOUNTER — Other Ambulatory Visit: Payer: Self-pay | Admitting: Neurology

## 2019-10-24 ENCOUNTER — Other Ambulatory Visit: Payer: Self-pay | Admitting: Neurology

## 2019-11-02 ENCOUNTER — Other Ambulatory Visit: Payer: Self-pay | Admitting: Neurology

## 2019-11-02 MED ORDER — HYDROCODONE-ACETAMINOPHEN 7.5-325 MG PO TABS: 1.0000 | ORAL_TABLET | Freq: Two times a day (BID) | ORAL | 0 refills | Status: DC

## 2019-11-02 NOTE — Telephone Encounter (Signed)
Pt request refill HYDROcodone-acetaminophen (NORCO) 7.5-325 MG tablet at [id number]  

## 2019-11-06 ENCOUNTER — Other Ambulatory Visit: Payer: Self-pay | Admitting: Neurology

## 2019-11-08 ENCOUNTER — Telehealth: Payer: Self-pay | Admitting: Neurology

## 2019-11-08 MED ORDER — HYDROCODONE-ACETAMINOPHEN 7.5-325 MG PO TABS: 1.0000 | ORAL_TABLET | Freq: Two times a day (BID) | ORAL | 0 refills | Status: DC

## 2019-11-08 NOTE — Telephone Encounter (Signed)
Pt called [facility] that [facility]aminophen (NORCO) 7.5-325 MG tablet was called into informed her that they nor the other [facility] in the area have the HYDROcodone-acetaminophen (NORCO) 7.5-325 MG tablet in stock and they are wanting to know if the provider can call in a one time emergency order of her medication to the [facility] across the [place].  [facility] [address] In [place]  [phone]

## 2019-12-09 ENCOUNTER — Other Ambulatory Visit: Payer: Self-pay | Admitting: Neurology

## 2019-12-09 MED ORDER — HYDROCODONE-ACETAMINOPHEN 7.5-325 MG PO TABS: 1.0000 | ORAL_TABLET | Freq: Two times a day (BID) | ORAL | 0 refills | Status: DC

## 2019-12-09 NOTE — Telephone Encounter (Signed)
Pt called needing a refill on her HYDROcodone-acetaminophen (NORCO) 7.5-325 MG tablet [facility].

## 2019-12-12 ENCOUNTER — Other Ambulatory Visit: Payer: Self-pay | Admitting: Neurology

## 2020-01-09 ENCOUNTER — Other Ambulatory Visit: Payer: Self-pay | Admitting: Neurology

## 2020-01-10 ENCOUNTER — Other Ambulatory Visit: Payer: Self-pay | Admitting: Neurology

## 2020-01-10 MED ORDER — HYDROCODONE-ACETAMINOPHEN 7.5-325 MG PO TABS: 1.0000 | ORAL_TABLET | Freq: Two times a day (BID) | ORAL | 0 refills | Status: DC

## 2020-01-10 NOTE — Addendum Note (Signed)
Addended by: Arther Abbott on: 01/10/2020 09:17 AM   Modules accepted: Orders

## 2020-01-10 NOTE — Telephone Encounter (Signed)
Pt is requesting a refill for HYDROcodone-acetaminophen (NORCO) 7.5-325 MG tablet.  [phone]

## 2020-01-13 ENCOUNTER — Encounter: Payer: Self-pay | Admitting: Neurology

## 2020-01-13 ENCOUNTER — Ambulatory Visit (INDEPENDENT_AMBULATORY_CARE_PROVIDER_SITE_OTHER): Payer: Medicare Other | Admitting: Neurology

## 2020-01-13 VITALS — BP 120/80 | Ht 63.0 in | Wt 201.0 lb

## 2020-01-13 DIAGNOSIS — M5431 Sciatica, right side: Secondary | ICD-10-CM | POA: Diagnosis not present

## 2020-01-13 DIAGNOSIS — M5432 Sciatica, left side: Secondary | ICD-10-CM | POA: Diagnosis not present

## 2020-01-13 DIAGNOSIS — M5440 Lumbago with sciatica, unspecified side: Secondary | ICD-10-CM

## 2020-01-13 DIAGNOSIS — M25511 Pain in right shoulder: Secondary | ICD-10-CM

## 2020-01-13 DIAGNOSIS — M3219 Other organ or system involvement in systemic lupus erythematosus: Secondary | ICD-10-CM | POA: Diagnosis not present

## 2020-01-13 DIAGNOSIS — M25512 Pain in left shoulder: Secondary | ICD-10-CM

## 2020-01-13 MED ORDER — HYDROCODONE-ACETAMINOPHEN 7.5-325 MG PO TABS: 1.0000 | ORAL_TABLET | Freq: Two times a day (BID) | ORAL | 0 refills | Status: DC

## 2020-01-13 NOTE — Progress Notes (Signed)
[facility]  PATIENT: Kerry Brooks DOB: Jan 26, 1941  REFERRING DOCTOR OR PCP:  Brooke Bonito  SOURCE: patient  _________________________________   HISTORICAL  CHIEF COMPLAINT:  Chief Complaint  Patient presents with  . Follow-up    RM 13, alone Last seen 09/16/2019. Has appt with Rheumatologist on 01/17/20.  Marland Kitchen LBP/neck pain    on hydrocodone, gabapentin, diclofenac  . Gait Problem    In wheelchair in office today. Able to stand using cane/assistance to get weight.    HISTORY OF PRESENT ILLNESS:  Kerry Brooks is a 79 y.o. woman with LBP, left shoulder pain and knee pain.    01/13/2020 She also reports some LBP, much better than the last visit.  Worse pain is over the left piriformis muscle.   TPIs in the lumbar paraspinal muscles and/or piriformis muscle in the past helped the pain.  She has osteoporosis and we discussed that steroids might worsen this.  We have done TPI or joint injections (with steroid) about 3 times a year and she is on daily Flonase as well.   She is on alendronate weekly.      She is reporting only mild pain, especially in her shoulders, left > right  Pain is worse when she raises her arm.   She also has had some neck pain but the shoulder pain is worse than the neck pain.  She is on hydrocodone 7.5 mg bid for her chronic musculoskeletal pain  This has helped the most.  .  She also has a benefit from Voltaren gel.    She also has SLE and was on Actemra infusions   She also has DM and reports sugars have been generally well controlled this month.      REVIEW OF SYSTEMS: Constitutional: No fevers, chills, sweats, or change in appetite Eyes: No visual changes, double vision, eye pain Ear, nose and throat: No hearing loss, ear pain, nasal congestion, sore throat Cardiovascular: No chest pain, palpitations Respiratory: No shortness of breath at rest or with exertion.   No wheezes GastrointestinaI: No nausea, vomiting, diarrhea,  abdominal pain, fecal incontinence Genitourinary: No dysuria, urinary retention or frequency.  No nocturia. Musculoskeletal: Mild neck pain.  Notes left greater than right shoulder pain and bilat back pain Integumentary: No rash, pruritus, skin lesions Neurological: as above Psychiatric: No depression at this time.  No anxiety Endocrine: No palpitations, diaphoresis, change in appetite, change in weigh or increased thirst Hematologic/Lymphatic: No anemia, purpura, petechiae. Allergic/Immunologic: No itchy/runny eyes, nasal congestion, recent allergic reactions, rashes  ALLERGIES: Allergies  Allergen Reactions  . Canagliflozin Other (See Comments)    Other reaction(s): Other (See Comments) unknown unknown  . Propoxyphene Other (See Comments)    unknown    HOME MEDICATIONS:  Current Outpatient Medications:  .  alendronate (FOSAMAX) 70 MG tablet, Take 70 mg by mouth once a week., Disp: , Rfl: 11 .  amLODipine (NORVASC) 5 MG tablet, Take 5 mg by mouth., Disp: , Rfl:  .  aspirin EC 81 MG tablet, Take 81 mg by mouth daily., Disp: , Rfl:  .  carvedilol (COREG) 25 MG tablet, Take 25 mg by mouth 2 (two) times daily with a meal. , Disp: , Rfl:  .  diclofenac Sodium (VOLTAREN) 1 % GEL, APPLY 2 G TOPICALLY 3 (THREE) TIMES DAILY. FREQUENCY: DOSAGE:0.0 INSTRUCTIONS: NOTE:, Disp: 200 g, Rfl: 0 .  dorzolamide (TRUSOPT) 2 % ophthalmic solution, Place 1 drop into both eyes 2 (two) times daily., Disp: , Rfl:  .  doxepin (SINEQUAN) 10 MG capsule, Take 1 capsule (10 mg total) by mouth at bedtime. (Patient taking differently: Take 10 mg by mouth at bedtime as needed. ), Disp: 305 capsule, Rfl: 5 .  Ergocalciferol (VITAMIN D2) 2000 UNITS TABS, Take 2,000 Units by mouth daily. Frequency:   Dosage:0     Instructions:  Note:take 5000 iu everyday, Disp: , Rfl:  .  fluticasone (FLONASE) 50 MCG/ACT nasal spray, Place 1 spray into both nostrils daily. Frequency:   Dosage:0.0     Instructions:  Note:, Disp: ,  Rfl:  .  gabapentin (NEURONTIN) 300 MG capsule, Take 1 capsule in the morning, 1 capsules in the afternoon and 2 capsules at night., Disp: 360 capsule, Rfl: 3 .  glimepiride (AMARYL) 1 MG tablet, TAKE 1 TABLET (1 MG TOTAL) BY MOUTH DAILY BEFORE BREAKFAST., Disp: , Rfl:  .  glucose blood (ONE TOUCH ULTRA TEST) test strip, Use 1 test strip to check blood sugar twice daily, Disp: , Rfl:  .  HYDROcodone-acetaminophen (NORCO) 7.5-325 MG tablet, Take 1 tablet by mouth 2 (two) times daily., Disp: 60 tablet, Rfl: 0 .  Insulin Degludec (TRESIBA) 100 UNIT/ML SOLN, Inject 40 Units into the skin at bedtime. (Patient taking differently: Inject 30 Units into the skin at bedtime. ), Disp: , Rfl:  .  losartan-hydrochlorothiazide (HYZAAR) 100-12.5 MG per tablet, Take 1 tablet by mouth daily. , Disp: , Rfl:  .  montelukast (SINGULAIR) 10 MG tablet, Take 10 mg by mouth at bedtime. , Disp: , Rfl: 11 .  pantoprazole (PROTONIX) 40 MG tablet, Take 40 mg by mouth 2 (two) times daily before a meal. , Disp: , Rfl:  .  potassium chloride (K-DUR) 10 MEQ tablet, Take 10 mEq by mouth daily. , Disp: , Rfl:  .  predniSONE (DELTASONE) 5 MG tablet, Take 2 tablets (10 mg total) by mouth 2 (two) times daily with a meal. (Patient taking differently: Take 15 mg by mouth 2 (two) times daily with a meal. ), Disp: , Rfl:  .  rosuvastatin (CRESTOR) 10 MG tablet, TAKE 1 TABLET BY MOUTH THREE TIMES A WEEK, Disp: , Rfl:  .  Tocilizumab (ACTEMRA IV), Inject into the vein every 30 (thirty) days. (Patient not taking: Reported on 01/13/2020), Disp: , Rfl:  .  VENTOLIN HFA 108 (90 Base) MCG/ACT inhaler, Inhale 1 puff into the lungs every 4 (four) hours as needed. (Patient not taking: Reported on 01/13/2020), Disp: , Rfl: 5  PAST MEDICAL HISTORY: Past Medical History:  Diagnosis Date  . Asthma   . Diabetes mellitus without complication (HCC)   . Hypertension   . Lupus (systemic lupus erythematosus) (HCC)   . Neuropathy   . Raynaud's disease    . Vertebral fracture, osteoporotic (HCC)     PAST SURGICAL HISTORY: Past Surgical History:  Procedure Laterality Date  . TONSILLECTOMY      FAMILY HISTORY: History reviewed. No pertinent family history.  SOCIAL HISTORY:  Social History   Socioeconomic History  . Marital status: Divorced    Spouse name: Not on file  . Number of children: Not on file  . Years of education: Not on file  . Highest education level: Not on file  Occupational History  . Not on file  Tobacco Use  . Smoking status: Never Smoker  . Smokeless tobacco: Never Used  Substance and Sexual Activity  . Alcohol use: No    Alcohol/week: 0.0 standard drinks  . Drug use: No  . Sexual activity: Not on file  Other Topics Concern  . Not on file  Social History Narrative  . Not on file   Social Determinants of Health   Financial Resource Strain:   . Difficulty of Paying Living Expenses: Not on file  Food Insecurity:   . Worried About Programme researcher, broadcasting/film/videounning Out of Food in the Last Year: Not on file  . Ran Out of Food in the Last Year: Not on file  Transportation Needs:   . Lack of Transportation (Medical): Not on file  . Lack of Transportation (Non-Medical): Not on file  Physical Activity:   . Days of Exercise per Week: Not on file  . Minutes of Exercise per Session: Not on file  Stress:   . Feeling of Stress : Not on file  Social Connections:   . Frequency of Communication with Friends and Family: Not on file  . Frequency of Social Gatherings with Friends and Family: Not on file  . Attends Religious Services: Not on file  . Active Member of Clubs or Organizations: Not on file  . Attends BankerClub or Organization Meetings: Not on file  . Marital Status: Not on file  Intimate Partner Violence:   . Fear of Current or Ex-Partner: Not on file  . Emotionally Abused: Not on file  . Physically Abused: Not on file  . Sexually Abused: Not on file     PHYSICAL EXAM  Vitals:   01/13/20 1102  BP: 120/80  Weight: 201  lb (91.2 kg)  Height: 5\' 3"  (1.6 m)    Body mass index is 35.61 kg/m.   General: The patient is well-developed and well-nourished and in no acute distress.   Neck:   Mild tenderness in the cervical paraspinal muscles and trapezius muscles.  Musculoskeletal:   She has mild tenderness over the left subacromial bursae.    Reduced left ROM.  She has moderate tenderness over the left piriformis muscles.    Neurologic Exam  Mental status: The patient is alert and oriented x 3 at the time of the examination. The patient has apparent normal recent and remote memory, with an apparently normal attention span and concentration ability.   Speech is normal.  Cranial nerves: Extraocular movements are full.   Facial strength and sensation is normal.  Trapezius strength is normal.  Motor:  Muscle bulk is normal.   Tone is normal. Strength is  5 / 5 in all 4 extremities.   Sensory: Intact sensation to touch in the arms and proximal legs.  She has mildly reduced vibration sensation in the feet.  Gait and station: She needs to use her arms to stand up.  The gait is arthritic and wide. She does better with support.  Romberg is negative.  Reflexes: Deep tendon reflexes are symmetric and 1 in arms and absent in legs bilaterally.      ____________________________________________  ASSESSMENT AND PLAN    1. Bilateral sciatica   2. Pain of both shoulder joints   3. Systemic lupus erythematosus with other organ involvement, unspecified SLE type (HCC)   4. Bilateral low back pain with sciatica, sciatica laterality unspecified, unspecified chronicity      1.    Left piriformis muscle TPI 40 mg Depo-Medrol in 4 cc lidocaine  She tolerated the procedure well and there were no complications.   2.   Try to stay active and exercise as tolerated. 3.   Continue hydrocodone #60/month.  The PDMP was queried and she only gets opiates from our office.  Continue gabapentin and diclofenac  4    She will return to  see me in 4 months,  sooner if she has new or worsening neurologic symptoms.   Zahriah Roes A. Epimenio Foot, MD, PhD 01/13/2020, 1:41 PM Certified in Neurology, Clinical Neurophysiology, Sleep Medicine, Pain Medicine and Neuroimaging  [facility] [address], [address] [place], [place] [address] [phone] p

## 2020-02-23 ENCOUNTER — Other Ambulatory Visit: Payer: Self-pay | Admitting: Neurology

## 2020-03-22 ENCOUNTER — Other Ambulatory Visit: Payer: Self-pay

## 2020-03-22 ENCOUNTER — Encounter (HOSPITAL_BASED_OUTPATIENT_CLINIC_OR_DEPARTMENT_OTHER): Payer: Self-pay

## 2020-03-22 ENCOUNTER — Emergency Department (HOSPITAL_BASED_OUTPATIENT_CLINIC_OR_DEPARTMENT_OTHER)
Admission: EM | Admit: 2020-03-22 | Discharge: 2020-03-23 | Disposition: A | Discharge status: Home / Self Care | Payer: Medicare Other | Attending: Emergency Medicine | Admitting: Emergency Medicine

## 2020-03-22 DIAGNOSIS — J45909 Unspecified asthma, uncomplicated: Secondary | ICD-10-CM | POA: Diagnosis not present

## 2020-03-22 DIAGNOSIS — M436 Torticollis: Secondary | ICD-10-CM

## 2020-03-22 DIAGNOSIS — Z79899 Other long term (current) drug therapy: Secondary | ICD-10-CM | POA: Insufficient documentation

## 2020-03-22 DIAGNOSIS — M542 Cervicalgia: Secondary | ICD-10-CM | POA: Diagnosis present

## 2020-03-22 DIAGNOSIS — E119 Type 2 diabetes mellitus without complications: Secondary | ICD-10-CM | POA: Diagnosis not present

## 2020-03-22 DIAGNOSIS — Z7984 Long term (current) use of oral hypoglycemic drugs: Secondary | ICD-10-CM | POA: Insufficient documentation

## 2020-03-22 DIAGNOSIS — I10 Essential (primary) hypertension: Secondary | ICD-10-CM | POA: Insufficient documentation

## 2020-03-22 DIAGNOSIS — Z7982 Long term (current) use of aspirin: Secondary | ICD-10-CM | POA: Insufficient documentation

## 2020-03-22 DIAGNOSIS — Z7952 Long term (current) use of systemic steroids: Secondary | ICD-10-CM | POA: Diagnosis not present

## 2020-03-22 MED ORDER — LIDOCAINE 5 % EX PTCH: 1.0000 | MEDICATED_PATCH | CUTANEOUS | 0 refills | Status: DC

## 2020-03-22 MED ORDER — LIDOCAINE 5 % EX PTCH
1.0000 | MEDICATED_PATCH | CUTANEOUS | Status: DC
  Administered 2020-03-23: 1 via TRANSDERMAL
  Filled 2020-03-22: qty 1

## 2020-03-22 MED ORDER — DIAZEPAM 2 MG PO TABS
2.0000 mg | ORAL_TABLET | Freq: Once | ORAL | Status: AC
  Administered 2020-03-22: 2 mg via ORAL
  Filled 2020-03-22: qty 1

## 2020-03-22 MED ORDER — HYDROMORPHONE HCL 1 MG/ML IJ SOLN
1.0000 mg | Freq: Once | INTRAMUSCULAR | Status: AC
  Administered 2020-03-22: 1 mg via INTRAVENOUS
  Filled 2020-03-22: qty 1

## 2020-03-22 NOTE — ED Provider Notes (Signed)
[facility] EMERGENCY DEPARTMENT Provider Note   CSN: [id number] Arrival date & time: 03/22/20  1932     History Chief Complaint  Patient presents with  . Neck Pain    Kerry Brooks is a 80 y.o. female some history of asthma, diabetes, hypertension, lupus who presents for evaluation of neck pain that began 3 days ago.  Patient reports that she woke up 3 days ago and immediately had pain on the side of her neck.  She denies any preceding trauma, injury, fall.  She is initially started on the left side but then she felt like it was radiating to both sides.  She states that since then, she has had pain when she tries to turn her neck.  She states she has taken prednisone.  No other medications that she is taking.  She denies any fevers, numbness/weakness, nausea/vomiting, vision changes, saddle anesthesia, urinary bowel incontinence, difficulty walking.  She denies any history of HIV, cancer.  She denies any history of IV drug use.  The history is provided by the patient.       Past Medical History:  Diagnosis Date  . Asthma   . Diabetes mellitus without complication (HCC)   . Hypertension   . Lupus (systemic lupus erythematosus) (HCC)   . Neuropathy   . Raynaud's disease   . Vertebral fracture, osteoporotic [facility])     Patient Active Problem List   Diagnosis Date Noted  . Bilateral low back pain with sciatica 01/13/2020  . CVA (cerebral vascular accident) (HCC) 11/09/2017  . Suspected stroke patient last known to be well more than 2 hours ago 11/07/2017  . Acute right-sided weakness 11/07/2017  . Hypokalemia 11/07/2017  . Elevated troponin 11/07/2017  . Neck pain 05/09/2016  . Urinary incontinence 01/02/2016  . Acquired hallux valgus 08/22/2015  . Hammer toe 08/22/2015  . Burning with urination 06/20/2015  . Other long term (current) drug therapy 06/20/2015  . Gonalgia 06/19/2015  . Allergic rhinitis 02/15/2015  . Cervical osteoarthritis 02/15/2015  . Acid  reflux 02/15/2015  . BP (high blood pressure) 02/15/2015  . Chronic bilateral low back pain with bilateral sciatica 02/15/2015  . Microalbuminuria 02/15/2015  . Diabetic retinopathy associated with type 2 diabetes mellitus (HCC) 02/15/2015  . Asthma, mild persistent 02/15/2015  . 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 B12 deficiency 02/15/2015  . Diabetes mellitus (HCC) 12/16/2014  . Bilateral low back pain with bilateral sciatica 12/16/2014  . Insulin-requiring or dependent type II diabetes mellitus (HCC) 05/17/2014  . Lower back pain 05/17/2014  . Sciatica 05/17/2014  . Lumbar vertebral fracture (HCC) 05/17/2014  . Pain of both shoulder joints 05/17/2014  . Insomnia 05/17/2014  . Fracture of lumbar vertebra (HCC) 05/17/2014  . Airway hyperreactivity 08/01/2012  . Bunion 08/01/2012  . Cataract 08/01/2012  . Diaphragmatic hernia 08/01/2012  . D (diarrhea) 08/01/2012  . Essential (primary) hypertension 08/01/2012  . Glaucoma 08/01/2012  . HLD (hyperlipidemia) 08/01/2012  . 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 N&V (nausea and vomiting) 08/01/2012  . Arthralgia of lower leg 08/01/2012  . Arthralgia of hip or thigh 08/01/2012  . Paroxysmal digital cyanosis 08/01/2012  . Diabetes mellitus, type 2 (HCC) 08/01/2012  . FOM (frequency of micturition) 08/01/2012  . Emesis 08/01/2012  . Systemic lupus erythematosus (HCC) 08/01/2012  . Type 2 diabetes mellitus (HCC) 08/01/2012    Past Surgical History:  Procedure Laterality Date  . TONSILLECTOMY       OB History   No obstetric history  on file.     No family history on file.  Social History   Tobacco Use  . Smoking status: Never Smoker  . Smokeless tobacco: Never Used  Substance Use Topics  . Alcohol use: No    Alcohol/week: 0.0 standard drinks  . Drug use: No    Home Medications Prior to Admission medications   Medication Sig Start Date End Date Taking? Authorizing Provider  lidocaine (LIDODERM)  5 % Place 1 patch onto the skin daily. Remove & Discard patch within 12 hours or as directed by MD 03/22/20  Yes Maxwell Caul, PA-C  alendronate (FOSAMAX) 70 MG tablet Take 70 mg by mouth once a week. 10/24/17   [provider]  amLODipine (NORVASC) 5 MG tablet Take 5 mg by mouth. 08/09/16   [provider]  aspirin EC 81 MG tablet Take 81 mg by mouth daily. 09/20/11   [provider]  carvedilol (COREG) 25 MG tablet Take 25 mg by mouth 2 (two) times daily with a meal.  09/20/11   [provider]  diclofenac Sodium (VOLTAREN) 1 % GEL APPLY 2 G TOPICALLY 3 (THREE) TIMES DAILY. FREQUENCY: DOSAGE:0.0 INSTRUCTIONS: NOTE: 02/23/20   Sater, Pearletha Furl, MD  dorzolamide (TRUSOPT) 2 % ophthalmic solution Place 1 drop into both eyes 2 (two) times daily. 05/07/19   [provider]  doxepin (SINEQUAN) 10 MG capsule Take 1 capsule (10 mg total) by mouth at bedtime. Patient taking differently: Take 10 mg by mouth at bedtime as needed.  05/17/14   Sater, Pearletha Furl, MD  Ergocalciferol (VITAMIN D2) 2000 UNITS TABS Take 2,000 Units by mouth daily. Frequency:   Dosage:0     Instructions:  Note:take 5000 iu everyday 12/06/11   [provider]  fluticasone (FLONASE) 50 MCG/ACT nasal spray Place 1 spray into both nostrils daily. Frequency:   Dosage:0.0     Instructions:  Note: 09/20/11   [provider]  gabapentin (NEURONTIN) 300 MG capsule Take 1 capsule in the morning, 1 capsules in the afternoon and 2 capsules at night. 08/23/19   Sater, Pearletha Furl, MD  glimepiride (AMARYL) 1 MG tablet TAKE 1 TABLET (1 MG TOTAL) BY MOUTH DAILY BEFORE BREAKFAST. 09/09/18   [provider]  glucose blood (ONE TOUCH ULTRA TEST) test strip Use 1 test strip to check blood sugar twice daily 03/20/16   [provider]  HYDROcodone-acetaminophen (NORCO) 7.5-325 MG tablet Take 1 tablet by mouth 2 (two) times daily. 01/13/20   Sater, Pearletha Furl, MD  Insulin Degludec  (TRESIBA) 100 UNIT/ML SOLN Inject 40 Units into the skin at bedtime. Patient taking differently: Inject 30 Units into the skin at bedtime.  11/12/17   Tyrone Nine, MD  losartan-hydrochlorothiazide (HYZAAR) 100-12.5 MG per tablet Take 1 tablet by mouth daily.  05/01/10   [provider]  montelukast (SINGULAIR) 10 MG tablet Take 10 mg by mouth at bedtime.  08/27/17   [provider]  pantoprazole (PROTONIX) 40 MG tablet Take 40 mg by mouth 2 (two) times daily before a meal.     [provider]  potassium chloride (K-DUR) 10 MEQ tablet Take 10 mEq by mouth daily.  05/01/10   [provider]  predniSONE (DELTASONE) 5 MG tablet Take 2 tablets (10 mg total) by mouth 2 (two) times daily with a meal. Patient taking differently: Take 15 mg by mouth 2 (two) times daily with a meal.  11/12/17   Tyrone Nine, MD  rosuvastatin (CRESTOR) 10 MG  tablet TAKE 1 TABLET BY MOUTH THREE TIMES A WEEK 09/10/18   [provider]  Tocilizumab (ACTEMRA IV) Inject into the vein every 30 (thirty) days. Patient not taking: Reported on 01/13/2020    [provider]  VENTOLIN HFA 108 (90 Base) MCG/ACT inhaler Inhale 1 puff into the lungs every 4 (four) hours as needed. Patient not taking: Reported on 01/13/2020 10/15/17   [provider]    Allergies    Canagliflozin and Propoxyphene  Review of Systems   Review of Systems  Constitutional: Negative for fever.  Musculoskeletal: Positive for neck pain. Negative for back pain.  Neurological: Negative for weakness and numbness.  All other systems reviewed and are negative.   Physical Exam Updated Vital Signs BP (!) 133/91 (BP Location: Right Arm)   Pulse 99   Temp 99.2 F (37.3 C) (Oral)   Resp 16   Ht 5\' 3"  (1.6 m)   Wt 69.4 kg   SpO2 98%   BMI 27.10 kg/m   Physical Exam Vitals and nursing note reviewed.  Constitutional:      Appearance: She is well-developed and well-nourished.  HENT:     Head:  Normocephalic and atraumatic.  Eyes:     General: No scleral icterus.       Right eye: No discharge.        Left eye: No discharge.     Extraocular Movements: EOM normal.     Conjunctiva/sclera: Conjunctivae normal.  Neck:      Comments: Tenderness palpation of the bilateral paraspinal muscles.  No midline tenderness.  No deformity crepitus noted.  Neck is supple.  No rigidity.  No overlying warmth, erythema.  She reports pain with movement laterally. Pulmonary:     Effort: Pulmonary effort is normal.  Skin:    General: Skin is warm and dry.  Neurological:     Mental Status: She is alert.     Comments: Cranial nerves III-XII intact Follows commands, Moves all extremities  5/5 strength to BUE and BLE  Sensation intact throughout all major nerve distributions No slurred speech. No facial droop.   Psychiatric:        Mood and Affect: Mood and affect normal.        Speech: Speech normal.        Behavior: Behavior normal.     ED Results / Procedures / Treatments   Labs (all labs ordered are listed, but only abnormal results are displayed) Labs Reviewed - No data to display  EKG None  Radiology No results found.  Procedures Procedures   Medications Ordered in ED Medications  lidocaine (LIDODERM) 5 % 1 patch (has no administration in time range)  HYDROmorphone (DILAUDID) injection 1 mg (1 mg Intravenous Given 03/22/20 2205)  diazepam (VALIUM) tablet 2 mg (2 mg Oral Given 03/22/20 2207)    ED Course  I have reviewed the triage vital signs and the nursing notes.  Pertinent labs & imaging results that were available during my care of the patient were reviewed by me and considered in my medical decision making (see chart for details).    MDM Rules/Calculators/A&P                          80 year old female who presents for evaluation of neck pain that started 3 days ago.  She states she woke up and the neck was hurting.  She states since then she has had pain to both  sides and  states it hurts more with stress of her head.  No preceding trauma, injury, fall.  She has taken prednisone for the pain.  No fevers, numbness/weakness.  No saddle anesthesia, urinary or bowel incontinence.  On initial arrival, she is afebrile, toxic appearing.  Vital signs are stable.  She has tenderness palpation noted to bilateral paraspinal muscles.  No midline tenderness.  No deformity or crepitus noted.  No overlying warmth, erythema.  Suspect this is torticollis given that it started when she woke up and it hurts when she moves.  History/physical exam not concerning for meningitis.  Additionally, do not suspect acute fracture dislocation given lack of trauma.  Will give analgesics here in the ED.  Reevaluation.  Patient is resting comfortably after pain medication.  I am able to passively range of motion her neck little bit better.  We will plan to give her Lidoderm patches.  Encouraged at home supportive care measures.  I reviewed her on PMP and she consistently gets 60 hydrocodone a month.  At this time, do not feel that acute narcotics are warranted in this case.  Additionally, given lack of trauma, midline tenderness, do not feel that imaging is warranted. At this time, patient exhibits no emergent life-threatening condition that require further evaluation in ED. Discussed patient with Dr. Stevie Kern who is agreeable. Patient had ample opportunity for questions and discussion. All patient's questions were answered with full understanding. Strict return precautions discussed. Patient expresses understanding and agreement to plan.   Portions of this note were generated with Scientist, clinical (histocompatibility and immunogenetics). Dictation errors may occur despite best attempts at proofreading.  Final Clinical Impression(s) / ED Diagnoses Final diagnoses:  Torticollis    Rx / DC Orders ED Discharge Orders         Ordered    lidocaine (LIDODERM) 5 %  Every 24 hours        03/22/20 2356           Maxwell Caul, PA-C 03/22/20 2357    Milagros Loll, MD 03/24/20 2356

## 2020-03-22 NOTE — Discharge Instructions (Signed)
You can take 1000 mg of Tylenol.  Do not exceed 4000 mg of Tylenol a day. Use Lidoderm patches as directed.  You can also use heat to the area to help.  Topical things like icy hot may help.  Follow-up with your primary care doctor as well as your neurologist and dermatologist.  Return the emergency room for any worsening pain, fevers, numbness/weakness of your arms or legs or any other worsening concerning symptoms.

## 2020-03-22 NOTE — ED Triage Notes (Addendum)
Per pt and daughter pt c/o pain to left side of neck "crick" x 2 days-denies injury-to triage in w/c-drowsy-states she took 1 hydrocodone ~2pm

## 2020-03-23 DIAGNOSIS — M436 Torticollis: Secondary | ICD-10-CM | POA: Diagnosis not present

## 2020-03-23 NOTE — ED Notes (Signed)
Family and Patient verbalizes understanding of discharge instructions. Opportunity for questioning and answers were provided. Armband removed by staff, pt discharged from ED in wheelchair to home.

## 2020-03-24 ENCOUNTER — Telehealth: Payer: Self-pay | Admitting: Neurology

## 2020-03-24 NOTE — Telephone Encounter (Addendum)
Pt's daughter Bobette Leyh, on [place] called stating that the pt was in the emergency room recently and she is concerned for the pt because the pt lives alone and is having difficulty getting to the toilet by her bed which has caused her to have several bad accidents. Daughter also states that the pt is not taking her medications right. Daughter is wanting to know if she can be referred to a rehab facility to help the pt out. Please call daughter on cell phone and if she can not be reached please call her work number [phone].  Please advise.

## 2020-03-26 ENCOUNTER — Other Ambulatory Visit: Payer: Self-pay

## 2020-03-26 ENCOUNTER — Encounter (HOSPITAL_COMMUNITY): Payer: Self-pay

## 2020-03-26 ENCOUNTER — Emergency Department (HOSPITAL_COMMUNITY)
Admission: EM | Admit: 2020-03-26 | Discharge: 2020-03-27 | Disposition: A | Discharge status: Home / Self Care | Payer: Medicare Other | Attending: Emergency Medicine | Admitting: Emergency Medicine

## 2020-03-26 DIAGNOSIS — Z79899 Other long term (current) drug therapy: Secondary | ICD-10-CM | POA: Diagnosis not present

## 2020-03-26 DIAGNOSIS — J45909 Unspecified asthma, uncomplicated: Secondary | ICD-10-CM | POA: Diagnosis not present

## 2020-03-26 DIAGNOSIS — M542 Cervicalgia: Secondary | ICD-10-CM | POA: Insufficient documentation

## 2020-03-26 DIAGNOSIS — E11319 Type 2 diabetes mellitus with unspecified diabetic retinopathy without macular edema: Secondary | ICD-10-CM | POA: Insufficient documentation

## 2020-03-26 DIAGNOSIS — I10 Essential (primary) hypertension: Secondary | ICD-10-CM | POA: Diagnosis not present

## 2020-03-26 DIAGNOSIS — M791 Myalgia, unspecified site: Secondary | ICD-10-CM

## 2020-03-26 DIAGNOSIS — Z794 Long term (current) use of insulin: Secondary | ICD-10-CM | POA: Insufficient documentation

## 2020-03-26 DIAGNOSIS — Z20822 Contact with and (suspected) exposure to covid-19: Secondary | ICD-10-CM | POA: Insufficient documentation

## 2020-03-26 LAB — URINALYSIS, ROUTINE W REFLEX MICROSCOPIC
Bacteria, UA: NONE SEEN
Bilirubin Urine: NEGATIVE
Glucose, UA: 150 mg/dL — AB
Hgb urine dipstick: NEGATIVE
Ketones, ur: 20 mg/dL — AB
Nitrite: NEGATIVE
Protein, ur: 30 mg/dL — AB
Specific Gravity, Urine: 1.019 (ref 1.005–1.030)
pH: 5 (ref 5.0–8.0)

## 2020-03-26 LAB — CBC
HCT: 34.1 % — ABNORMAL LOW (ref 36.0–46.0)
Hemoglobin: 10.4 g/dL — ABNORMAL LOW (ref 12.0–15.0)
MCH: 28.3 pg (ref 26.0–34.0)
MCHC: 30.5 g/dL (ref 30.0–36.0)
MCV: 92.7 fL (ref 80.0–100.0)
Platelets: 239 10*3/uL (ref 150–400)
RBC: 3.68 MIL/uL — ABNORMAL LOW (ref 3.87–5.11)
RDW: 13.8 % (ref 11.5–15.5)
WBC: 8.6 10*3/uL (ref 4.0–10.5)
nRBC: 0 % (ref 0.0–0.2)

## 2020-03-26 LAB — COMPREHENSIVE METABOLIC PANEL
ALT: 25 U/L (ref 0–44)
AST: 52 U/L — ABNORMAL HIGH (ref 15–41)
Albumin: 2.5 g/dL — ABNORMAL LOW (ref 3.5–5.0)
Alkaline Phosphatase: 60 U/L (ref 38–126)
Anion gap: 16 — ABNORMAL HIGH (ref 5–15)
BUN: 20 mg/dL (ref 8–23)
CO2: 22 mmol/L (ref 22–32)
Calcium: 8.6 mg/dL — ABNORMAL LOW (ref 8.9–10.3)
Chloride: 95 mmol/L — ABNORMAL LOW (ref 98–111)
Creatinine, Ser: 0.83 mg/dL (ref 0.44–1.00)
GFR, Estimated: >60 mL/min (ref >60)
Glucose, Bld: 298 mg/dL — ABNORMAL HIGH (ref 70–99)
Potassium: 4 mmol/L (ref 3.5–5.1)
Sodium: 133 mmol/L — ABNORMAL LOW (ref 135–145)
Total Bilirubin: 1.6 mg/dL — ABNORMAL HIGH (ref 0.3–1.2)
Total Protein: 7.2 g/dL (ref 6.5–8.1)

## 2020-03-26 LAB — SARS CORONAVIRUS 2 BY RT PCR (HOSPITAL ORDER, PERFORMED IN ~~LOC~~ HOSPITAL LAB): SARS Coronavirus 2: NEGATIVE

## 2020-03-26 MED ORDER — SODIUM CHLORIDE 0.9 % IV BOLUS
500.0000 mL | Freq: Once | INTRAVENOUS | Status: AC
  Administered 2020-03-26: 500 mL via INTRAVENOUS

## 2020-03-26 MED ORDER — MORPHINE SULFATE (PF) 2 MG/ML IV SOLN
2.0000 mg | Freq: Once | INTRAVENOUS | Status: AC
  Administered 2020-03-26: 2 mg via INTRAVENOUS
  Filled 2020-03-26: qty 1

## 2020-03-26 MED ORDER — TRAMADOL HCL 50 MG PO TABS
50.0000 mg | ORAL_TABLET | Freq: Once | ORAL | Status: AC
  Administered 2020-03-26: 50 mg via ORAL
  Filled 2020-03-26: qty 1

## 2020-03-26 MED ORDER — ONDANSETRON HCL 4 MG/2ML IJ SOLN
4.0000 mg | Freq: Once | INTRAMUSCULAR | Status: AC
  Administered 2020-03-26: 4 mg via INTRAVENOUS
  Filled 2020-03-26: qty 2

## 2020-03-26 NOTE — ED Notes (Signed)
Leahna Hewson (Daughter) [phone]

## 2020-03-26 NOTE — ED Notes (Signed)
Heavy one assist onto bedpan.

## 2020-03-26 NOTE — ED Notes (Signed)
Pt assisted onto stretcher, c/o pain mostly to head and neck but now "all over" pt states she is unable to get around and get to restroom at home d/t pain. Pt A & O, denies trauma. Pt reports last time she felt similar she was admitted x 8 days for Lupus flare

## 2020-03-26 NOTE — Discharge Instructions (Addendum)
Call your primary care doctor or specialist as discussed in the next 2-3 days.   Return immediately back to the ER if:  Your symptoms worsen within the next 12-24 hours. You develop new symptoms such as new fevers, persistent vomiting, new pain, shortness of breath, or new weakness or numbness, or if you have any other concerns.  

## 2020-03-26 NOTE — ED Triage Notes (Signed)
Patient seen at [facility] on 1/26 for generalized pain and was prescribed a patch. States that the patch isnt working and wants something for pain. Alert and oriented, NAD. Denies fever, vaccinated.

## 2020-03-26 NOTE — ED Provider Notes (Signed)
[facility] EMERGENCY DEPARTMENT Provider Note   CSN: [id number] Arrival date & time: 03/26/20  1326     History No chief complaint on file.   Kerry Brooks is a 80 y.o. female.  Patient presents with generalized body aches ongoing for the past week.  Describes aches across her "whole body."  She also complains of neck pain as well.  She was recently seen at another facility for neck pain and discharged home with lidocaine patch which she states has not been working well.  She otherwise denies any fevers no cough no vomiting or diarrhea no chest pain or shortness of breath.        Past Medical History:  Diagnosis Date  . Asthma   . Diabetes mellitus without complication (HCC)   . Hypertension   . Lupus (systemic lupus erythematosus) (HCC)   . Neuropathy   . Raynaud's disease   . Vertebral fracture, osteoporotic [facility])     Patient Active Problem List   Diagnosis Date Noted  . Bilateral low back pain with sciatica 01/13/2020  . CVA (cerebral vascular accident) (HCC) 11/09/2017  . Suspected stroke patient last known to be well more than 2 hours ago 11/07/2017  . Acute right-sided weakness 11/07/2017  . Hypokalemia 11/07/2017  . Elevated troponin 11/07/2017  . Neck pain 05/09/2016  . Urinary incontinence 01/02/2016  . Acquired hallux valgus 08/22/2015  . Hammer toe 08/22/2015  . Burning with urination 06/20/2015  . Other long term (current) drug therapy 06/20/2015  . Gonalgia 06/19/2015  . Allergic rhinitis 02/15/2015  . Cervical osteoarthritis 02/15/2015  . Acid reflux 02/15/2015  . BP (high blood pressure) 02/15/2015  . Chronic bilateral low back pain with bilateral sciatica 02/15/2015  . Microalbuminuria 02/15/2015  . Diabetic retinopathy associated with type 2 diabetes mellitus (HCC) 02/15/2015  . Asthma, mild persistent 02/15/2015  . 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 B12 deficiency 02/15/2015  . Diabetes mellitus (HCC)  12/16/2014  . Bilateral low back pain with bilateral sciatica 12/16/2014  . Insulin-requiring or dependent type II diabetes mellitus (HCC) 05/17/2014  . Lower back pain 05/17/2014  . Sciatica 05/17/2014  . Lumbar vertebral fracture (HCC) 05/17/2014  . Pain of both shoulder joints 05/17/2014  . Insomnia 05/17/2014  . Fracture of lumbar vertebra (HCC) 05/17/2014  . Airway hyperreactivity 08/01/2012  . Bunion 08/01/2012  . Cataract 08/01/2012  . Diaphragmatic hernia 08/01/2012  . D (diarrhea) 08/01/2012  . Essential (primary) hypertension 08/01/2012  . Glaucoma 08/01/2012  . HLD (hyperlipidemia) 08/01/2012  . 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 N&V (nausea and vomiting) 08/01/2012  . Arthralgia of lower leg 08/01/2012  . Arthralgia of hip or thigh 08/01/2012  . Paroxysmal digital cyanosis 08/01/2012  . Diabetes mellitus, type 2 (HCC) 08/01/2012  . FOM (frequency of micturition) 08/01/2012  . Emesis 08/01/2012  . Systemic lupus erythematosus (HCC) 08/01/2012  . Type 2 diabetes mellitus (HCC) 08/01/2012    Past Surgical History:  Procedure Laterality Date  . TONSILLECTOMY       OB History   No obstetric history on file.     No family history on file.  Social History   Tobacco Use  . Smoking status: Never Smoker  . Smokeless tobacco: Never Used  Substance Use Topics  . Alcohol use: No    Alcohol/week: 0.0 standard drinks  . Drug use: No    Home Medications Prior to Admission medications   Medication Sig Start Date End Date Taking? Authorizing Provider  albuterol (VENTOLIN HFA) 108 (90 Base) MCG/ACT inhaler Inhale 2 puffs into the lungs every 6 (six) hours as needed for wheezing or shortness of breath.   Yes [provider]  alendronate (FOSAMAX) 70 MG tablet Take 70 mg by mouth every Saturday. 10/24/17  Yes [provider]  amLODipine (NORVASC) 5 MG tablet Take 5 mg by mouth daily. 08/09/16  Yes [provider]  diclofenac  Sodium (VOLTAREN) 1 % GEL APPLY 2 G TOPICALLY 3 (THREE) TIMES DAILY. FREQUENCY: DOSAGE:0.0 INSTRUCTIONS: NOTE: Patient taking differently: Apply 1 application topically 2 (two) times daily. 02/23/20  Yes Sater, Pearletha Furl, MD  dorzolamide (TRUSOPT) 2 % ophthalmic solution Place 1 drop into both eyes 2 (two) times daily. 05/07/19  Yes [provider]  gabapentin (NEURONTIN) 300 MG capsule Take 1 capsule in the morning, 1 capsules in the afternoon and 2 capsules at night. Patient taking differently: Take 300-600 mg by mouth See admin instructions. Take 1 capsule (300 mg) by mouth every morning and at noon, take 2 capsules (600 mg) at bedtime 08/23/19  Yes Sater, Pearletha Furl, MD  HYDROcodone-acetaminophen (NORCO) 7.5-325 MG tablet Take 1 tablet by mouth 2 (two) times daily. 01/13/20  Yes Sater, Pearletha Furl, MD  Insulin Degludec (TRESIBA) 100 UNIT/ML SOLN Inject 40 Units into the skin at bedtime. Patient taking differently: Inject 30 Units into the skin at bedtime. 11/12/17  Yes Tyrone Nine, MD  lidocaine (LIDODERM) 5 % Place 1 patch onto the skin daily. Remove & Discard patch within 12 hours or as directed by MD 03/22/20  Yes Maxwell Caul, PA-C  nitrofurantoin, macrocrystal-monohydrate, (MACROBID) 100 MG capsule Take 100 mg by mouth 2 (two) times daily. 03/17/20  Yes [provider]  predniSONE (DELTASONE) 5 MG tablet Take 5 mg by mouth 3 (three) times daily.   Yes [provider]  glucose blood test strip Use 1 test strip to check blood sugar twice daily 03/20/16   [provider]    Allergies    Canagliflozin and Propoxyphene  Review of Systems   Review of Systems  Constitutional: Negative for fever.  HENT: Negative for ear pain.   Eyes: Negative for pain.  Respiratory: Negative for cough.   Cardiovascular: Negative for chest pain.  Gastrointestinal: Negative for abdominal pain.  Genitourinary: Negative for flank pain.  Musculoskeletal: Negative for back pain.   Skin: Negative for rash.  Neurological: Negative for headaches.    Physical Exam Updated Vital Signs BP (!) 157/79   Pulse (!) 102   Temp 99.4 F (37.4 C) (Oral)   Resp 20   SpO2 100%   Physical Exam Constitutional:      General: She is not in acute distress.    Appearance: Normal appearance.  HENT:     Head: Normocephalic.     Nose: Nose normal.  Eyes:     Extraocular Movements: Extraocular movements intact.  Neck:     Comments: Range of motion of her neck diminished to his left and 4 degrees right as well as 15 degrees of flexion and 15 degrees of extension secondary to pain and discomfort. Cardiovascular:     Rate and Rhythm: Normal rate.  Pulmonary:     Effort: Pulmonary effort is normal.  Musculoskeletal:        General: Normal range of motion.     Comments: Slight decreased range of motion of her neck secondary to pain.  Neurological:     General: No focal deficit present.  Mental Status: She is alert. Mental status is at baseline.     ED Results / Procedures / Treatments   Labs (all labs ordered are listed, but only abnormal results are displayed) Labs Reviewed  COMPREHENSIVE METABOLIC PANEL - Abnormal; Notable for the following components:      Result Value   Sodium 133 (*)    Chloride 95 (*)    Glucose, Bld 298 (*)    Calcium 8.6 (*)    Albumin 2.5 (*)    AST 52 (*)    Total Bilirubin 1.6 (*)    Anion gap 16 (*)    All other components within normal limits  CBC - Abnormal; Notable for the following components:   RBC 3.68 (*)    Hemoglobin 10.4 (*)    HCT 34.1 (*)    All other components within normal limits  URINALYSIS, ROUTINE W REFLEX MICROSCOPIC - Abnormal; Notable for the following components:   Color, Urine AMBER (*)    APPearance HAZY (*)    Glucose, UA 150 (*)    Ketones, ur 20 (*)    Protein, ur 30 (*)    Leukocytes,Ua SMALL (*)    All other components within normal limits  SARS CORONAVIRUS 2 BY RT PCR (HOSPITAL ORDER, PERFORMED  IN [facility])    EKG None  Radiology No results found.  Procedures Procedures   Medications Ordered in ED Medications  traMADol (ULTRAM) tablet 50 mg (50 mg Oral Given 03/26/20 2115)  sodium chloride 0.9 % bolus 500 mL (0 mLs Intravenous Stopped 03/26/20 2224)  morphine 2 MG/ML injection 2 mg (2 mg Intravenous Given 03/26/20 2224)  ondansetron (ZOFRAN) injection 4 mg (4 mg Intravenous Given 03/26/20 2223)    ED Course  I have reviewed the triage vital signs and the nursing notes.  Pertinent labs & imaging results that were available during my care of the patient were reviewed by me and considered in my medical decision making (see chart for details).    MDM Rules/Calculators/A&P                          Patient given additional pain medications here with subsequent improvement of symptoms.  I discussed with her whether she wanted me to ask social worker to look for assisted living/nursing facility for her given that she states she lives alone and is having difficulty due to her generalized body aches.  I suspect exacerbation of her polymyalgia or lupus syndrome.  Patient states that she feels better after treatment here.  Prefers to go home and declines placement in a nursing home.  I advised return if she has fevers worsening pain or any additional concerns.   Final Clinical Impression(s) / ED Diagnoses Final diagnoses:  Myalgia    Rx / DC Orders ED Discharge Orders    None       Cheryll Cockayne, MD 03/26/20 2307

## 2020-03-27 NOTE — Telephone Encounter (Signed)
I'm not sure which rehab center would be best for her and have no specific recommendation.   Generally, the hospitals or PCP refer for this.  The daughter may wish to speak to several of the rehab centers so she knows which one she wants her to be referred to,

## 2020-03-27 NOTE — ED Notes (Signed)
Volney Presser, pts daughter will pick up patient, she is @ 30 min away.

## 2020-03-27 NOTE — Telephone Encounter (Signed)
LVM relaying Dr. Bonnita Hollow message. Advised her to reach back out if she had any further questions/concerns.

## 2020-04-03 ENCOUNTER — Other Ambulatory Visit: Payer: Self-pay | Admitting: Neurology

## 2020-04-03 MED ORDER — HYDROCODONE-ACETAMINOPHEN 7.5-325 MG PO TABS: 1.0000 | ORAL_TABLET | Freq: Two times a day (BID) | ORAL | 0 refills | Status: DC

## 2020-04-03 MED ORDER — DICLOFENAC SODIUM 1 % EX GEL: CUTANEOUS | 0 refills | Status: DC

## 2020-04-03 NOTE — Telephone Encounter (Signed)
Pt request refills diclofenac Sodium (VOLTAREN) 1 % GEL, HYDROcodone-acetaminophen (NORCO) 7.5-325 MG tablet at [id number]

## 2020-04-03 NOTE — Telephone Encounter (Signed)
E-scribed diclofenac to pharmacy as requested. Checked drug registry. She last refilled hydrocodone 02/10/20 #60. Last seen 01/13/20 and next f/u 05/17/20.

## 2020-05-04 ENCOUNTER — Other Ambulatory Visit: Payer: Self-pay | Admitting: Neurology

## 2020-05-04 MED ORDER — DICLOFENAC SODIUM 1 % EX GEL: CUTANEOUS | 0 refills | Status: DC

## 2020-05-04 MED ORDER — HYDROCODONE-ACETAMINOPHEN 7.5-325 MG PO TABS: 1.0000 | ORAL_TABLET | Freq: Two times a day (BID) | ORAL | 0 refills | Status: DC

## 2020-05-04 NOTE — Telephone Encounter (Signed)
Pt request refill diclofenac Sodium (VOLTAREN) 1 % GEL and HYDROcodone-acetaminophen (NORCO) 7.5-325 MG tablet at [id number]

## 2020-05-17 ENCOUNTER — Encounter: Payer: Self-pay | Admitting: Neurology

## 2020-05-17 ENCOUNTER — Ambulatory Visit (INDEPENDENT_AMBULATORY_CARE_PROVIDER_SITE_OTHER): Payer: Medicare Other | Admitting: Neurology

## 2020-05-17 VITALS — BP 110/70 | HR 96 | Ht 63.0 in | Wt 193.0 lb

## 2020-05-17 DIAGNOSIS — M5442 Lumbago with sciatica, left side: Secondary | ICD-10-CM

## 2020-05-17 DIAGNOSIS — M25512 Pain in left shoulder: Secondary | ICD-10-CM

## 2020-05-17 DIAGNOSIS — M542 Cervicalgia: Secondary | ICD-10-CM

## 2020-05-17 DIAGNOSIS — M5441 Lumbago with sciatica, right side: Secondary | ICD-10-CM

## 2020-05-17 DIAGNOSIS — M5431 Sciatica, right side: Secondary | ICD-10-CM | POA: Diagnosis not present

## 2020-05-17 DIAGNOSIS — M3219 Other organ or system involvement in systemic lupus erythematosus: Secondary | ICD-10-CM

## 2020-05-17 DIAGNOSIS — M5432 Sciatica, left side: Secondary | ICD-10-CM

## 2020-05-17 DIAGNOSIS — G8929 Other chronic pain: Secondary | ICD-10-CM

## 2020-05-17 DIAGNOSIS — M25511 Pain in right shoulder: Secondary | ICD-10-CM

## 2020-05-17 MED ORDER — GABAPENTIN 300 MG PO CAPS: ORAL_CAPSULE | ORAL | 3 refills | Status: DC

## 2020-05-17 NOTE — Progress Notes (Signed)
[facility]  PATIENT: Kerry Brooks DOB: 07-Nov-1940  REFERRING DOCTOR OR PCP:  Brooke Bonito  SOURCE: patient  _________________________________   HISTORICAL  CHIEF COMPLAINT:  Chief Complaint  Patient presents with  . Follow-up    RM 13, alone. Last seen 01/13/2020. Here to follow up on LBP/neck pain. In [facility] in office today. Has PCP appt on 05/29/20. Needs refill on gabapentin    HISTORY OF PRESENT ILLNESS:  Kerry Brooks is a 80 y.o. woman with LBP, left shoulder pain and knee pain.    05/17/2020: She had a lot of myalgias and more neck pain and went to the [facility] ED.   She was treated with lidoderm, and PT was initiated.   No imaging.    A few days earlier at the ED she was diagnosed with torticollis.      Currently, her LBP is her bigger problem.   She is reporting pain in both the lower lumbar region and piriformis region.   TPIs in the lumbar paraspinal muscles and/or piriformis muscle in the past helped the pain.  She has osteoporosis and we discussed that steroids might worsen this.  We have done TPI or joint injections (with steroid) about 3 times a year and she is on daily Flonase as well.   She is on alendronate weekly.      She is reporting only mild shoulder pain, left > right     She has mild neck pain but the shoulder pain is worse than the neck pain.  She is on hydrocodone 7.5 mg bid for her chronic musculoskeletal pain  This has helped the most.  No drug seeking behavior.   She also has a benefit from Voltaren gel.    She also has SLE and was on Actemra infusions   She also has DM and reports sugars have been generally well controlled this month.      REVIEW OF SYSTEMS: Constitutional: No fevers, chills, sweats, or change in appetite Eyes: No visual changes, double vision, eye pain Ear, nose and throat: No hearing loss, ear pain, nasal congestion, sore throat Cardiovascular: No chest pain, palpitations Respiratory: No shortness of  breath at rest or with exertion.   No wheezes GastrointestinaI: No nausea, vomiting, diarrhea, abdominal pain, fecal incontinence Genitourinary: No dysuria, urinary retention or frequency.  No nocturia. Musculoskeletal: Mild neck pain.  Notes left greater than right shoulder pain and bilat back pain Integumentary: No rash, pruritus, skin lesions Neurological: as above Psychiatric: No depression at this time.  No anxiety Endocrine: No palpitations, diaphoresis, change in appetite, change in weigh or increased thirst Hematologic/Lymphatic: No anemia, purpura, petechiae. Allergic/Immunologic: No itchy/runny eyes, nasal congestion, recent allergic reactions, rashes  ALLERGIES: Allergies  Allergen Reactions  . Canagliflozin Other (See Comments)    Unknown reaction to Invokana  . Propoxyphene Other (See Comments)    Unknown reaction to Darvocet    HOME MEDICATIONS:  Current Outpatient Medications:  .  albuterol (VENTOLIN HFA) 108 (90 Base) MCG/ACT inhaler, Inhale 2 puffs into the lungs every 6 (six) hours as needed for wheezing or shortness of breath., Disp: , Rfl:  .  alendronate (FOSAMAX) 70 MG tablet, Take 70 mg by mouth every Saturday., Disp: , Rfl: 11 .  amLODipine (NORVASC) 5 MG tablet, Take 5 mg by mouth daily., Disp: , Rfl:  .  diclofenac Sodium (VOLTAREN) 1 % GEL, APPLY 2 G TOPICALLY 3 (THREE) TIMES DAILY. FREQUENCY: DOSAGE:0.0 INSTRUCTIONS: NOTE:, Disp: 200 g, Rfl: 0 .  dorzolamide (TRUSOPT) 2 % ophthalmic solution, Place 1 drop into both eyes 2 (two) times daily., Disp: , Rfl:  .  glucose blood test strip, Use 1 test strip to check blood sugar twice daily, Disp: , Rfl:  .  HYDROcodone-acetaminophen (NORCO) 7.5-325 MG tablet, Take 1 tablet by mouth 2 (two) times daily., Disp: 60 tablet, Rfl: 0 .  Insulin Degludec (TRESIBA) 100 UNIT/ML SOLN, Inject 40 Units into the skin at bedtime. (Patient taking differently: Inject 25 Units into the skin at bedtime.), Disp: , Rfl:  .   lidocaine (LIDODERM) 5 %, Place 1 patch onto the skin daily. Remove & Discard patch within 12 hours or as directed by MD, Disp: 10 patch, Rfl: 0 .  nitrofurantoin, macrocrystal-monohydrate, (MACROBID) 100 MG capsule, Take 100 mg by mouth 2 (two) times daily., Disp: , Rfl:  .  predniSONE (DELTASONE) 5 MG tablet, Take 5 mg by mouth 2 (two) times daily with a meal., Disp: , Rfl:  .  gabapentin (NEURONTIN) 300 MG capsule, Take 1 capsule in the morning, 1 capsules in the afternoon and 2 capsules at night., Disp: 360 capsule, Rfl: 3  PAST MEDICAL HISTORY: Past Medical History:  Diagnosis Date  . Asthma   . Diabetes mellitus without complication (HCC)   . Hypertension   . Lupus (systemic lupus erythematosus) (HCC)   . Neuropathy   . Raynaud's disease   . Vertebral fracture, osteoporotic (HCC)     PAST SURGICAL HISTORY: Past Surgical History:  Procedure Laterality Date  . TONSILLECTOMY      FAMILY HISTORY: History reviewed. No pertinent family history.  SOCIAL HISTORY:  Social History   Socioeconomic History  . Marital status: Divorced    Spouse name: Not on file  . Number of children: Not on file  . Years of education: Not on file  . Highest education level: Not on file  Occupational History  . Not on file  Tobacco Use  . Smoking status: Never Smoker  . Smokeless tobacco: Never Used  Substance and Sexual Activity  . Alcohol use: No    Alcohol/week: 0.0 standard drinks  . Drug use: No  . Sexual activity: Not on file  Other Topics Concern  . Not on file  Social History Narrative  . Not on file   Social Determinants of Health   Financial Resource Strain: Not on file  Food Insecurity: Not on file  Transportation Needs: Not on file  Physical Activity: Not on file  Stress: Not on file  Social Connections: Not on file  Intimate Partner Violence: Not on file     PHYSICAL EXAM  Vitals:   05/17/20 1033  BP: 110/70  Pulse: 96  SpO2: 96%  Weight: 193 lb (87.5 kg)   Height: 5\' 3"  (1.6 m)    Body mass index is 34.19 kg/m.   General: The patient is well-developed and well-nourished and in no acute distress.   Neck:   Mild tenderness in the cervical paraspinal muscles and trapezius muscles.  Musculoskeletal:   She has only mild tenderness over the left subacromial bursae and fairly good ROM.  She has moderate tenderness over the right piriformis muscles and mild over left.    Neurologic Exam  Mental status: The patient is alert and oriented x 3 at the time of the examination. The patient has apparent normal recent and remote memory, with an apparently normal attention span and concentration ability.   Speech is normal.  Cranial nerves: Extraocular movements are full.   Facial  strength and sensation is normal.  Trapezius strength is normal.  Motor:  Muscle bulk is normal.   Tone is normal. Strength is  5 / 5 in all 4 extremities.   Sensory: Intact sensation to touch in the arms and proximal legs.  Mildly reduced vibration sensation in feet  Gait and station: She needs to use her arms to stand up.  The gait is arthritic and wide. She does better with support.  Romberg is negative.  Reflexes: Deep tendon reflexes are symmetric and 1 in arms and absent in legs bilaterally.      ____________________________________________  ASSESSMENT AND PLAN    1. Bilateral sciatica   2. Systemic lupus erythematosus with other organ involvement, unspecified SLE type (HCC)   3. Chronic bilateral low back pain with bilateral sciatica   4. Neck pain   5. Pain of both shoulder joints      1.    right piriformis muscle TPI 40 mg Depo-Medrol in 4 cc lidocaine  She tolerated the procedure well and there were no complications.   2.   Try to stay active and exercise as tolerated. 3.   Continue hydrocodone #60/month.  The PDMP was queried and she only gets opiates from our office.  Renew gabapentin.     4    She will return to see me in 4 months,  sooner if she has  new or worsening neurologic symptoms.   Suriyah Vergara A. Epimenio Foot, MD, PhD 05/17/2020, 11:17 AM Certified in Neurology, Clinical Neurophysiology, Sleep Medicine, Pain Medicine and Neuroimaging  [facility] [address] [place], [place] [address] [phone] p

## 2020-05-29 ENCOUNTER — Other Ambulatory Visit: Payer: Self-pay | Admitting: Neurology

## 2020-06-22 ENCOUNTER — Other Ambulatory Visit: Payer: Self-pay | Admitting: Neurology

## 2020-06-22 NOTE — Telephone Encounter (Signed)
Pt request refill HYDROcodone-acetaminophen (NORCO) 7.5-325 MG tablet at [id number]

## 2020-06-23 MED ORDER — HYDROCODONE-ACETAMINOPHEN 7.5-325 MG PO TABS: 1.0000 | ORAL_TABLET | Freq: Two times a day (BID) | ORAL | 0 refills | Status: DC

## 2020-06-29 ENCOUNTER — Other Ambulatory Visit: Payer: Self-pay | Admitting: Neurology

## 2020-07-26 ENCOUNTER — Other Ambulatory Visit: Payer: Self-pay | Admitting: Neurology

## 2020-08-03 IMAGING — CT CT HEAD W/O CM
3 of 7 series · 12 of 47 positions shown, 14 images · non-contrast
Comparison: Cervical spine MRI 07/31/2017

CLINICAL DATA: 77-year-old with right arm weakness and numbness.
Stroke suspected.

EXAM:
CT HEAD WITHOUT CONTRAST
CT CERVICAL SPINE WITHOUT CONTRAST
TECHNIQUE: Multidetector CT imaging of the head and cervical spine was
performed following the standard protocol without intravenous
contrast. Multiplanar CT image reconstructions of the cervical spine
were also generated.

[Series 5: head 3.0 mpr sag · sagittal · 0.34mm/px · 1 of 54 slices shown]
[im 27/54  brain]
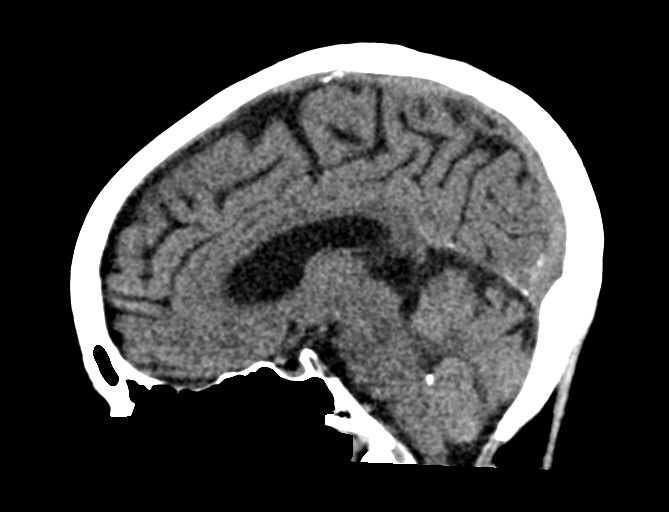

[Series 9: coronals · coronal · 0.22mm/px · 3 of 53 slices shown]
[im 14/53  brain]
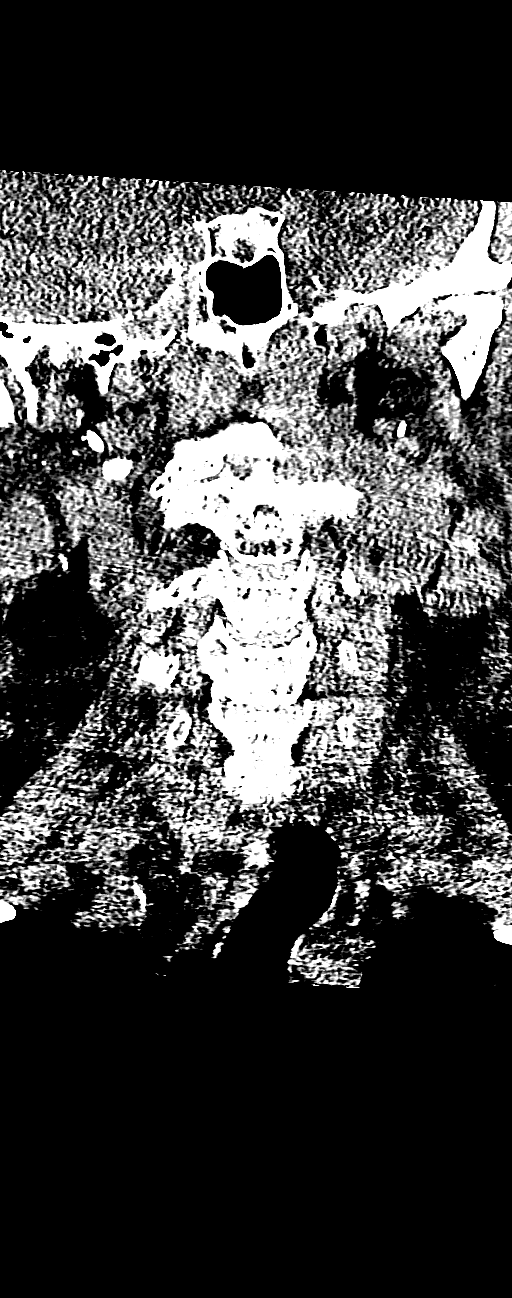
[im 27/53  brain]
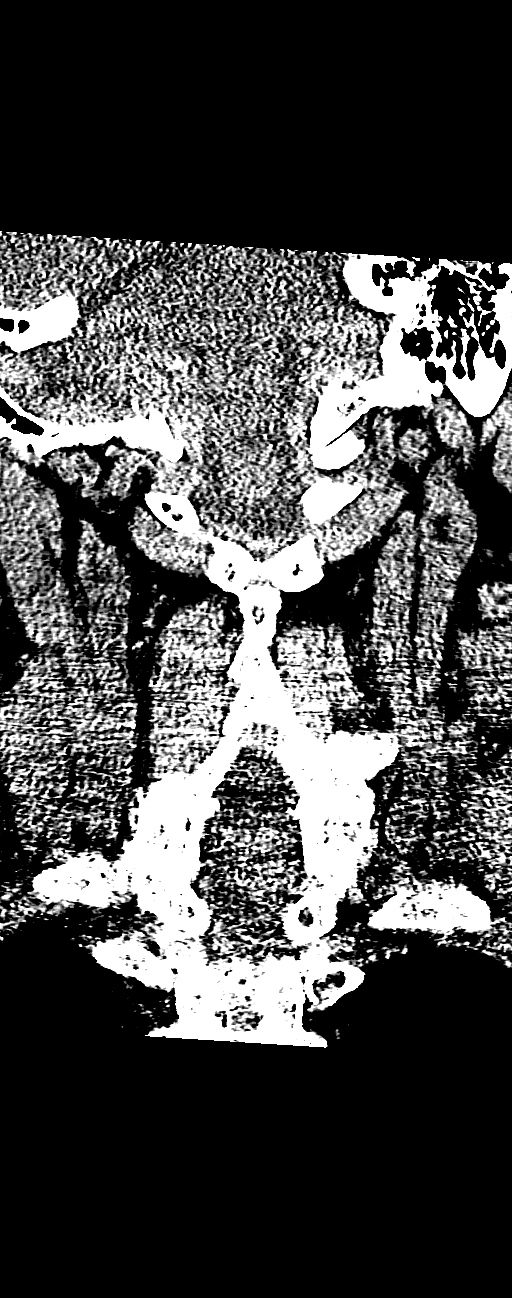
[im 40/53  brain]
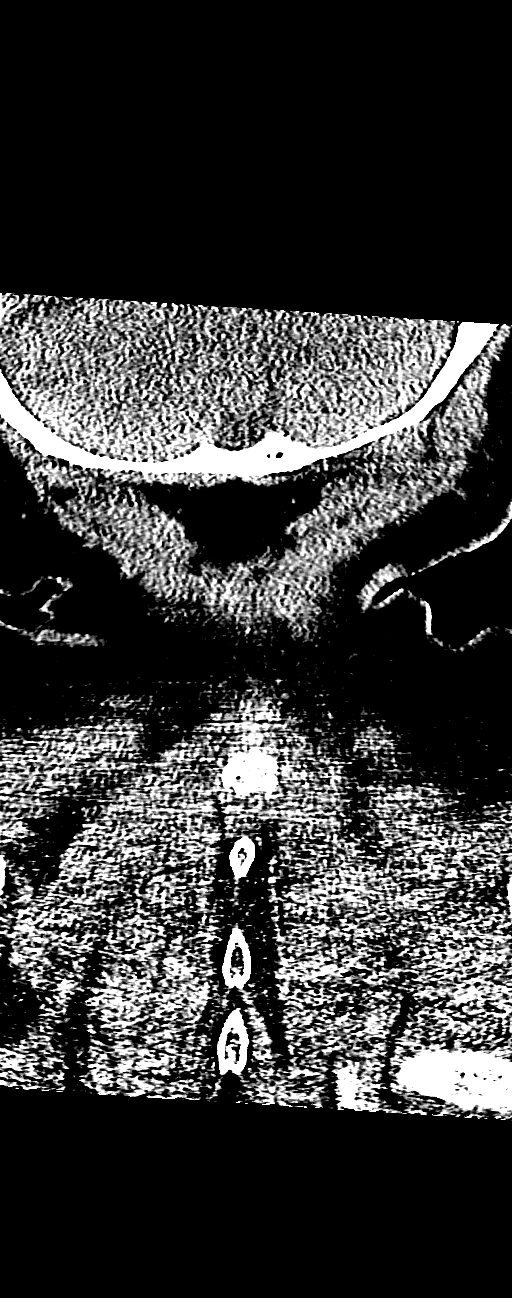

[Series 11: orthogonals · axial · 0.21mm/px · z∈[-321,-182]mm · 8 of 94 slices shown, 10 images]
[im 8/94  brain]
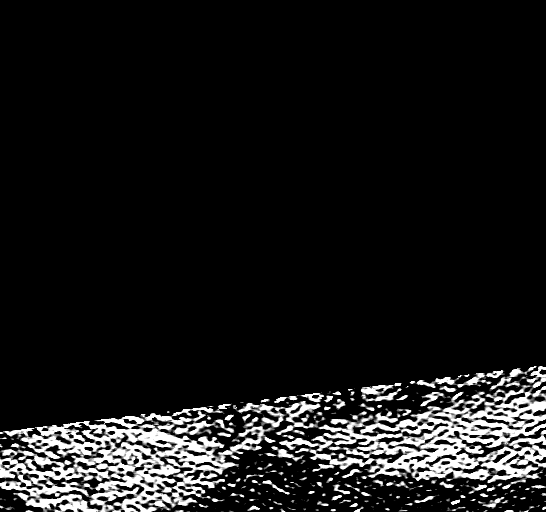
[im 8/94  bone]
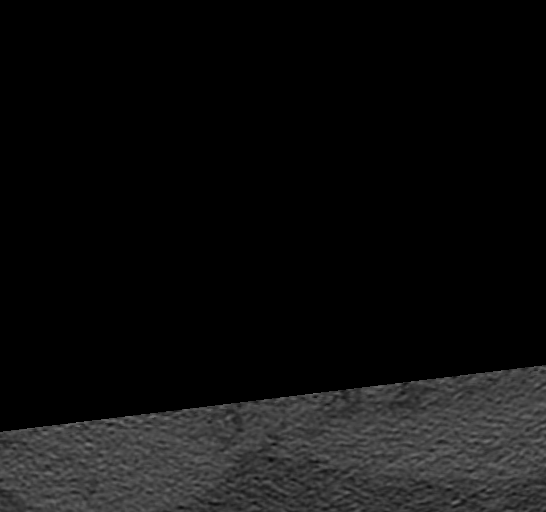
[im 22/94  brain]
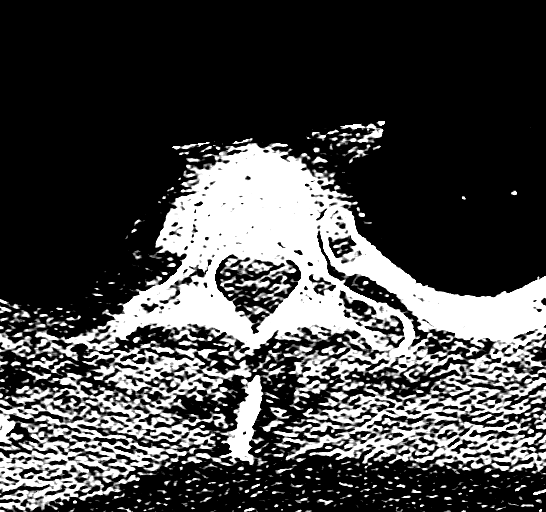
[im 29/94  brain]
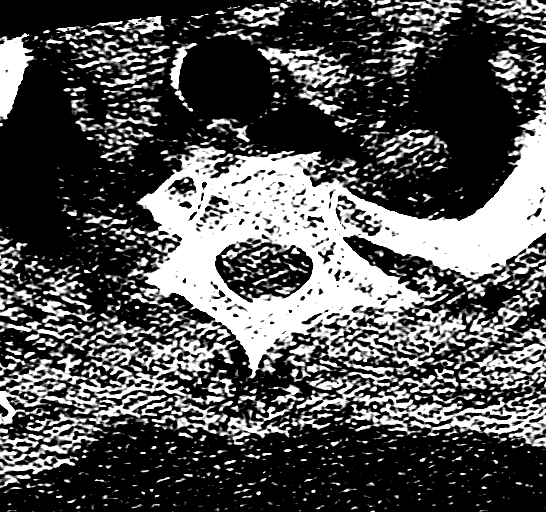
[im 43/94  brain]
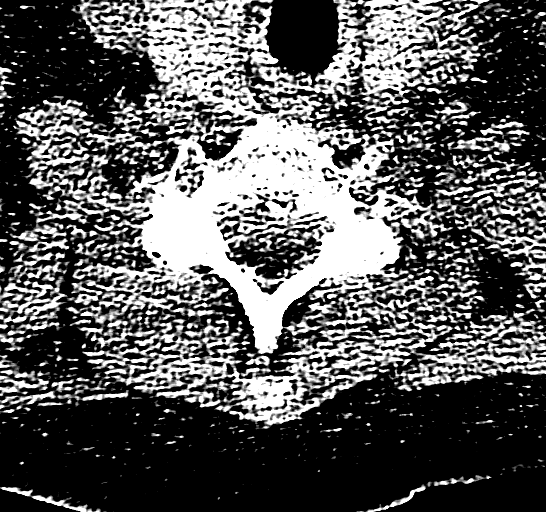
[im 51/94  brain]
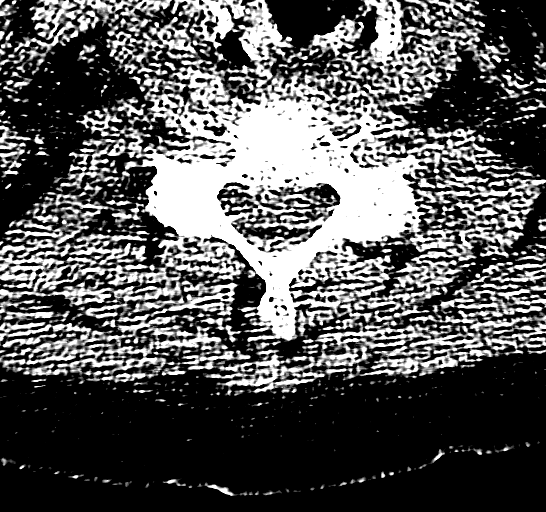
[im 51/94  bone]
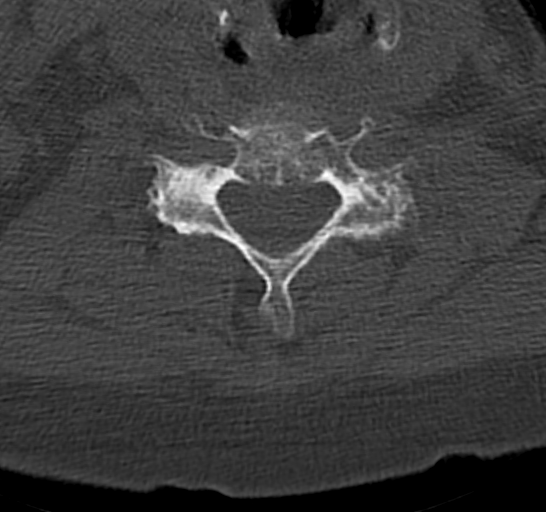
[im 65/94  brain]
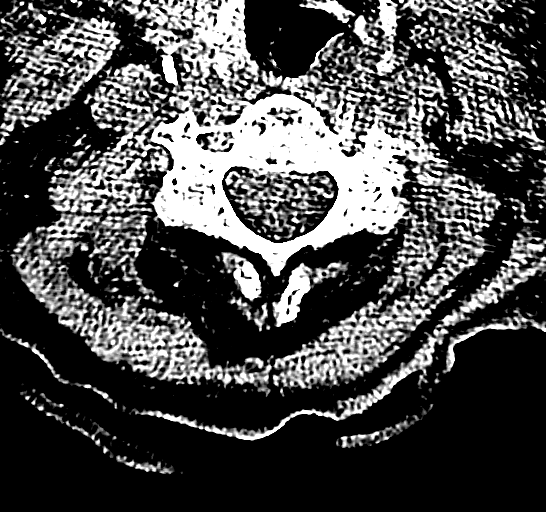
[im 72/94  brain]
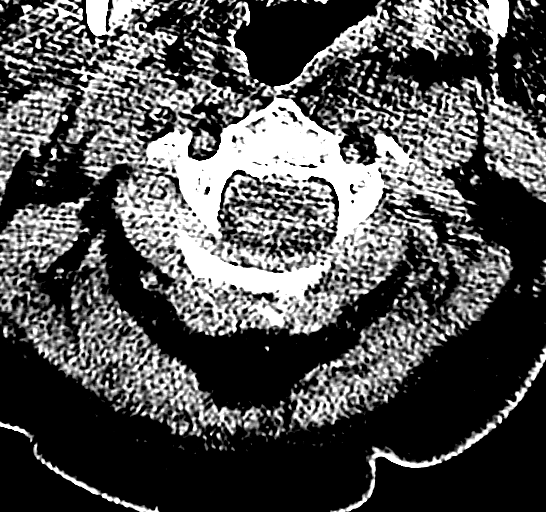
[im 86/94  brain]
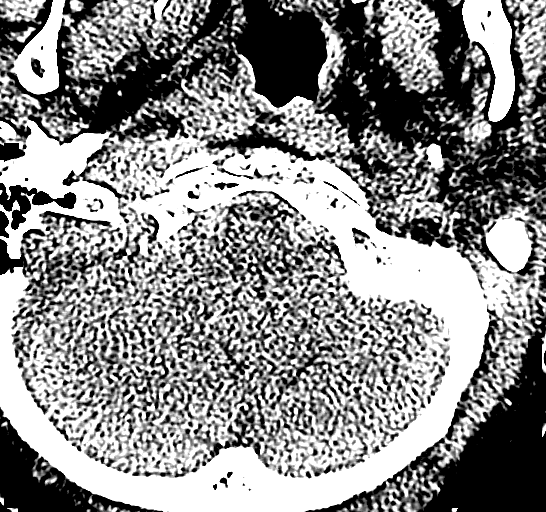

[12 of 47 positions shown; findings below may reference images not displayed]

FINDINGS: CT HEAD FINDINGS

Brain: Mild cerebral atrophy. No evidence for acute hemorrhage, mass
lesion, midline shift, hydrocephalus or large infarct. Subtle
low-density in the periventricular white matter.

Vascular: No hyperdense vessel or unexpected calcification.

Skull: Normal. Negative for fracture or focal lesion.

Sinuses/Orbits: Small amount of focal disease in the left sphenoid
sinus.

Other: None.

CT CERVICAL SPINE FINDINGS

Alignment: Again noted is minimal anterolisthesis at C4-C5.

Skull base and vertebrae: No acute fracture. No primary bone lesion
or focal pathologic process.

Soft tissues and spinal canal: Scattered calcifications throughout
the bilateral parotid glands, right side greater than left. No
paravertebral soft tissue abnormality. Thyroid tissue is mildly
prominent.

Disc levels: Severe multilevel degenerative facet disease. Ankylosis
of the right C2-C3 facets. Degenerative changes along the right side
of the occiput and C1. Prominent central disc bulge or disc
osteophyte complex at C5-C6.

Upper chest: Negative.

Other: None
IMPRESSION: No acute intracranial abnormality.

Multilevel degenerative disease in the cervical spine without acute
bone abnormality.

## 2020-08-08 ENCOUNTER — Other Ambulatory Visit: Payer: Self-pay | Admitting: Neurology

## 2020-08-08 MED ORDER — HYDROCODONE-ACETAMINOPHEN 7.5-325 MG PO TABS: 1.0000 | ORAL_TABLET | Freq: Two times a day (BID) | ORAL | 0 refills | Status: DC

## 2020-08-08 NOTE — Telephone Encounter (Signed)
Pt called needing a refill on her HYDROcodone-acetaminophen (NORCO) 7.5-325 MG tablet sent in to the [facility] on [address]

## 2020-09-17 ENCOUNTER — Emergency Department (HOSPITAL_BASED_OUTPATIENT_CLINIC_OR_DEPARTMENT_OTHER): Payer: Medicare Other

## 2020-09-17 ENCOUNTER — Encounter (HOSPITAL_BASED_OUTPATIENT_CLINIC_OR_DEPARTMENT_OTHER): Payer: Self-pay | Admitting: Emergency Medicine

## 2020-09-17 ENCOUNTER — Emergency Department (HOSPITAL_BASED_OUTPATIENT_CLINIC_OR_DEPARTMENT_OTHER)
Admission: EM | Admit: 2020-09-17 | Discharge: 2020-09-17 | Disposition: A | Discharge status: Home / Self Care | Payer: Medicare Other | Attending: Emergency Medicine | Admitting: Emergency Medicine

## 2020-09-17 ENCOUNTER — Other Ambulatory Visit: Payer: Self-pay

## 2020-09-17 DIAGNOSIS — E119 Type 2 diabetes mellitus without complications: Secondary | ICD-10-CM | POA: Insufficient documentation

## 2020-09-17 DIAGNOSIS — J45909 Unspecified asthma, uncomplicated: Secondary | ICD-10-CM | POA: Diagnosis not present

## 2020-09-17 DIAGNOSIS — Z794 Long term (current) use of insulin: Secondary | ICD-10-CM | POA: Insufficient documentation

## 2020-09-17 DIAGNOSIS — I10 Essential (primary) hypertension: Secondary | ICD-10-CM | POA: Insufficient documentation

## 2020-09-17 DIAGNOSIS — M7501 Adhesive capsulitis of right shoulder: Secondary | ICD-10-CM | POA: Diagnosis not present

## 2020-09-17 DIAGNOSIS — R600 Localized edema: Secondary | ICD-10-CM | POA: Insufficient documentation

## 2020-09-17 DIAGNOSIS — Z79899 Other long term (current) drug therapy: Secondary | ICD-10-CM | POA: Diagnosis not present

## 2020-09-17 DIAGNOSIS — M25511 Pain in right shoulder: Secondary | ICD-10-CM | POA: Diagnosis present

## 2020-09-17 MED ORDER — CELECOXIB 200 MG PO CAPS: ORAL_CAPSULE | ORAL | 0 refills | Status: AC

## 2020-09-17 NOTE — ED Provider Notes (Signed)
[facility] Provider Note: Lowella Dell, MD, FACEP  CSN: [id number] MRN: [id number] ARRIVAL: 09/17/20 at 0447 ROOM: [facility]07/[facility]07   CHIEF COMPLAINT  Shoulder Pain and Foot Swelling   HISTORY OF PRESENT ILLNESS  09/17/20 5:19 AM Kerry Brooks is a 80 y.o. female with a history of diabetes and lupus.  She is on chronic hydrocodone therapy receiving 37.5/325 tablets every 1 to 2 months.  She fell out of bed on May 9 of this year and injured her right shoulder.  She has had pain in her right shoulder since and the shoulder has become immobile.  She rates the pain in her right shoulder currently as a 10 out of 10 despite being on hydrocodone.  She has difficulty moving her right elbow but does have intact movement of her wrist and fingers.  She states the right shoulder was never x-rayed and I find no record of any shoulder x-ray since 2019 and her medical records.  She also states that since the fall her feet have been swelling.  They do not hurt but feel tight.  The swelling improves when she is supine but worsens when she is standing or walking.  She has tried support hose without satisfactory relief.  She is already on Lasix 20 mg daily, prescribed 08/29/2020.  Her BUN and creatinine are 17 and 0.7 as of 08/23/2020.   Past Medical History:  Diagnosis Date   Asthma    Diabetes mellitus without complication (HCC)    Hypertension    Lupus (systemic lupus erythematosus) (HCC)    Neuropathy    Raynaud's disease    Vertebral fracture, osteoporotic (HCC)     Past Surgical History:  Procedure Laterality Date   ABDOMINAL HYSTERECTOMY     TONSILLECTOMY      No family history on file.  Social History   Tobacco Use   Smoking status: Never   Smokeless tobacco: Never  Substance Use Topics   Alcohol use: No    Alcohol/week: 0.0 standard drinks   Drug use: No    Prior to Admission medications   Medication Sig Start Date End Date Taking? Authorizing Provider  celecoxib  (CELEBREX) 200 MG capsule Take once to twice daily as needed for shoulder pain. 09/17/20  Yes Aspin Palomarez, MD  albuterol (VENTOLIN HFA) 108 (90 Base) MCG/ACT inhaler Inhale 2 puffs into the lungs every 6 (six) hours as needed for wheezing or shortness of breath.    [provider]  alendronate (FOSAMAX) 70 MG tablet Take 70 mg by mouth every Saturday. 10/24/17   [provider]  amLODipine (NORVASC) 5 MG tablet Take 5 mg by mouth daily. 08/09/16   [provider]  diclofenac Sodium (VOLTAREN) 1 % GEL APPLY 2 G TOPICALLY 3 (THREE) TIMES DAILY. FREQUENCY: DOSAGE:0.0 INSTRUCTIONS: NOTE: 07/26/20   Sater, Pearletha Furl, MD  dorzolamide (TRUSOPT) 2 % ophthalmic solution Place 1 drop into both eyes 2 (two) times daily. 05/07/19   [provider]  gabapentin (NEURONTIN) 300 MG capsule Take 1 capsule in the morning, 1 capsules in the afternoon and 2 capsules at night. 05/17/20   Sater, Pearletha Furl, MD  glucose blood test strip Use 1 test strip to check blood sugar twice daily 03/20/16   [provider]  HYDROcodone-acetaminophen (NORCO) 7.5-325 MG tablet Take 1 tablet by mouth 2 (two) times daily. 08/08/20   Sater, Pearletha Furl, MD  Insulin Degludec (TRESIBA) 100 UNIT/ML SOLN Inject 40 Units into the skin at bedtime. Patient taking  differently: Inject 25 Units into the skin at bedtime. 11/12/17   Tyrone Nine, MD  lidocaine (LIDODERM) 5 % Place 1 patch onto the skin daily. Remove & Discard patch within 12 hours or as directed by MD 03/22/20   Maxwell Caul, PA-C  nitrofurantoin, macrocrystal-monohydrate, (MACROBID) 100 MG capsule Take 100 mg by mouth 2 (two) times daily. 03/17/20   [provider]  predniSONE (DELTASONE) 5 MG tablet Take 5 mg by mouth 2 (two) times daily with a meal.    [provider]    Allergies Canagliflozin and Propoxyphene   REVIEW OF SYSTEMS  Negative except as noted here or in the History of Present Illness.   PHYSICAL  EXAMINATION  Initial Vital Signs Blood pressure (!) 176/97, pulse (!) 106, temperature 98 F (36.7 C), temperature source Oral, resp. rate 20, height 5\' 3"  (1.6 m), weight 92.1 kg, SpO2 96 %.  Examination General: Well-developed, well-nourished female in no acute distress; appearance consistent with age of record HENT: normocephalic; atraumatic Eyes: pupils equal, round and reactive to light; extraocular muscles intact Neck: supple Heart: regular rate and rhythm Lungs: clear to auscultation bilaterally Abdomen: soft; nondistended; nontender; bowel sounds present Extremities: Tender, immobile right shoulder; limited flexion strength of the right elbow with intact wrist and finger function; 2+ edema of the lower legs and feet Neurologic: Awake, alert and oriented; motor function intact in all extremities and symmetric; no facial droop Skin: Warm and dry Psychiatric: Normal mood and affect   RESULTS  Summary of this visit's results, reviewed and interpreted by myself:   EKG Interpretation  Date/Time:    Ventricular Rate:    PR Interval:    QRS Duration:   QT Interval:    QTC Calculation:   R Axis:     Text Interpretation:         Laboratory Studies: No results found for this or any previous visit (from the past 24 hour(s)). Imaging Studies: DG Shoulder Right  Result Date: 09/17/2020 CLINICAL DATA:  Fall 2 months ago with persistent shoulder immobility. EXAM: RIGHT SHOULDER - 2+ VIEW COMPARISON:  11/07/2017 FINDINGS: No evidence for an acute fracture. No findings to suggest shoulder separation or dislocation. Degenerative changes are seen at the acromioclavicular joint. Advanced degenerative changes are noted in the glenohumeral joint. Visualized portions of the right lung show diffuse interstitial opacity. IMPRESSION: 1. No acute bony abnormality. 2. 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 3. Diffuse interstitial opacity in the right lung. Electronically Signed    By: 11/09/2017 M.D.   On: 09/17/2020 06:02    ED COURSE and MDM  Nursing notes, initial and subsequent vitals signs, including pulse oximetry, reviewed and interpreted by myself.  Vitals:   09/17/20 0501 09/17/20 0502 09/17/20 0600  BP: (!) 176/97  (!) 168/92  Pulse: (!) 106  (!) 101  Resp: 20  20  Temp: 98 F (36.7 C)    TempSrc: Oral    SpO2: 96%  95%  Weight:  92.1 kg   Height:  5\' 3"  (1.6 m)    Medications - No data to display  6:21 AM The patient and her daughter were advised of the x-ray findings of the patient's shoulder.  She essentially has a frozen shoulder at this point and needs to see an orthopedist, possibly 1 that performs shoulder replacements.  Her current orthopedist, Dr. 09/19/20, is a foot and ankle specialist.  They were advised he may be able to refer them to someone specializing  in shoulders.  We will also supply the name of our orthopedist on-call, Dr. August Saucer.  She is already on chronic narcotic therapy we will add Celebrex as she may benefit from NSAID treatment.  As noted above her kidney function remains excellent.  She is currently on Lasix 20 mg daily.  She was advised to try taking 40 mg on days that her legs are particularly swollen.  I do not wish to routinely change her medication regimen as she is being managed by multiple different physicians including primary care and cardiology.  PROCEDURES  Procedures   ED DIAGNOSES     ICD-10-CM   1. 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 Bilateral lower extremity edema  R60.0          Ilyanna Baillargeon, Jonny Ruiz, MD 09/17/20 [phone]

## 2020-09-17 NOTE — ED Triage Notes (Addendum)
Pt reports fall from bed on May 9th; she says she's been having problems with her right shoulder and feet swelling ever since; CMS intact, edema in bilateral feet; denies [facility]

## 2020-09-20 ENCOUNTER — Other Ambulatory Visit: Payer: Self-pay | Admitting: Neurology

## 2020-09-20 MED ORDER — HYDROCODONE-ACETAMINOPHEN 7.5-325 MG PO TABS: 1.0000 | ORAL_TABLET | Freq: Two times a day (BID) | ORAL | 0 refills | Status: DC

## 2020-09-20 NOTE — Telephone Encounter (Signed)
Pt is needing a refill for her HYDROcodone-acetaminophen (NORCO) 7.5-325 MG [facility].

## 2020-10-05 ENCOUNTER — Ambulatory Visit (INDEPENDENT_AMBULATORY_CARE_PROVIDER_SITE_OTHER): Payer: Medicare Other | Admitting: Neurology

## 2020-10-05 ENCOUNTER — Other Ambulatory Visit: Payer: Self-pay

## 2020-10-05 ENCOUNTER — Encounter: Payer: Self-pay | Admitting: Neurology

## 2020-10-05 VITALS — BP 170/97 | HR 91 | Ht 63.0 in | Wt 190.0 lb

## 2020-10-05 DIAGNOSIS — M3219 Other organ or system involvement in systemic lupus erythematosus: Secondary | ICD-10-CM | POA: Diagnosis not present

## 2020-10-05 DIAGNOSIS — M542 Cervicalgia: Secondary | ICD-10-CM | POA: Diagnosis not present

## 2020-10-05 DIAGNOSIS — M5431 Sciatica, right side: Secondary | ICD-10-CM

## 2020-10-05 DIAGNOSIS — M25511 Pain in right shoulder: Secondary | ICD-10-CM

## 2020-10-05 DIAGNOSIS — E1142 Type 2 diabetes mellitus with diabetic polyneuropathy: Secondary | ICD-10-CM

## 2020-10-05 DIAGNOSIS — M5432 Sciatica, left side: Secondary | ICD-10-CM

## 2020-10-05 DIAGNOSIS — Z794 Long term (current) use of insulin: Secondary | ICD-10-CM

## 2020-10-05 MED ORDER — DICLOFENAC SODIUM 1 % EX GEL: CUTANEOUS | 3 refills | Status: AC

## 2020-10-05 NOTE — Progress Notes (Signed)
[facility]  PATIENT: Kerry Brooks DOB: 1941/01/02  REFERRING DOCTOR OR PCP:  Brooke Bonito  SOURCE: patient  _________________________________   HISTORICAL  CHIEF COMPLAINT:  Chief Complaint  Patient presents with   Follow-up    RM 1, alone. Last seen 05/17/20. Follow up on LBP/neck pain. Larey Seat 07/03/20 and hurt right shoulder. She may have to have surgery.     HISTORY OF PRESENT ILLNESS:  Kerry Brooks is a 80 y.o. woman with LBP, left shoulder pain and knee pain.    05/17/2020: She fell 07/03/2020 and hurt her right shoulder.  She went to the ED [facility])and was diagnosed with capsulitis/frozen shoulder.    She has not seen Orthopedics.  She has some LBP/sciatica but it is better than last visit.      She is reporting pain in both the lower lumbar region and piriformis region.   TPIs in the lumbar paraspinal muscles and/or piriformis muscle in the past helped the pain.  She has osteoporosis and we discussed that steroids might worsen this.  We have done TPI or joint injections (with steroid) about 3 times a year and she is on daily Flonase as well.   She is on alendronate weekly.      She is on hydrocodone 7.5 mg bid for her chronic musculoskeletal pain  This has helped the most.  No drug seeking behavior.   She also has a benefit from Voltaren gel.    She also has SLE and was on Actemra infusions   She also has DM and reports sugars have been generally well controlled this month.      REVIEW OF SYSTEMS: Constitutional: No fevers, chills, sweats, or change in appetite Eyes: No visual changes, double vision, eye pain Ear, nose and throat: No hearing loss, ear pain, nasal congestion, sore throat Cardiovascular: No chest pain, palpitations Respiratory:  No shortness of breath at rest or with exertion.   No wheezes GastrointestinaI: No nausea, vomiting, diarrhea, abdominal pain, fecal incontinence Genitourinary:  No dysuria, urinary  retention or frequency.  No nocturia. Musculoskeletal:  Mild neck pain.  Notes shoulder pain and bilat back pain Integumentary: No rash, pruritus, skin lesions Neurological: as above Psychiatric: No depression at this time.  No anxiety Endocrine: No palpitations, diaphoresis, change in appetite, change in weigh or increased thirst Hematologic/Lymphatic:  No anemia, purpura, petechiae. Allergic/Immunologic: No itchy/runny eyes, nasal congestion, recent allergic reactions, rashes  ALLERGIES: Allergies  Allergen Reactions   Canagliflozin Other (See Comments)    Unknown reaction to Invokana   Propoxyphene Other (See Comments)    Unknown reaction to Darvocet    HOME MEDICATIONS:  Current Outpatient Medications:    albuterol (VENTOLIN HFA) 108 (90 Base) MCG/ACT inhaler, Inhale 2 puffs into the lungs every 6 (six) hours as needed for wheezing or shortness of breath., Disp: , Rfl:    carvedilol (COREG) 25 MG tablet, Take 25 mg by mouth 2 (two) times daily with a meal., Disp: , Rfl:    celecoxib (CELEBREX) 200 MG capsule, Take once to twice daily as needed for shoulder pain., Disp: 30 capsule, Rfl: 0   dorzolamide (TRUSOPT) 2 % ophthalmic solution, Place 1 drop into both eyes 2 (two) times daily., Disp: , Rfl:    furosemide (LASIX) 20 MG tablet, Take 20 mg by mouth daily., Disp: , Rfl:    gabapentin (NEURONTIN) 300 MG capsule, Take 1 capsule in the morning, 1 capsules in the afternoon and 2 capsules at night., Disp:  360 capsule, Rfl: 3   glucose blood test strip, Use 1 test strip to check blood sugar twice daily, Disp: , Rfl:    HYDROcodone-acetaminophen (NORCO) 7.5-325 MG tablet, Take 1 tablet by mouth 2 (two) times daily., Disp: 60 tablet, Rfl: 0   Insulin Degludec (TRESIBA) 100 UNIT/ML SOLN, Inject 40 Units into the skin at bedtime. (Patient taking differently: Inject 25 Units into the skin at bedtime.), Disp: , Rfl:    losartan (COZAAR) 100 MG tablet, Take 100 mg by mouth daily., Disp: ,  Rfl:    alendronate (FOSAMAX) 70 MG tablet, Take 70 mg by mouth every Saturday. (Patient not taking: Reported on 10/05/2020), Disp: , Rfl: 11   diclofenac Sodium (VOLTAREN) 1 % GEL, Apply to right shoulder once or twice a day, Disp: 100 g, Rfl: 3   lidocaine (LIDODERM) 5 %, Place 1 patch onto the skin daily. Remove & Discard patch within 12 hours or as directed by MD (Patient not taking: Reported on 10/05/2020), Disp: 10 patch, Rfl: 0   nitrofurantoin, macrocrystal-monohydrate, (MACROBID) 100 MG capsule, Take 100 mg by mouth 2 (two) times daily. (Patient not taking: Reported on 10/05/2020), Disp: , Rfl:    predniSONE (DELTASONE) 5 MG tablet, Take 5 mg by mouth 2 (two) times daily with a meal. (Patient not taking: Reported on 10/05/2020), Disp: , Rfl:   PAST MEDICAL HISTORY: Past Medical History:  Diagnosis Date   Asthma    Diabetes mellitus without complication (HCC)    Hypertension    Lupus (systemic lupus erythematosus) (HCC)    Neuropathy    Raynaud's disease    Vertebral fracture, osteoporotic (HCC)     PAST SURGICAL HISTORY: Past Surgical History:  Procedure Laterality Date   ABDOMINAL HYSTERECTOMY     TONSILLECTOMY      FAMILY HISTORY: History reviewed. No pertinent family history.  SOCIAL HISTORY:  Social History   Socioeconomic History   Marital status: Divorced    Spouse name: Not on file   Number of children: Not on file   Years of education: Not on file   Highest education level: Not on file  Occupational History   Not on file  Tobacco Use   Smoking status: Never   Smokeless tobacco: Never  Substance and Sexual Activity   Alcohol use: No    Alcohol/week: 0.0 standard drinks   Drug use: No   Sexual activity: Not on file  Other Topics Concern   Not on file  Social History Narrative   Not on file   Social Determinants of Health   Financial Resource Strain: Not on file  Food Insecurity: Not on file  Transportation Needs: Not on file  Physical Activity:  Not on file  Stress: Not on file  Social Connections: Not on file  Intimate Partner Violence: Not on file     PHYSICAL EXAM  Vitals:   10/05/20 1106  BP: (!) 170/97  Pulse: 91  Weight: 190 lb (86.2 kg)  Height: 5\' 3"  (1.6 m)    Body mass index is 33.66 kg/m.   General: The patient is well-developed and well-nourished and in no acute distress.   Neck/Musculoskeletal::   Moderate tenderness over right glenohumeral joint and mild over subacromial bursa.   Mild tenderness in the cervical paraspinal muscles and trapezius muscles.   Reduced ROM in right shoulder.   Only mild piriformis tendernsss.   Has mild edema at ankles.    Neurologic Exam  Mental status: The patient is alert and oriented x  3 at the time of the examination. The patient has apparent normal recent and remote memory, with an apparently normal attention span and concentration ability.   Speech is normal.  Cranial nerves: Extraocular movements are full.   Facial strength and sensation is normal.  Trapezius strength is normal.  Motor:  Muscle bulk is normal.   Tone is normal. Strength is  5 / 5 in all 4 extremities.   Sensory: Intact sensation to touch in the arms and proximal legs.  Mildly reduced vibration sensation in feet  Gait and station: She needs to use her arms to stand up.  The gait is arthritic and wide. She does better with support.  Romberg is negative  Reflexes: Deep tendon reflexes are symmetric and 1 in arms and absent in legs bilaterally.      ____________________________________________  ASSESSMENT AND PLAN    1. Right shoulder pain, unspecified chronicity   2. Bilateral sciatica   3. Neck pain   4. Systemic lupus erythematosus with other organ involvement, unspecified SLE type (HCC)   5. Type 2 diabetes mellitus with diabetic polyneuropathy, with long-term current use of insulin (HCC)       1.    right glenohumeral joint injection with 40 mg Depo-Medrol in 2.5 cc lidocaine  She  tolerated the procedure well and there were no complications.     If not much better refer to Ortho [facility]) 2.   Try to stay active and exercise as tolerated. 3.   Continue hydrocodone #60/month.  The PDMP was queried and she only gets opiates from our office.  Renew gabapentin.     4    She will return to see me in 4 months,  sooner if she has new or worsening neurologic symptoms.   Tomeka Kantner A. Epimenio Foot, MD, PhD 10/05/2020, 3:27 PM Certified in Neurology, Clinical Neurophysiology, Sleep Medicine, Pain Medicine and Neuroimaging  [facility] [address] [place], [place] [address] [phone] p

## 2020-11-06 ENCOUNTER — Other Ambulatory Visit: Payer: Self-pay | Admitting: Neurology

## 2020-11-06 MED ORDER — HYDROCODONE-ACETAMINOPHEN 7.5-325 MG PO TABS: 1.0000 | ORAL_TABLET | Freq: Two times a day (BID) | ORAL | 0 refills | Status: DC

## 2020-11-06 NOTE — Telephone Encounter (Signed)
Pt called needing a refill request for her HYDROcodone-acetaminophen (NORCO) 7.5-325 MG tablet [facility] [address] [facility].

## 2020-11-06 NOTE — Telephone Encounter (Signed)
Last filled on 09/20/20 for #60.   Last OV: 10/05/20 Next visit: 03/08/21

## 2020-11-28 ENCOUNTER — Ambulatory Visit: Payer: Medicare Other | Admitting: Physical Therapy

## 2021-01-26 ENCOUNTER — Other Ambulatory Visit: Payer: Self-pay | Admitting: Neurology

## 2021-01-26 MED ORDER — HYDROCODONE-ACETAMINOPHEN 7.5-325 MG PO TABS: 1.0000 | ORAL_TABLET | Freq: Two times a day (BID) | ORAL | 0 refills | Status: DC

## 2021-01-26 NOTE — Telephone Encounter (Signed)
Pt requesting a refill on her HYDROcodone-acetaminophen (NORCO) 7.5-325 MG tablet. [id number] [place], [place] - [address]

## 2021-03-08 ENCOUNTER — Encounter: Payer: Self-pay | Admitting: Neurology

## 2021-03-08 ENCOUNTER — Ambulatory Visit: Payer: Medicare Other | Admitting: Neurology

## 2021-03-14 ENCOUNTER — Telehealth: Payer: Self-pay | Admitting: Neurology

## 2021-03-14 NOTE — Telephone Encounter (Signed)
Pt has called to inform Dr Epimenio Foot that she hates that she missed her appointment but she was in [facility] for 5 days and is currently in [facility], pt will call to schedule an appointment when she gets home.  This is FYI no call back requested

## 2021-04-06 ENCOUNTER — Other Ambulatory Visit: Payer: Self-pay | Admitting: Neurology

## 2021-04-06 NOTE — Telephone Encounter (Signed)
Pt  request refill for HYDROcodone-acetaminophen (NORCO) 7.5-325 MG tablet at [id number] 

## 2021-04-09 MED ORDER — HYDROCODONE-ACETAMINOPHEN 7.5-325 MG PO TABS: 1.0000 | ORAL_TABLET | Freq: Two times a day (BID) | ORAL | 0 refills | Status: DC

## 2021-05-15 ENCOUNTER — Other Ambulatory Visit: Payer: Self-pay | Admitting: Neurology

## 2021-05-15 MED ORDER — HYDROCODONE-ACETAMINOPHEN 7.5-325 MG PO TABS: 1.0000 | ORAL_TABLET | Freq: Two times a day (BID) | ORAL | 0 refills | Status: DC

## 2021-05-15 NOTE — Telephone Encounter (Signed)
Pt  request refill for HYDROcodone-acetaminophen (NORCO) 7.5-325 MG tablet at [id number] 

## 2021-05-30 ENCOUNTER — Encounter: Payer: Self-pay | Admitting: Neurology

## 2021-05-30 ENCOUNTER — Ambulatory Visit (INDEPENDENT_AMBULATORY_CARE_PROVIDER_SITE_OTHER): Payer: Medicare Other | Admitting: Neurology

## 2021-05-30 VITALS — BP 138/64 | HR 77 | Ht 63.0 in | Wt 187.0 lb

## 2021-05-30 DIAGNOSIS — M5431 Sciatica, right side: Secondary | ICD-10-CM | POA: Diagnosis not present

## 2021-05-30 DIAGNOSIS — M542 Cervicalgia: Secondary | ICD-10-CM | POA: Diagnosis not present

## 2021-05-30 DIAGNOSIS — Z794 Long term (current) use of insulin: Secondary | ICD-10-CM

## 2021-05-30 DIAGNOSIS — M25511 Pain in right shoulder: Secondary | ICD-10-CM | POA: Diagnosis not present

## 2021-05-30 DIAGNOSIS — M5432 Sciatica, left side: Secondary | ICD-10-CM

## 2021-05-30 DIAGNOSIS — M3219 Other organ or system involvement in systemic lupus erythematosus: Secondary | ICD-10-CM

## 2021-05-30 DIAGNOSIS — E1142 Type 2 diabetes mellitus with diabetic polyneuropathy: Secondary | ICD-10-CM

## 2021-05-30 NOTE — Progress Notes (Signed)
? ?[facility] ? ?PATIENT: Kerry Brooks ?DOB: Jul 15, 1940 ? ?REFERRING DOCTOR OR PCP:  Reita Cliche  ?SOURCE: patient ? ?_________________________________ ? ? ?HISTORICAL ? ?CHIEF COMPLAINT:  ?Chief Complaint  ?Patient presents with  ? Follow-up  ?  RM 2, alone. In wheelchair. Able to stand to obtain weight. Also uses cane. Has sore spot on top of head, denies any injuries. Reports no falls.   ? ? ?HISTORY OF PRESENT ILLNESS:  ?Kerry Brooks is a 81 y.o. woman with LBP, left shoulder pain and knee pain.   ? ?05/30/2021: ?She was hospitalized Christmas day.   She felt weaker and was diagnosed with CHF.   SHe did Rehab at [facility].     She has right > left leg edema.   SHe is supposed to reduced fluid intake.   She has an electric bed now and she elevates her legs at night ? ?Sh has a sore spot on her head.  She is on gabapentin for pain.   DUe to reduced GFR her dose was reduced and is now 300-300-600 mg.   ? ?She fell last year and hurt her shoulder.   It still hurts her.  We had sone an injecitn in the past - unsure if it helped.   ? ?Her PCP just moved away and she needs to find a new doctor. ?  ?She has back pain, worse if she stands up a long time.     She is reporting pain in both the lower lumbar region and piriformis region.   TPIs in the lumbar paraspinal muscles and/or piriformis muscle in the past helped the pain when it was more severe.  She has osteoporosis and we discussed that steroids might worsen this.  We have done TPI or joint injections (with steroid) about 3 times a year and she is on daily Flonase as well.   She is on alendronate weekly.     ? ?She is on hydrocodone 7.5 mg bid for her chronic musculoskeletal pain  This has helped the most.  She has no drug seeking behavior.   She also has a benefit from Voltaren gel.    She also has SLE and is on prednisone   She also has DM and reports sugars have been generally well controlled this month.   ? ?  ?REVIEW OF  SYSTEMS: ?Constitutional: No fevers, chills, sweats, or change in appetite ?Eyes: No visual changes, double vision, eye pain ?Ear, nose and throat: No hearing loss, ear pain, nasal congestion, sore throat ?Cardiovascular: No chest pain, palpitations ?Respiratory:  No shortness of breath at rest or with exertion.   No wheezes ?GastrointestinaI: No nausea, vomiting, diarrhea, abdominal pain, fecal incontinence ?Genitourinary:  No dysuria, urinary retention or frequency.  No nocturia. ?Musculoskeletal:  neck pain.  Notes shoulder pain and bilat back pain ?Integumentary: No rash, pruritus, skin lesions ?Neurological: as above ?Psychiatric: No depression at this time.  No anxiety ?Endocrine: No palpitations, diaphoresis, change in appetite, change in weigh or increased thirst ?Hematologic/Lymphatic:  No anemia, purpura, petechiae. ?Allergic/Immunologic: No itchy/runny eyes, nasal congestion, recent allergic reactions, rashes ? ?ALLERGIES: ?Allergies  ?Allergen Reactions  ? Canagliflozin Other (See Comments)  ?  Unknown reaction to Invokana  ? Propoxyphene Other (See Comments)  ?  Unknown reaction to Darvocet  ? ? ?HOME MEDICATIONS: ? ?Current Outpatient Medications:  ?  albuterol (VENTOLIN HFA) 108 (90 Base) MCG/ACT inhaler, Inhale 2 puffs into the lungs every 6 (six) hours as needed for wheezing  or shortness of breath., Disp: , Rfl:  ?  alendronate (FOSAMAX) 70 MG tablet, Take 70 mg by mouth every Saturday., Disp: , Rfl: 11 ?  carvedilol (COREG) 25 MG tablet, Take 25 mg by mouth 2 (two) times daily with a meal., Disp: , Rfl:  ?  celecoxib (CELEBREX) 200 MG capsule, Take once to twice daily as needed for shoulder pain., Disp: 30 capsule, Rfl: 0 ?  diclofenac Sodium (VOLTAREN) 1 % GEL, Apply to right shoulder once or twice a day, Disp: 100 g, Rfl: 3 ?  dorzolamide (TRUSOPT) 2 % ophthalmic solution, Place 1 drop into both eyes 2 (two) times daily., Disp: , Rfl:  ?  furosemide (LASIX) 20 MG tablet, Take 20 mg by mouth  daily., Disp: , Rfl:  ?  gabapentin (NEURONTIN) 300 MG capsule, Take 1 capsule in the morning, 1 capsules in the afternoon and 2 capsules at night., Disp: 360 capsule, Rfl: 3 ?  glucose blood test strip, Use 1 test strip to check blood sugar twice daily, Disp: , Rfl:  ?  HYDROcodone-acetaminophen (NORCO) 7.5-325 MG tablet, Take 1 tablet by mouth 2 (two) times daily., Disp: 60 tablet, Rfl: 0 ?  Insulin Degludec (TRESIBA) 100 UNIT/ML SOLN, Inject 40 Units into the skin at bedtime. (Patient taking differently: Inject 25 Units into the skin at bedtime.), Disp: , Rfl:  ?  ketorolac (ACULAR) 0.5 % ophthalmic solution, Place 1 drop into the left eye in the morning, at noon, in the evening, and at bedtime., Disp: , Rfl:  ?  losartan (COZAAR) 100 MG tablet, Take 100 mg by mouth daily., Disp: , Rfl:  ?  nitrofurantoin, macrocrystal-monohydrate, (MACROBID) 100 MG capsule, Take 100 mg by mouth 2 (two) times daily., Disp: , Rfl:  ?  predniSONE (DELTASONE) 5 MG tablet, Take 5 mg by mouth 2 (two) times daily with a meal., Disp: , Rfl:  ? ?PAST MEDICAL HISTORY: ?Past Medical History:  ?Diagnosis Date  ? Asthma   ? Diabetes mellitus without complication ([place])   ? Hypertension   ? Lupus (systemic lupus erythematosus) ([place])   ? Neuropathy   ? Raynaud's disease   ? Vertebral fracture, osteoporotic ([place])   ? ? ?PAST SURGICAL HISTORY: ?Past Surgical History:  ?Procedure Laterality Date  ? ABDOMINAL HYSTERECTOMY    ? TONSILLECTOMY    ? ? ?FAMILY HISTORY: ?History reviewed. No pertinent family history. ? ?SOCIAL HISTORY: ? ?Social History  ? ?Socioeconomic History  ? Marital status: Divorced  ?  Spouse name: Not on file  ? Number of children: Not on file  ? Years of education: Not on file  ? Highest education level: Not on file  ?Occupational History  ? Not on file  ?Tobacco Use  ? Smoking status: Never  ? Smokeless tobacco: Never  ?Substance and Sexual Activity  ? Alcohol use: No  ?  Alcohol/week: 0.0 standard drinks  ? Drug use: No  ?  Sexual activity: Not on file  ?Other Topics Concern  ? Not on file  ?Social History Narrative  ? Not on file  ? ?Social Determinants of Health  ? ?Financial Resource Strain: Not on file  ?Food Insecurity: Not on file  ?Transportation Needs: Not on file  ?Physical Activity: Not on file  ?Stress: Not on file  ?Social Connections: Not on file  ?Intimate Partner Violence: Not on file  ? ? ? ?PHYSICAL EXAM ? ?Vitals:  ? 05/30/21 1114  ?BP: 138/64  ?Pulse: 77  ?Weight: 187 lb (84.8 kg)  ?  Height: 5\' 3"  (1.6 m)  ? ? ?Body mass index is 33.13 kg/m?. ? ? ?General: The patient is well-developed and well-nourished and in no acute distress.  ? ?Neck/Musculoskeletal::   Moderate tenderness over right glenohumeral joint and mild over subacromial bursa.   Mild tenderness in the cervical paraspinal muscles and trapezius muscles.   Reduced ROM in right shoulder.   Only mild piriformis tendernsss.   Has mild edema at ankles.   ? ?Neurologic Exam ? ?Mental status: The patient is alert and oriented x 3 at the time of the examination. The patient has apparent normal recent and remote memory, with an apparently normal attention span and concentration ability.   Speech is normal. ? ?Cranial nerves: Extraocular movements are full.   Facial strength and sensation is normal.  Trapezius strength is normal. ? ?Motor:  Muscle bulk is normal.   Tone is normal. Strength is  5 / 5 in all 4 extremities.  ? ?Sensory: Intact sensation to touch in the arms and proximal legs.  Mildly reduced vibration sensation in feet ? ?Gait and station: She needs to use her arms to stand up.  The gait is arthritic and wide. She does better with support.  Romberg is negative ? ?Reflexes: Deep tendon reflexes are symmetric and 1 in arms and absent in legs bilaterally.    ? ? ____________________________________________ ? ?ASSESSMENT AND PLAN ? ?  ?1. Right shoulder pain, unspecified chronicity   ?2. Bilateral sciatica   ?3. Neck pain   ?4. Systemic lupus erythematosus  with other organ involvement, unspecified SLE type ([place])   ?5. Type 2 diabetes mellitus with diabetic polyneuropathy, with long-term current use of insulin ([place])   ? ? ? ?1.    If shoulder pain worsens she shou

## 2021-06-18 ENCOUNTER — Other Ambulatory Visit: Payer: Self-pay | Admitting: Neurology

## 2021-06-18 MED ORDER — HYDROCODONE-ACETAMINOPHEN 7.5-325 MG PO TABS: 1.0000 | ORAL_TABLET | Freq: Two times a day (BID) | ORAL | 0 refills | Status: DC

## 2021-06-18 NOTE — Telephone Encounter (Signed)
Pt requesting refill for HYDROcodone-acetaminophen (NORCO) 7.5-325 MG tablet at [id number] ?

## 2021-07-18 ENCOUNTER — Other Ambulatory Visit: Payer: Self-pay | Admitting: Neurology

## 2021-07-18 MED ORDER — GABAPENTIN 300 MG PO CAPS
ORAL_CAPSULE | ORAL | 3 refills | Status: AC
Start: 2021-07-18 — End: ?

## 2021-07-18 MED ORDER — HYDROCODONE-ACETAMINOPHEN 7.5-325 MG PO TABS
1.0000 | ORAL_TABLET | Freq: Two times a day (BID) | ORAL | 0 refills | Status: DC
Start: 2021-07-18 — End: 2021-08-23

## 2021-07-18 NOTE — Telephone Encounter (Signed)
Pt called needing a refill request for her HYDROcodone-acetaminophen ([place]) 7.5-325 MG tablet [facility].  Pt is also needing nurse to call her back to discuss her gabapentin (NEURONTIN) 300 MG capsule. She states she was in the hospital and they made changes to the gabapentin (NEURONTIN) 300 MG capsule and she is needing to see if the changes can be done to her Rx.

## 2021-07-18 NOTE — Telephone Encounter (Signed)
Checked drug registry. Last refilled 06/18/21 #60. Last seen 05/30/21 and next f/u 11/14/21.  Reviewed pt chart. Per last note from 05/30/21, she should be taking gabapentin 300mg , 1 in the morning, 1 in the afternoon and 2 at bedtime. I called pt. She needs refill for this. [facility].

## 2021-08-23 ENCOUNTER — Other Ambulatory Visit: Payer: Self-pay | Admitting: Neurology

## 2021-08-23 MED ORDER — HYDROCODONE-ACETAMINOPHEN 7.5-325 MG PO TABS: 1.0000 | ORAL_TABLET | Freq: Two times a day (BID) | ORAL | 0 refills | Status: DC

## 2021-08-23 NOTE — Telephone Encounter (Signed)
Pt  request refill for HYDROcodone-acetaminophen (NORCO) 7.5-325 MG tablet at [id number]

## 2021-09-25 ENCOUNTER — Other Ambulatory Visit: Payer: Self-pay | Admitting: Neurology

## 2021-09-25 MED ORDER — HYDROCODONE-ACETAMINOPHEN 7.5-325 MG PO TABS
1.0000 | ORAL_TABLET | Freq: Two times a day (BID) | ORAL | 0 refills | Status: DC
Start: 2021-09-25 — End: 2021-10-08

## 2021-09-25 NOTE — Addendum Note (Signed)
Addended by: Arther Abbott on: 09/25/2021 09:18 AM   Modules accepted: Orders

## 2021-09-25 NOTE — Telephone Encounter (Signed)
Pt is calling and requesting a refill on HYDROcodone-acetaminophen (NORCO) 7.5-325 MG tablet. Pt would like for prescription to be sent [id number]

## 2021-10-08 ENCOUNTER — Other Ambulatory Visit: Payer: Self-pay | Admitting: Neurology

## 2021-10-08 MED ORDER — HYDROCODONE-ACETAMINOPHEN 7.5-325 MG PO TABS
1.0000 | ORAL_TABLET | Freq: Two times a day (BID) | ORAL | 0 refills | Status: DC
Start: 2021-10-08 — End: 2021-11-14

## 2021-10-08 MED ORDER — HYDROCODONE-ACETAMINOPHEN 7.5-325 MG PO TABS
1.0000 | ORAL_TABLET | Freq: Two times a day (BID) | ORAL | 0 refills | Status: DC
Start: 2021-10-08 — End: 2021-10-08

## 2021-10-08 NOTE — Addendum Note (Signed)
Addended by: Despina Arias A on: 10/08/2021 04:40 PM   Modules accepted: Orders

## 2021-10-08 NOTE — Telephone Encounter (Signed)
Pt reports that her pharmacy has informed her that re: her HYDROcodone-acetaminophen (NORCO) 7.5-325 MG tablet they do not have that strength.  They only have :10.253MG , pt has been without the medication, she is asking if this 10.253 MG can be called into [id number] as quickly as possible.

## 2021-11-14 ENCOUNTER — Ambulatory Visit (INDEPENDENT_AMBULATORY_CARE_PROVIDER_SITE_OTHER): Payer: Medicare Other | Admitting: Neurology

## 2021-11-14 ENCOUNTER — Encounter: Payer: Self-pay | Admitting: Neurology

## 2021-11-14 VITALS — BP 174/89 | HR 111 | Ht 63.0 in | Wt 195.9 lb

## 2021-11-14 DIAGNOSIS — M25512 Pain in left shoulder: Secondary | ICD-10-CM

## 2021-11-14 DIAGNOSIS — M542 Cervicalgia: Secondary | ICD-10-CM

## 2021-11-14 DIAGNOSIS — M3219 Other organ or system involvement in systemic lupus erythematosus: Secondary | ICD-10-CM

## 2021-11-14 DIAGNOSIS — M5431 Sciatica, right side: Secondary | ICD-10-CM

## 2021-11-14 DIAGNOSIS — E1142 Type 2 diabetes mellitus with diabetic polyneuropathy: Secondary | ICD-10-CM | POA: Diagnosis not present

## 2021-11-14 DIAGNOSIS — Z794 Long term (current) use of insulin: Secondary | ICD-10-CM

## 2021-11-14 DIAGNOSIS — M25511 Pain in right shoulder: Secondary | ICD-10-CM

## 2021-11-14 DIAGNOSIS — M5432 Sciatica, left side: Secondary | ICD-10-CM

## 2021-11-14 DIAGNOSIS — G8929 Other chronic pain: Secondary | ICD-10-CM

## 2021-11-14 MED ORDER — HYDROCODONE-ACETAMINOPHEN 7.5-325 MG PO TABS
1.0000 | ORAL_TABLET | Freq: Two times a day (BID) | ORAL | 0 refills | Status: DC
Start: 2021-11-14 — End: 2021-12-17

## 2021-11-14 NOTE — Progress Notes (Signed)
[facility]  PATIENT: Kerry Brooks DOB: [birth year]  REFERRING DOCTOR OR PCP:  Brooke Bonito  SOURCE: patient  _________________________________   HISTORICAL  CHIEF COMPLAINT:  Chief Complaint  Patient presents with   Follow-up    RM 2, alone. Last seen 05/30/21. In [facility] in office today. Able to stand w/ 2 assist to obtain weight. Has swelling in feet. PCP doing further work up to check kidney function.    HISTORY OF PRESENT ILLNESS:  Kerry Brooks is a 81 y.o. woman with LBP, left shoulder pain and knee pain.    11/14/2021: She is doing worse with more neck and arm pain (R>L).   She has reduced strength in arms.    The back pain and leg pain are about the same.    For pain she is on hydrocodone and gabapentin.  Current hydrocodone dose is 7.5 mg twice a day.  She is on gabapentin for pain.   Due to reduced GFR her gabapentin dose was reduced and is now 300-300-600 mg.  She has had frequent UTI and sees Dr. Cleatrice Burke.  She has intramural cystitis (cystoscopy)  She has back pain, worse when standing    She is reporting pain in both the lower lumbar region and piriformis region.   TPIs in the lumbar paraspinal muscles and/or piriformis muscle in the past helped the pain when it was more severe.  She has osteoporosis and we discussed that steroids might worsen this.  We have done TPI or joint injections (with steroid) about 3 times a year and she is on daily Flonase as well.   She is on alendronate weekly.      She is on hydrocodone 7.5 mg bid for her chronic musculoskeletal pain  This has helped the most.  She has no drug seeking behavior.   She also has a benefit from Voltaren gel.    She also has SLE and is on prednisone   She also has DM and reports sugars have been generally well controlled this month.    MRI cervical 07/31/2017 ([facility]) IMPRESSION:  1. Minimal grade 1 C4-5 anterolisthesis on a degenerative basis. No  acute osseous process.  2. Mild  low-grade upper cervical paraspinal muscle strain.  3. Mild canal stenosis at C5-6 in part due to 3 x 4 x 6 mm  contiguous disc extrusion which mildly deforms the ventral spinal  cord. Mild canal stenosis C3-4.  4. Neural foraminal narrowing C3-4 through C6-7: Severe on the left  at C3-4, severe on the right at C4-5.   MRI brain 11/07/2017 showed chronic ischemic changes and atrophy    REVIEW OF SYSTEMS: Constitutional: No fevers, chills, sweats, or change in appetite Eyes: No visual changes, double vision, eye pain Ear, nose and throat: No hearing loss, ear pain, nasal congestion, sore throat Cardiovascular: No chest pain, palpitations Respiratory:  No shortness of breath at rest or with exertion.   No wheezes GastrointestinaI: No nausea, vomiting, diarrhea, abdominal pain, fecal incontinence Genitourinary:  No dysuria, urinary retention or frequency.  No nocturia. Musculoskeletal:  neck pain.  Notes shoulder pain and bilat back pain Integumentary: No rash, pruritus, skin lesions Neurological: as above Psychiatric: No depression at this time.  No anxiety Endocrine: No palpitations, diaphoresis, change in appetite, change in weigh or increased thirst Hematologic/Lymphatic:  No anemia, purpura, petechiae. Allergic/Immunologic: No itchy/runny eyes, nasal congestion, recent allergic reactions, rashes  ALLERGIES: Allergies  Allergen Reactions   Canagliflozin Other (See Comments)    Unknown  reaction to Invokana   Propoxyphene Other (See Comments)    Unknown reaction to Darvocet    HOME MEDICATIONS:  Current Outpatient Medications:    albuterol (VENTOLIN HFA) 108 (90 Base) MCG/ACT inhaler, Inhale 2 puffs into the lungs every 6 (six) hours as needed for wheezing or shortness of breath., Disp: , Rfl:    alendronate (FOSAMAX) 70 MG tablet, Take 70 mg by mouth every Saturday., Disp: , Rfl: 11   carvedilol (COREG) 25 MG tablet, Take 25 mg by mouth 2 (two) times daily with a meal., Disp: ,  Rfl:    celecoxib (CELEBREX) 200 MG capsule, Take once to twice daily as needed for shoulder pain., Disp: 30 capsule, Rfl: 0   diclofenac Sodium (VOLTAREN) 1 % GEL, Apply to right shoulder once or twice a day, Disp: 100 g, Rfl: 3   dorzolamide (TRUSOPT) 2 % ophthalmic solution, Place 1 drop into both eyes 2 (two) times daily., Disp: , Rfl:    furosemide (LASIX) 20 MG tablet, Take 20 mg by mouth daily., Disp: , Rfl:    gabapentin (NEURONTIN) 300 MG capsule, Take 1 capsule in the morning, 1 capsules in the afternoon and 2 capsules at night., Disp: 360 capsule, Rfl: 3   glucose blood test strip, Use 1 test strip to check blood sugar twice daily, Disp: , Rfl:    Insulin Degludec (TRESIBA) 100 UNIT/ML SOLN, Inject 40 Units into the skin at bedtime. (Patient taking differently: Inject 25 Units into the skin at bedtime.), Disp: , Rfl:    ketorolac (ACULAR) 0.5 % ophthalmic solution, Place 1 drop into the left eye in the morning, at noon, in the evening, and at bedtime., Disp: , Rfl:    losartan (COZAAR) 100 MG tablet, Take 100 mg by mouth daily., Disp: , Rfl:    nitrofurantoin, macrocrystal-monohydrate, (MACROBID) 100 MG capsule, Take 100 mg by mouth 2 (two) times daily., Disp: , Rfl:    predniSONE (DELTASONE) 5 MG tablet, Take 5 mg by mouth 2 (two) times daily with a meal., Disp: , Rfl:    HYDROcodone-acetaminophen (NORCO) 7.5-325 MG tablet, Take 1 tablet by mouth 2 (two) times daily., Disp: 60 tablet, Rfl: 0  PAST MEDICAL HISTORY: Past Medical History:  Diagnosis Date   Asthma    Diabetes mellitus without complication ([place])    Hypertension    Lupus (systemic lupus erythematosus) ([place])    Neuropathy    Raynaud's disease    Vertebral fracture, osteoporotic ([place])     PAST SURGICAL HISTORY: Past Surgical History:  Procedure Laterality Date   ABDOMINAL HYSTERECTOMY     TONSILLECTOMY      FAMILY HISTORY: No family history on file.  SOCIAL HISTORY:  Social History   Socioeconomic History    Marital status: Divorced    Spouse name: Not on file   Number of children: Not on file   Years of education: Not on file   Highest education level: Not on file  Occupational History   Not on file  Tobacco Use   Smoking status: Never   Smokeless tobacco: Never  Substance and Sexual Activity   Alcohol use: No    Alcohol/week: 0.0 standard drinks of alcohol   Drug use: No   Sexual activity: Not on file  Other Topics Concern   Not on file  Social History Narrative   Not on file   Social Determinants of Health   Financial Resource Strain: Not on file  Food Insecurity: Not on file  Transportation Needs: Not  on file  Physical Activity: Not on file  Stress: Not on file  Social Connections: Not on file  Intimate Partner Violence: Not on file     PHYSICAL EXAM  Vitals:   11/14/21 1011  BP: (!) 174/89  Pulse: (!) 111  Weight: 195 lb 14.1 oz (88.9 kg)  Height: 5\' 3"  (1.6 m)    Body mass index is 34.7 kg/m.   General: The patient is well-developed and well-nourished and in no acute distress.   Neck/Musculoskeletal/Ext::   She has tenderness over the right subacromial bursa and the glenohumeral joint.  She also has tenderness in the cervical paraspinal muscles, right greater than left.  There is reduced range of motion of the shoulders, right worse than left.  She also has some tenderness in the lower back and piriformis muscle region.    Has edema at ankles.    Neurologic Exam  Mental status: The patient is alert and oriented x 3 at the time of the examination. The patient has apparent normal recent and remote memory, with an apparently normal attention span and concentration ability.   Speech is normal.  Cranial nerves: Extraocular movements are full.   Facial strength and sensation is normal.  Trapezius strength is normal.  Motor:  Muscle bulk is normal.   Tone is normal. Strength is  5 / 5 in all 4 extremities.   Sensory: Intact sensation to touch in the arms and  proximal legs.  Mildly reduced vibration sensation in feet but near normal sensation at the ankles  Gait and station: She needs to use her arms to stand up.  The gait requires support.  Romberg is negative  Reflexes: Deep tendon reflexes are symmetric and 1 in arms and absent in legs bilaterally.      ____________________________________________  ASSESSMENT AND PLAN    1. Bilateral sciatica   2. Systemic lupus erythematosus with other organ involvement, unspecified SLE type ([facility])   3. Neck pain   4. Type 2 diabetes mellitus with diabetic polyneuropathy, with long-term current use of insulin ([facility])     1.   Due to osteoporosis, chronic prednisone use and infections I'll avoid a TPI today.  2.  Continue hydrocodone #60/month.  The PDMP was queried and she only gets opiates from our office.  Continue gabapentin 300-300-600 mg  .     3    Advised to wear TED hose for edema.    4.   She will return to see me in 6 months,  sooner if she has new or worsening neurologic symptoms.   Jamey Harman A. [phone], MD, PhD 11/14/2021, 10:57 AM Certified in Neurology, Clinical Neurophysiology, Sleep Medicine, Pain Medicine and Neuroimaging  [facility] [address] [place], [place] [phone] p

## 2021-12-17 ENCOUNTER — Other Ambulatory Visit: Payer: Self-pay | Admitting: Neurology

## 2021-12-17 MED ORDER — HYDROCODONE-ACETAMINOPHEN 7.5-325 MG PO TABS: 1.0000 | ORAL_TABLET | Freq: Two times a day (BID) | ORAL | 0 refills | Status: AC

## 2021-12-17 NOTE — Telephone Encounter (Signed)
Pt is calling. Requesting a refill on medication  HYDROcodone-acetaminophen (NORCO) 7.5-325 MG tablet. Refill should be sent to  [id number].

## 2022-04-26 DEATH — deceased

## 2022-05-15 ENCOUNTER — Telehealth: Payer: Self-pay | Admitting: Neurology

## 2022-05-15 NOTE — Telephone Encounter (Signed)
Pt passed away .

## 2022-05-22 ENCOUNTER — Ambulatory Visit: Payer: Self-pay | Admitting: Neurology

## 2023-06-14 IMAGING — DX DG SHOULDER 2+V*R*
2 series · 2 of 2 positions shown · non-contrast
Comparison: 11/07/2017

CLINICAL DATA: Fall 2 months ago with persistent shoulder
immobility.

EXAM:
RIGHT SHOULDER - 2+ VIEW

[shoulder axial]
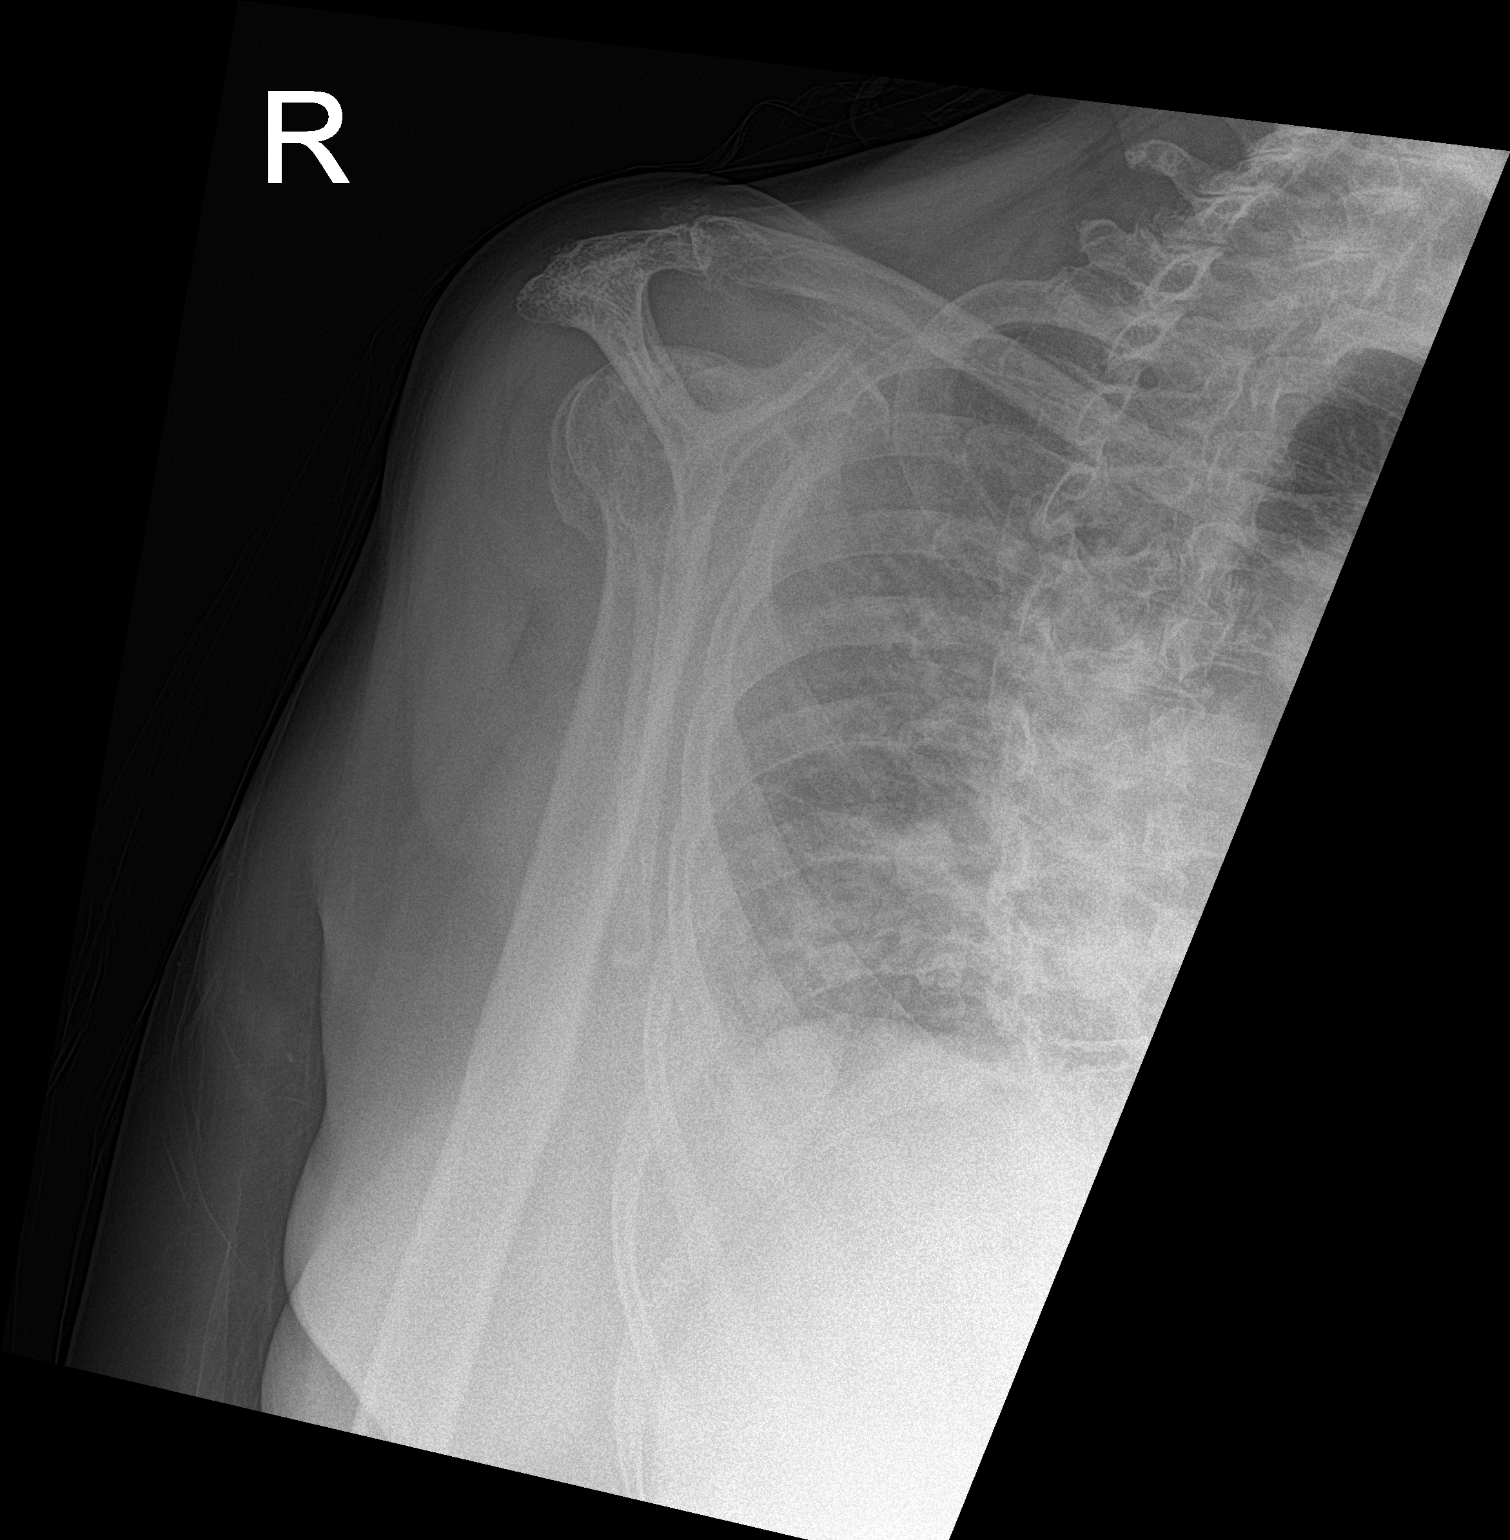

[shoulder ap]
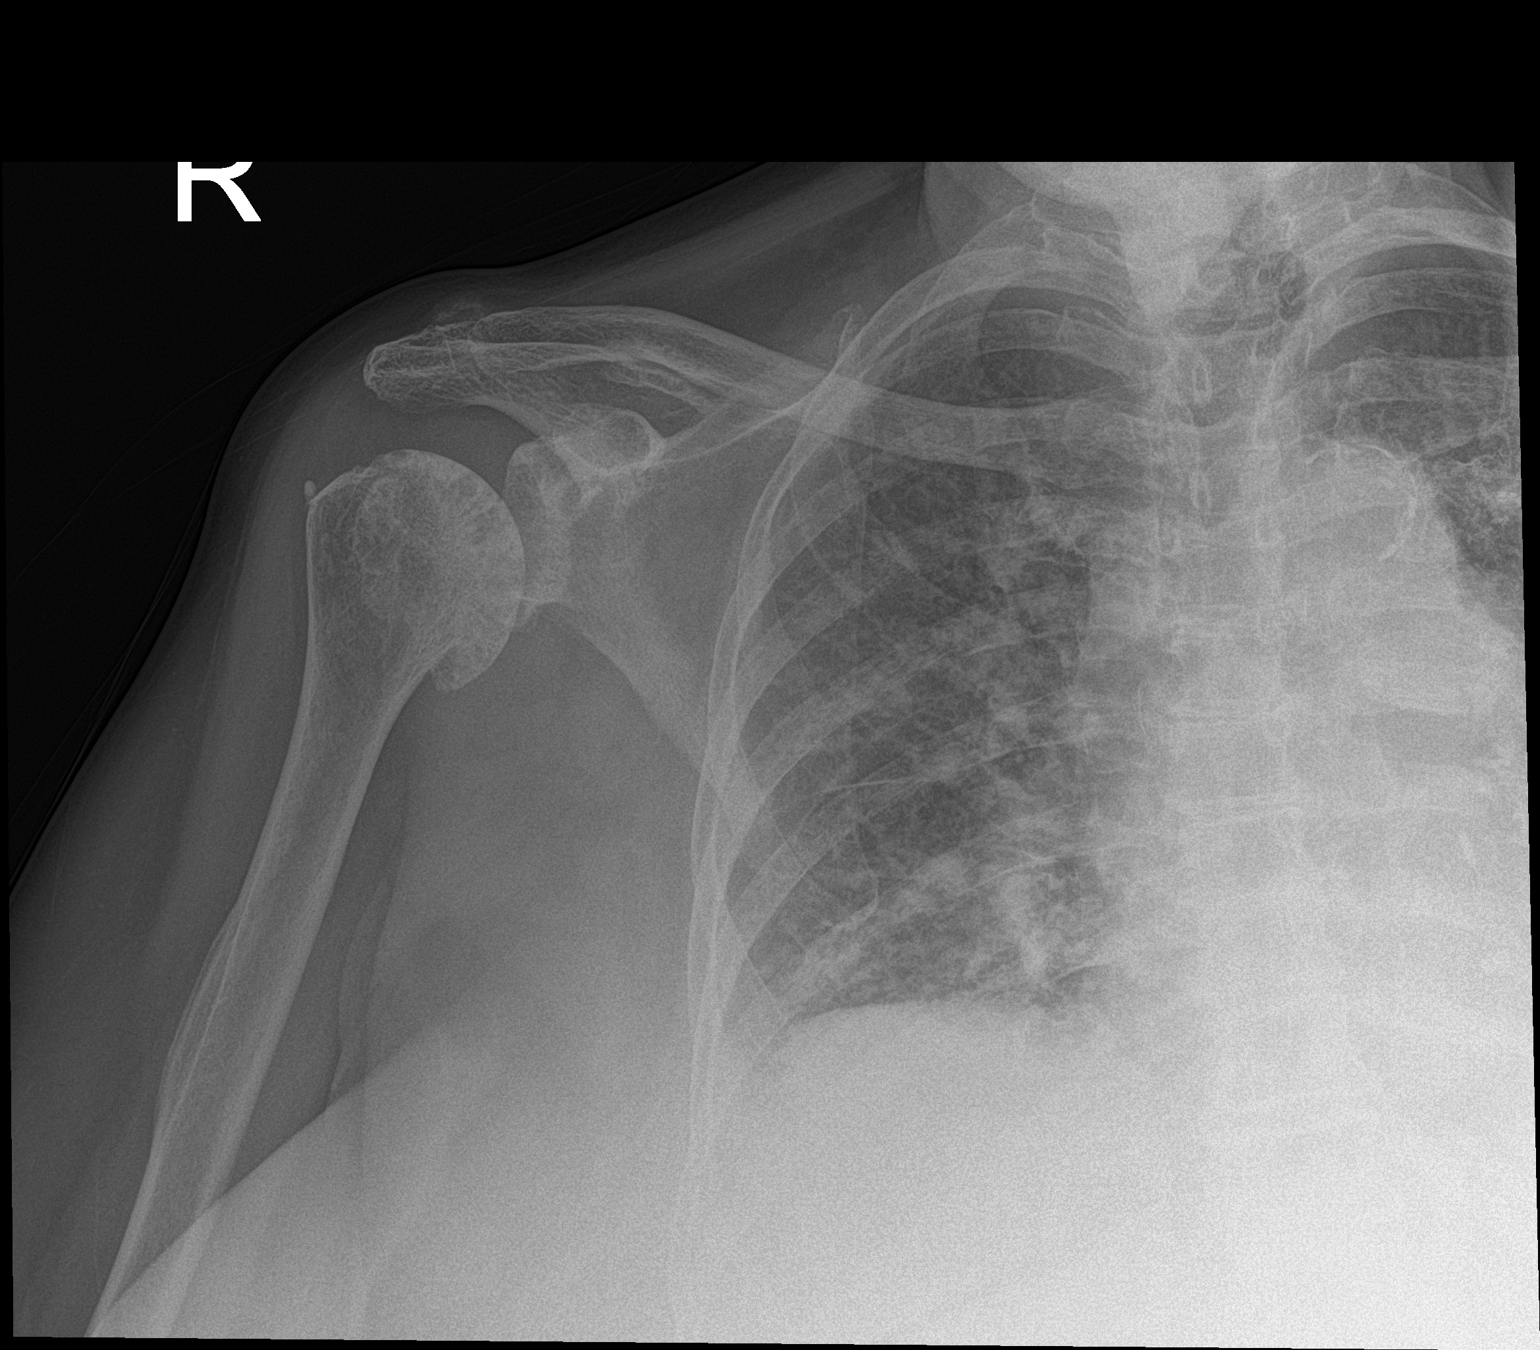

[2 of 2 positions shown; findings below may reference images not displayed]

FINDINGS: No evidence for an acute fracture. No findings to suggest shoulder
separation or dislocation. Degenerative changes are seen at the
acromioclavicular joint. Advanced degenerative changes are noted in
the glenohumeral joint. Visualized portions of the right lung show
diffuse interstitial opacity.
IMPRESSION: 1. No acute bony abnormality.
2. Advanced degenerative changes in the glenohumeral joint.
3. Diffuse interstitial opacity in the right lung.
